# Patient Record
Sex: Female | Born: 1950 | Race: White | Hispanic: No | Marital: Married | Smoking: Never smoker
Health system: Southern US, Community
[De-identification: ages and names within clinical notes are randomized; demographics above are authoritative.]

## PROBLEM LIST (undated history)

## (undated) DIAGNOSIS — G8929 Other chronic pain: Secondary | ICD-10-CM

## (undated) DIAGNOSIS — E785 Hyperlipidemia, unspecified: Secondary | ICD-10-CM

## (undated) DIAGNOSIS — M542 Cervicalgia: Secondary | ICD-10-CM

## (undated) DIAGNOSIS — I10 Essential (primary) hypertension: Secondary | ICD-10-CM

## (undated) DIAGNOSIS — K5790 Diverticulosis of intestine, part unspecified, without perforation or abscess without bleeding: Secondary | ICD-10-CM

## (undated) DIAGNOSIS — N951 Menopausal and female climacteric states: Secondary | ICD-10-CM

## (undated) HISTORY — PX: TUBAL LIGATION: SHX77

## (undated) HISTORY — DX: Menopausal and female climacteric states: N95.1

## (undated) HISTORY — PX: HAND SURGERY: SHX662

## (undated) HISTORY — DX: Hyperlipidemia, unspecified: E78.5

## (undated) HISTORY — DX: Diverticulosis of intestine, part unspecified, without perforation or abscess without bleeding: K57.90

## (undated) HISTORY — DX: Other chronic pain: G89.29

## (undated) HISTORY — DX: Essential (primary) hypertension: I10

## (undated) HISTORY — DX: Cervicalgia: M54.2

---

## 1981-03-30 HISTORY — PX: APPENDECTOMY: SHX54

## 1981-03-30 HISTORY — PX: KNEE ARTHROSCOPY: SUR90

## 1984-03-30 HISTORY — PX: ABDOMINAL HYSTERECTOMY: SHX81

## 1993-03-30 HISTORY — PX: CHOLECYSTECTOMY: SHX55

## 1994-03-30 HISTORY — PX: HEEL SPUR SURGERY: SHX665

## 2009-01-08 ENCOUNTER — Encounter: Admission: RE | Admit: 2009-01-08 | Discharge: 2009-01-08 | Payer: Self-pay | Admitting: Family Medicine

## 2009-02-11 ENCOUNTER — Ambulatory Visit: Payer: Self-pay | Admitting: Family Medicine

## 2009-02-11 DIAGNOSIS — J45991 Cough variant asthma: Secondary | ICD-10-CM | POA: Insufficient documentation

## 2009-02-11 DIAGNOSIS — I10 Essential (primary) hypertension: Secondary | ICD-10-CM | POA: Insufficient documentation

## 2009-02-11 DIAGNOSIS — F324 Major depressive disorder, single episode, in partial remission: Secondary | ICD-10-CM | POA: Insufficient documentation

## 2009-02-18 ENCOUNTER — Telehealth: Payer: Self-pay | Admitting: Family Medicine

## 2009-03-26 ENCOUNTER — Ambulatory Visit: Payer: Self-pay | Admitting: Family Medicine

## 2009-03-27 ENCOUNTER — Telehealth (INDEPENDENT_AMBULATORY_CARE_PROVIDER_SITE_OTHER): Payer: Self-pay | Admitting: *Deleted

## 2009-04-01 ENCOUNTER — Telehealth: Payer: Self-pay | Admitting: Family Medicine

## 2009-04-12 ENCOUNTER — Encounter: Payer: Self-pay | Admitting: Family Medicine

## 2009-04-16 ENCOUNTER — Telehealth: Payer: Self-pay | Admitting: Family Medicine

## 2009-04-16 DIAGNOSIS — R05 Cough: Secondary | ICD-10-CM

## 2009-04-16 DIAGNOSIS — R059 Cough, unspecified: Secondary | ICD-10-CM | POA: Insufficient documentation

## 2009-04-17 ENCOUNTER — Encounter: Admission: RE | Admit: 2009-04-17 | Discharge: 2009-04-17 | Payer: Self-pay | Admitting: Family Medicine

## 2009-04-19 ENCOUNTER — Encounter: Payer: Self-pay | Admitting: Family Medicine

## 2009-04-23 ENCOUNTER — Ambulatory Visit: Payer: Self-pay | Admitting: Family Medicine

## 2009-05-03 ENCOUNTER — Telehealth: Payer: Self-pay | Admitting: Family Medicine

## 2009-05-03 ENCOUNTER — Encounter: Payer: Self-pay | Admitting: Family Medicine

## 2009-05-06 ENCOUNTER — Telehealth: Payer: Self-pay | Admitting: Family Medicine

## 2009-05-27 ENCOUNTER — Telehealth: Payer: Self-pay | Admitting: Family Medicine

## 2009-05-29 ENCOUNTER — Telehealth (INDEPENDENT_AMBULATORY_CARE_PROVIDER_SITE_OTHER): Payer: Self-pay | Admitting: *Deleted

## 2009-05-30 ENCOUNTER — Ambulatory Visit: Payer: Self-pay | Admitting: Critical Care Medicine

## 2009-05-30 ENCOUNTER — Encounter: Payer: Self-pay | Admitting: Family Medicine

## 2009-05-30 DIAGNOSIS — Z9109 Other allergy status, other than to drugs and biological substances: Secondary | ICD-10-CM | POA: Insufficient documentation

## 2009-05-30 DIAGNOSIS — J309 Allergic rhinitis, unspecified: Secondary | ICD-10-CM | POA: Insufficient documentation

## 2009-05-30 DIAGNOSIS — K219 Gastro-esophageal reflux disease without esophagitis: Secondary | ICD-10-CM | POA: Insufficient documentation

## 2009-05-31 ENCOUNTER — Encounter: Payer: Self-pay | Admitting: Critical Care Medicine

## 2009-07-04 ENCOUNTER — Telehealth: Payer: Self-pay | Admitting: Critical Care Medicine

## 2009-07-23 ENCOUNTER — Ambulatory Visit: Payer: Self-pay | Admitting: Family Medicine

## 2009-07-23 DIAGNOSIS — Z780 Asymptomatic menopausal state: Secondary | ICD-10-CM | POA: Insufficient documentation

## 2009-08-29 ENCOUNTER — Telehealth: Payer: Self-pay | Admitting: Family Medicine

## 2009-09-06 ENCOUNTER — Encounter: Payer: Self-pay | Admitting: Family Medicine

## 2009-10-08 ENCOUNTER — Encounter: Admission: RE | Admit: 2009-10-08 | Discharge: 2009-10-08 | Payer: Self-pay | Admitting: Family Medicine

## 2009-10-08 ENCOUNTER — Encounter: Payer: Self-pay | Admitting: Family Medicine

## 2009-10-16 ENCOUNTER — Telehealth: Payer: Self-pay | Admitting: Family Medicine

## 2009-10-16 DIAGNOSIS — R300 Dysuria: Secondary | ICD-10-CM | POA: Insufficient documentation

## 2009-10-17 ENCOUNTER — Encounter: Payer: Self-pay | Admitting: Family Medicine

## 2009-10-18 LAB — CONVERTED CEMR LAB
AST: 41 units/L — ABNORMAL HIGH (ref 0–37)
Albumin: 4.9 g/dL (ref 3.5–5.2)
Alkaline Phosphatase: 97 units/L (ref 39–117)
BUN: 41 mg/dL — ABNORMAL HIGH (ref 6–23)
Bilirubin Urine: NEGATIVE
Creatinine, Ser: 1.74 mg/dL — ABNORMAL HIGH (ref 0.40–1.20)
HDL: 54 mg/dL (ref >39)
Ketones, ur: NEGATIVE mg/dL
LDL Cholesterol: 162 mg/dL — ABNORMAL HIGH (ref 0–99)
Nitrite: NEGATIVE
Potassium: 4.2 meq/L (ref 3.5–5.3)
Specific Gravity, Urine: 1.013 (ref 1.005–1.030)
Urobilinogen, UA: 0.2 (ref 0.0–1.0)
VLDL: 25 mg/dL (ref 0–40)
pH: 5.5 (ref 5.0–8.0)

## 2009-11-01 ENCOUNTER — Telehealth: Payer: Self-pay | Admitting: Family Medicine

## 2009-11-06 ENCOUNTER — Telehealth (INDEPENDENT_AMBULATORY_CARE_PROVIDER_SITE_OTHER): Payer: Self-pay | Admitting: *Deleted

## 2009-11-07 ENCOUNTER — Encounter: Payer: Self-pay | Admitting: Family Medicine

## 2009-11-08 LAB — CONVERTED CEMR LAB
BUN: 24 mg/dL — ABNORMAL HIGH (ref 6–23)
Creatinine, Ser: 1.5 mg/dL — ABNORMAL HIGH (ref 0.40–1.20)

## 2009-11-27 ENCOUNTER — Telehealth: Payer: Self-pay | Admitting: Family Medicine

## 2009-11-29 ENCOUNTER — Ambulatory Visit: Payer: Self-pay | Admitting: Family Medicine

## 2009-11-29 DIAGNOSIS — R748 Abnormal levels of other serum enzymes: Secondary | ICD-10-CM | POA: Insufficient documentation

## 2009-11-29 DIAGNOSIS — E785 Hyperlipidemia, unspecified: Secondary | ICD-10-CM | POA: Insufficient documentation

## 2009-11-30 ENCOUNTER — Encounter: Payer: Self-pay | Admitting: Family Medicine

## 2009-12-03 LAB — CONVERTED CEMR LAB
HCV Ab: NEGATIVE
Hep B Core Total Ab: NEGATIVE

## 2010-01-15 ENCOUNTER — Ambulatory Visit: Payer: Self-pay | Admitting: Family Medicine

## 2010-01-16 ENCOUNTER — Telehealth: Payer: Self-pay | Admitting: Family Medicine

## 2010-01-16 LAB — CONVERTED CEMR LAB: Creatinine, Ser: 1.22 mg/dL — ABNORMAL HIGH (ref 0.40–1.20)

## 2010-02-24 ENCOUNTER — Telehealth: Payer: Self-pay | Admitting: Family Medicine

## 2010-03-11 ENCOUNTER — Ambulatory Visit: Payer: Self-pay | Admitting: Family Medicine

## 2010-04-15 ENCOUNTER — Encounter: Payer: Self-pay | Admitting: Family Medicine

## 2010-04-15 ENCOUNTER — Telehealth (INDEPENDENT_AMBULATORY_CARE_PROVIDER_SITE_OTHER): Payer: Self-pay | Admitting: *Deleted

## 2010-04-29 NOTE — Progress Notes (Signed)
Summary: Cramps  Phone Note Call from Patient   Caller: Patient Summary of Call: Pt states she gets horrible cramps in feet and hands occasionally. Pt wanted to know if she can get anything OTC to help w/ this. Please advise.  Initial call taken by: Payton Spark CMA,  May 06, 2009 3:32 PM  Follow-up for Phone Call        She can take an OTC Magnesium oxide (OTC ) 400 mg at night. Follow-up by: Seymour Bars DO,  May 06, 2009 3:42 PM     Appended Document: Cramps pt aware

## 2010-04-29 NOTE — Progress Notes (Signed)
Summary: Vicoprofen refills  Phone Note Refill Request Message from:  Patient on August 29, 2009 12:04 PM  Refills Requested: Medication #1:  VICOPROFEN 7.5-200 MG TABS Take 1 tab every 8 hours as needed for pain Initial call taken by: Payton Spark CMA,  August 29, 2009 12:04 PM    Prescriptions: VICOPROFEN 7.5-200 MG TABS (HYDROCODONE-IBUPROFEN) Take 1 tab every 8 hours as needed for pain  #90 x 0   Entered and Authorized by:   Seymour Bars DO   Signed by:   Seymour Bars DO on 08/29/2009   Method used:   Print then Give to Patient   RxID:   [id number]   Appended Document: Vicoprofen refills called in

## 2010-04-29 NOTE — Miscellaneous (Signed)
Summary: Spirometry Jan 2011   Pulmonary Function Test Date: 04/23/2009 Height (in.): 64 Gender: Female  Pre-Spirometry FVC    Value: 2.96 L/min   Pred: 3.03 L/min     % Pred: 97 % FEV1    Value: 2.36 L     Pred: 2.45 L     % Pred: 96 % FEV1/FVC  Value: 80 %     Pred: 80 %     % Pred: 100 % FEF 25-75  Value: 2.24 L/min   Pred: 2.63 L/min     % Pred: 85 %  Comments: normal spirometry Clinical Lists Changes  Observations: Added new observation of PFT COMMENTS: normal spirometry (05/31/2009 17:12) Added new observation of FEF % EXPEC: 85 % (05/31/2009 17:12) Added new observation of FEF25-75%PRE: 2.63 L/min (05/31/2009 17:12) Added new observation of FEF 25-75%: 2.24 L/min (05/31/2009 17:12) Added new observation of FEV1/FVC%EXP: 100 % (05/31/2009 17:12) Added new observation of FEV1/FVC PRE: 80 % (05/31/2009 17:12) Added new observation of FEV1/FVC: 80 % (05/31/2009 17:12) Added new observation of FEV1 % EXP: 96 % (05/31/2009 17:12) Added new observation of FEV1 PREDICT: 2.45 L (05/31/2009 17:12) Added new observation of FEV1: 2.36 L (05/31/2009 17:12) Added new observation of FVC % EXPECT: 97 % (05/31/2009 17:12) Added new observation of FVC PREDICT: 3.03 L (05/31/2009 17:12) Added new observation of FVC: 2.96 L (05/31/2009 17:12) Added new observation of PFT HEIGHT: 64  (05/31/2009 17:12) Added new observation of PFT DATE: 04/23/2009  (05/31/2009 17:12)

## 2010-04-29 NOTE — Assessment & Plan Note (Signed)
Summary: CPE   Vital Signs:  Patient profile:   60 year old female Menstrual status:  hysterectomy Height:      64 inches Weight:      184 pounds BMI:     31.70 O2 Sat:      99 % on Room air Pulse rate:   72 / minute BP sitting:   103 / 60  (left arm) Cuff size:   regular  Vitals Entered By: Payton Spark CMA (July 23, 2009 1:40 PM)  O2 Flow:  Room air CC: CPE     Menstrual Status hysterectomy   Primary Care Provider:  Seymour Bars DO  CC:  CPE.  History of Present Illness: 60 yo WF presents for CPE w/o pap smear.  She is G1P1 postmenopausal and had a TAH for endometriosis.  Denies any postmenopausal vaginal bleeding or discharge.  Denies a fam hx of breast or colon cancer or of premature heart dz.  Doing well on current meds, enrolled in a HTN trial.  She is trying to eat better, is walking more.  She is due for fasting labs, colonoscopy, DEXA and mammogram.  She denies CP or DOE.  Not a smoker.    Current Medications (verified): 1)  Vicoprofen 7.5-200 Mg Tabs (Hydrocodone-Ibuprofen) .... Take 1 Tab Every 8 Hours As Needed For Pain 2)  Clonidine Hcl 0.1 Mg Tabs (Clonidine Hcl) .... Take 1 Tab At Bedtime 3)  Chlorthalidone 25 Mg Tabs (Chlorthalidone) .... Once Daily 4)  Chlorpheniramine Maleate 12 Mg Cr-Tabs (Chlorpheniramine Maleate) .... Take One By Mouth Two Times A Day 5)  Diovan 80 Mg Tabs (Valsartan) .... Once Daily 6)  Mag-Ox 400 400 Mg Tabs (Magnesium Oxide) .... At Bedtime 7)  Tessalon Perles 100 Mg  Caps (Benzonatate) .... One To Two By Mouth 3-4 Times Daily Generic Please 8)  Omeprazole 20 Mg  Cpdr (Omeprazole) .... By Mouth Daily. Take One Half Hour Before Eating. 9)  Nasonex 50 Mcg/act  Susp (Mometasone Furoate) .... Two Puffs Each Nostril Daily  Allergies (verified): 1)  ! Pcn 2)  ! * Ivp Dye 3)  ! Codeine 4)  ! Priscoline 5)  ! * Latex  Past History:  Past Medical History: Reviewed history from 02/11/2009 and no changes required. menopausal  syndrome chronic neck pain -- Dr Orvan Falconer pain doctor HTN asthma high chol   last mammo 3-09  Past Surgical History: BTL R knee arthroscopy 1983 TAH w/o BSO 1986 for endometriosis R knee arthroscopy 1986, 1990 L foot heel spur 1996 appy 1983 lap chole 1995  Family History: Reviewed history from 05/30/2009 and no changes required. mother alive, HTN daughter HTN father died at 47, throat cancer brother healthy emphysema-PGF clotting disorders-mother  Social History: Reviewed history from 05/30/2009 and no changes required. 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 Married to [place] (2nd marriage). Has a grown daughter in [place] and a teenage disabled granddaughter with spina bifida. Never smoked.  Rare ETOH. No exercise. Fair diet.  Review of Systems  The patient denies anorexia, fever, weight loss, weight gain, vision loss, decreased hearing, hoarseness, chest pain, syncope, dyspnea on exertion, peripheral edema, prolonged cough, headaches, hemoptysis, abdominal pain, melena, hematochezia, severe indigestion/heartburn, hematuria, incontinence, genital sores, muscle weakness, suspicious skin lesions, transient blindness, difficulty walking, depression, unusual weight change, abnormal bleeding, enlarged lymph nodes, angioedema, breast masses, and testicular masses.    Physical Exam  General:  alert, well-developed, well-nourished, well-hydrated, and overweight-appearing.   Head:  normocephalic and  atraumatic.   Eyes:  pupils equal, pupils round, and pupils reactive to light.   Ears:  no external deformities.   Nose:  no nasal discharge.   Mouth:  good dentition and pharynx pink and moist.   Neck:  no masses.   Breasts:  No mass, nodules, thickening, tenderness, bulging, retraction, inflamation, nipple discharge or skin changes noted.   Lungs:  Normal respiratory effort, chest expands symmetrically. Lungs are clear to auscultation, no crackles  or wheezes. Heart:  Normal rate and regular rhythm. S1 and S2 normal without gallop, murmur, click, rub or other extra sounds. Abdomen:  Bowel sounds positive,abdomen soft and non-tender without masses, organomegaly or hernias noted. Pulses:  2+ radial and pedal pulses Extremities:  no E/C/C Neurologic:  gait normal.   Skin:  color normal.   Cervical Nodes:  No lymphadenopathy noted Psych:  good eye contact, not anxious appearing, and not depressed appearing.     Impression & Recommendations:  Problem # 1:  Preventive Health Care (ICD-V70.0) Keeping healthy checklist for women reviewed. BP at goal on current meds. Update labs 6-1 (waiting for health insurance). Update mammogram, DEXA and colonoscopy. Tdap due next yr. BMI 31 c/w class I obesity.  Work on [employer], regular exercise, wt loss, MVI and calcium/ D.  Return in 6 mos for f/u HTN.  Complete Medication List: 1)  Vicoprofen 7.5-200 Mg Tabs (Hydrocodone-ibuprofen) .... Take 1 tab every 8 hours as needed for pain 2)  Clonidine Hcl 0.1 Mg Tabs (Clonidine hcl) .... Take 1 tab at bedtime 3)  Chlorthalidone 25 Mg Tabs (Chlorthalidone) .... Once daily 4)  Chlorpheniramine Maleate 12 Mg Cr-tabs (Chlorpheniramine maleate) .... Take one by mouth two times a day 5)  Diovan 80 Mg Tabs (Valsartan) .... Once daily 6)  Mag-ox 400 400 Mg Tabs (Magnesium oxide) .... At bedtime 7)  Tessalon Perles 100 Mg Caps (Benzonatate) .... One to two by mouth 3-4 times daily generic please 8)  Omeprazole 20 Mg Cpdr (Omeprazole) .... By mouth daily. take one half hour before eating. 9)  Nasonex 50 Mcg/act Susp (Mometasone furoate) .... Two puffs each nostril daily  Other Orders: Gastroenterology Referral (GI) T-Mammography Bilateral Screening (09811) T-DXA Bone Density/ Appendicular [phone]) T-Dual DXA Bone Density/ Axial (29562) T-Comprehensive Metabolic Panel ([id number]) T-Lipid Profile ([id number]) Prescriptions: CLONIDINE HCL 0.1 MG TABS  (CLONIDINE HCL) Take 1 tab at bedtime  #30 x 5   Entered and Authorized by:   Seymour Bars DO   Signed by:   Seymour Bars DO on 07/23/2009   Method used:   Electronically to        [facility] (retail)       [address]       [place], [place]  [address]       Ph: [phone]       Fax: [phone]   RxID:   [id number]    Tetanus/Td Immunization History:    Tetanus/Td # 1:  Historical (03/30/2000)

## 2010-04-29 NOTE — Progress Notes (Signed)
Summary: Vicoprofen Refill and Advair not helping  Phone Note Refill Request   Refills Requested: Medication #1:  VICOPROFEN 7.5-200 MG TABS Take 1 tab every 8 hours as needed for pain Initial call taken by: Payton Spark CMA,  April 01, 2009 11:29 AM Caller: Patient Summary of Call: Pt states the advair is not helping any longer and would like a different Rx.  Initial call taken by: Payton Spark CMA,  April 01, 2009 11:29 AM  Follow-up for Phone Call        I changed her Advair to Qvar.  Use twice daily and rinse mouth out after each use.  Call if not seeing improvements in 10 days. Follow-up by: Seymour Bars DO,  April 01, 2009 11:45 AM    New/Updated Medications: QVAR 80 MCG/ACT AERS (BECLOMETHASONE DIPROPIONATE) 1 inhalation two times a day Prescriptions: QVAR 80 MCG/ACT AERS (BECLOMETHASONE DIPROPIONATE) 1 inhalation two times a day  #1 x 1   Entered and Authorized by:   Seymour Bars DO   Signed by:   Seymour Bars DO on 04/01/2009   Method used:   Electronically to        [facility] (retail)       [address]       [place], [place]  [address]       Ph: [phone]       Fax: [phone]   RxID:   [id number] VICOPROFEN 7.5-200 MG TABS (HYDROCODONE-IBUPROFEN) Take 1 tab every 8 hours as needed for pain  #90 x 0   Entered and Authorized by:   Seymour Bars DO   Signed by:   Seymour Bars DO on 04/01/2009   Method used:   Printed then faxed to ...       [facility] (retail)       [address]       [place], [place]  [address]       Ph: [phone]       Fax: [phone]   RxID:   [id number]   Appended Document: Vicoprofen Refill and Advair not helping Pt aware

## 2010-04-29 NOTE — Progress Notes (Signed)
Summary: Lab order   

## 2010-04-29 NOTE — Assessment & Plan Note (Signed)
Summary: PFTs   Vital Signs:  Patient profile:   60 year old female Height:      63 inches Weight:      186 pounds BMI:     33.07 O2 Sat:      99 % on Room air Temp:     98.4 degrees F oral Pulse rate:   61 / minute BP sitting:   116 / 66  (left arm) Cuff size:   large  Vitals Entered By: Payton Spark CMA (April 23, 2009 2:33 PM)  O2 Flow:  Room air CC: Spirometry   Primary Care Provider:  Seymour Bars DO  CC:  Spirometry.  History of Present Illness: 60 yo WF presents for PFTs.  Her test results are normal.  She is in a study thru [facility] and she is on an ARB with this study.  She had labs done thru them.  She is doing better on Qvar two times a day.  She has not needed her Albuterol in the past 5 days.  She was taking Bactrim that she had at home and she claims that her cough did improve.  Her CXR was normal though.     Allergies: 1)  ! Pcn 2)  ! * Ivp Dye 3)  ! Codeine 4)  ! Priscoline 5)  ! * Latex  Past History:  Past Medical History: Reviewed history from 02/11/2009 and no changes required. menopausal syndrome chronic neck pain -- Dr Orvan Falconer pain doctor HTN asthma high chol   last mammo 3-09  Social History: Reviewed history from 02/11/2009 and no changes required. 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  No health insurance. Finished HS. Married to [place] (2nd marriage). Has a grown daughter in [place] and a teenage disabled granddaughter with spina bifida. Never smoked.  Rare ETOH. No exercise. Fair diet.  Review of Systems      See HPI  Physical Exam  General:  alert, well-developed, well-nourished, well-hydrated, and overweight-appearing.   Lungs:  Normal respiratory effort, chest expands symmetrically. Lungs are clear to auscultation, no crackles or wheezes.  Heart:  Normal rate and regular rhythm. S1 and S2 normal without gallop, murmur, click, rub or other extra sounds. Skin:  color normal.   Psych:  good eye contact, not anxious appearing, and  not depressed appearing.     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  Her FVC went from 97% predicted to 139% predicted after albuterol.  Her FEV1 went from 96% to 148% and her FEV1/FVC from 80 --> 86.  Symptomatically, the Qvar has helped.  I also think that adding Claritin daily has helped with the underlying cause of her asthma which is allergies.    Continue current meds.  If she flares again, I will get her in with allergist. We reviewed her normal CXR today.  I am not in support of her taking RX abx that she has 'leftover' for her cough. Her updated medication list for this problem includes:    Proair Hfa 108 (90 Base) Mcg/act Aers (Albuterol sulfate) .Marland Kitchen... 2 puffs q 4 hrs prn    Qvar 80 Mcg/act Aers (Beclomethasone dipropionate) .Marland Kitchen... 1 inhalation two times a day  Orders: Spirometry (Pre & Post) (94060) Albuterol Sulfate Sol 1mg  unit dose (U0454)  Complete Medication List: 1)  Vicoprofen 7.5-200 Mg Tabs (Hydrocodone-ibuprofen) .... Take 1 tab every 8 hours as needed for pain 2)  Clonidine Hcl 0.1  Mg Tabs (Clonidine hcl) .... Take 1 tab at bedtime 3)  Hydrochlorothiazide 25 Mg Tabs (Hydrochlorothiazide) .Marland Kitchen.. 1 tab by mouth daily 4)  Proair Hfa 108 (90 Base) Mcg/act Aers (Albuterol sulfate) .... 2 puffs q 4 hrs prn 5)  Claritin 10 Mg Tabs (Loratadine) .Marland Kitchen.. 1 tab by mouth daily 6)  Qvar 80 Mcg/act Aers (Beclomethasone dipropionate) .Marland Kitchen.. 1 inhalation two times a day  Patient Instructions: 1)  Stay on current meds. 2)  PFTs look great. 3)  Return for a complete physical in 3 months.

## 2010-04-29 NOTE — Progress Notes (Signed)
Summary: Pain meds  Phone Note Call from Patient   Caller: Patient Summary of Call: Pt states she brought the written script to our office to destroy. Pt does not want to go back to Dr. Orvan Falconer and thought you were going to take over pain meds. Please advise.  Initial call taken by: Payton Spark CMA,  May 29, 2009 3:45 PM  Follow-up for Phone Call        see previous note in EMR from 05-03-09.  She will need to fill out a narcotic contract if she wants me to fill this RX on a regular basis. She called here on 2-4 stating that Dr Orvan Falconer was writing her scripts.  Please clarify.   Follow-up by: Seymour Bars DO,  May 29, 2009 3:51 PM  Additional Follow-up for Phone Call Additional follow up Details #1::        Pt states that she called on 05-03-09 to inform you that she was no longer seeing Dr. Orvan Falconer and wanted to take over pain meds. Pt then states she brought written Rx from Dr. Orvan Falconer here to let you know that she wouldn't be filling his Rxs anymore.   Pt agrees to do the above Additional Follow-up by: Payton Spark CMA,  May 29, 2009 4:03 PM

## 2010-04-29 NOTE — Medication Information (Signed)
Summary: Vicoprofen Rx from [facility]  Vicoprofen Rx from [facility]   Imported By: Lanelle Bal 05/10/2009 11:16:12  _____________________________________________________________________  External Attachment:    Type:   Image     Comment:   External Document

## 2010-04-29 NOTE — Letter (Signed)
Summary: PHQ-9 Sheet/[facility]  [facility] Questionaire   Imported By: Sherian Rein 01/28/2010 10:30:40  _____________________________________________________________________  External Attachment:    Type:   Image     Comment:   External Document

## 2010-04-29 NOTE — Miscellaneous (Signed)
Summary: normal colonoscopy  Clinical Lists Changes  Observations: Added new observation of COLONRECACT: Repeat colonoscopy in 10 years.   (09/06/2009 13:00) Added new observation of COLONOSCOPY: Location:  [facility].   Results: Normal. Results: Diverticulosis.          (09/06/2009 13:00)      Colonoscopy  Procedure date:  09/06/2009  Findings:      Location:  [facility].   Results: Normal. Results: Diverticulosis.           Comments:      Repeat colonoscopy in 10 years.     Colonoscopy  Procedure date:  09/06/2009  Findings:      Location:  [facility].   Results: Normal. Results: Diverticulosis.           Comments:      Repeat colonoscopy in 10 years.

## 2010-04-29 NOTE — Progress Notes (Signed)
Summary: Missed Appt due to Front office /delayed clinic issues  Phone Note Outgoing Call   Reason for Call: Confirm/change Appt Summary of Call: Call the pt, apologize for the fact noone at front desk helped her,  find out if she will reschedule, find out if she is any better. Give me report  Initial call taken by: Storm Frisk MD,  July 04, 2009 12:02 PM  Follow-up for Phone Call        called, spoke with pt.  Apologized for miscommunication this morning and lack of pt help-pt very plesant and understanding.  Pt states breathing is doing well, states cough has resolved, nasal pasages aren't as dry and have not been bleeding as much.  Pt states she is using nasal spray and nasal washes which are helping.  Denies any complaints/concerns for PW at this time.  Pt declines scheduling f/u at this time and states she will call back to schedule ov.  Will forward to PW.   Follow-up by: Gweneth Dimitri RN,  July 04, 2009 2:19 PM  Additional Follow-up for Phone Call Additional follow up Details #1::        noted and thanks  Additional Follow-up by: Storm Frisk MD,  July 04, 2009 8:40 PM

## 2010-04-29 NOTE — Progress Notes (Signed)
Summary: Vicoprofen refill  Phone Note Refill Request   Refills Requested: Medication #1:  VICOPROFEN 7.5-200 MG TABS Take 1 tab every 8 hours as needed for pain Initial call taken by: Payton Spark CMA,  February 24, 2010 9:22 AM    Prescriptions: VICOPROFEN 7.5-200 MG TABS (HYDROCODONE-IBUPROFEN) Take 1 tab every 8 hours as needed for pain  #90 x 0   Entered and Authorized by:   Seymour Bars DO   Signed by:   Seymour Bars DO on 02/24/2010   Method used:   Printed then faxed to ...       [facility] (retail)       [address]       [place], [place]  [address]       Ph: [phone]       Fax: [phone]   RxID:   [id number]

## 2010-04-29 NOTE — Progress Notes (Signed)
Summary: Chest pain. Advised ED  Phone Note Call from Patient   Caller: Patient Summary of Call: Pt called stating she had sudden onset of severe chest pain radiating to back. BP is 141/90 and pulse is 46. I advised Pt to have husband take her directly to ED. Pt verbalized understanding and agreed to go to ED.  Initial call taken by: Payton Spark CMA,  November 01, 2009 3:01 PM  Follow-up for Phone Call        agree. Follow-up by: Seymour Bars DO,  November 01, 2009 3:16 PM

## 2010-04-29 NOTE — Miscellaneous (Signed)
Summary: Controlled Substance Agreement/[name]  Controlled Substance Agreement/ [facility]   Imported By: Lanelle Bal 06/04/2009 13:16:51  _____________________________________________________________________  External Attachment:    Type:   Image     Comment:   External Document

## 2010-04-29 NOTE — Assessment & Plan Note (Signed)
Summary: Pulmonary Consultation   Copy to:  Seymour Bars Primary Provider/Referring Provider:  Seymour Bars DO  CC:  Pulmonary Consult for cough-states it's a dry cough and getting progressive worse since December.  .  History of Present Illness: Pulmonary Consutlation    This is a 60 year old female who presents with cough.  The patient complains of shortness of breath, chest tightness, wheezing, cough, and exercise induced symptoms, but denies history of diagnosed COPD, chest pain worse with breathing and coughing, mucous production, nocturnal awakening, and congestion.  The cough is described as non-productive, perceived to be from upper airway, does not interrupt sleep, does not occur after eating, worse with exercise, worse with stress/anxiety, and with wheezing noted.  The dyspnea is described as insignificant.  The patient reports a history of asthma.  The patient denies any history of allergic rhinitis, COPD, sleep disordered breathing, obstructive sleep apnea, heart disease, thyroid disease, anemia, collagen vascular disease, osteporosis, kyphoscoliosis, occupational exposure: , and cancer: .  Prior effective therapies include beta agonists.  Ineffective therapies have included oral inhaled steroids.    This pt was dx age 60 asthma.  Pt noting in Jan 2011 more dyspnea and cough despite inhalers.  Cough is dry.  Cough is in upper airway.  Rx with Qvar since 04/28/09.  On proair for years and uses asneeded but now more use lately.  Occ indigestion and heartburn.  Lifelong never smoker.  Has pndrip.  Not hoarse. Pt denies sore throat.     Preventive Screening-Counseling & Management  Alcohol-Tobacco     Smoking Status: never  Current Medications (verified): 1)  Vicoprofen 7.5-200 Mg Tabs (Hydrocodone-Ibuprofen) .... Take 1 Tab Every 8 Hours As Needed For Pain 2)  Clonidine Hcl 0.1 Mg Tabs (Clonidine Hcl) .... Take 1 Tab At Bedtime 3)  Chlorthalidone 25 Mg Tabs (Chlorthalidone) .... Once  Daily 4)  Proair Hfa 108 (90 Base) Mcg/act Aers (Albuterol Sulfate) .... 2 Puffs Q 4 Hrs Prn 5)  Claritin 10 Mg Tabs (Loratadine) .Marland Kitchen.. 1 Tab By Mouth Daily 6)  Qvar 80 Mcg/act Aers (Beclomethasone Dipropionate) .Marland Kitchen.. 1 Inhalation Two Times A Day 7)  Diovan 80 Mg Tabs (Valsartan) .... Once Daily 8)  Mag-Ox 400 400 Mg Tabs (Magnesium Oxide) .... At Bedtime  Allergies (verified): 1)  ! Pcn 2)  ! * Ivp Dye 3)  ! Codeine 4)  ! Priscoline 5)  ! * Latex  Past History:  Past medical, surgical, family and social histories (including risk factors) reviewed, and no changes noted (except as noted below).  Past Medical History: Reviewed history from 02/11/2009 and no changes required. menopausal syndrome chronic neck pain -- Dr Orvan Falconer pain doctor HTN asthma high chol   last mammo 3-09  Past Surgical History: Reviewed history from 02/11/2009 and no changes required. BTL R knee arthroscopy 1983 TAH w/o BSO 1986 for fibroids R knee arthroscopy 1986, 1990 L foot heel spur 1996 appy 1983 lap chole 1995  Past Pulmonary History:  Pulmonary History: Pulmonary Function Test  Date: 04/23/2009 Height (in.): 64 Gender: Female  Pre-Spirometry  FVC     Value: 2.96 L/min   Pred: 3.03 L/min     % Pred: 97 % FEV1     Value: 2.36 L     Pred: 2.45 L     % Pred: 96 % FEV1/FVC   Value: 80 %     Pred: 80 %     % Pred: 100 % FEF 25-75   Value: 2.24 L/min  Pred: 2.63 L/min     % Pred: 85 %  Comments:  normal spirometry  Family History: Reviewed history from 02/11/2009 and no changes required. mother alive, HTN daughter HTN father died at 83, throat cancer brother healthy emphysema-PGF clotting disorders-mother  Social History: Reviewed history from 02/11/2009 and no changes required. 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 Married to [place] (2nd marriage). Has a grown daughter in [place] and a teenage disabled granddaughter with spina bifida. Never  smoked.  Rare ETOH. No exercise. Fair diet.Smoking Status:  never  Review of Systems       The patient complains of non-productive cough, acid heartburn, indigestion, difficulty swallowing, headaches, nasal congestion/difficulty breathing through nose, and anxiety.  The patient denies shortness of breath with activity, shortness of breath at rest, productive cough, coughing up blood, chest pain, irregular heartbeats, loss of appetite, weight change, abdominal pain, sore throat, tooth/dental problems, sneezing, itching, ear ache, depression, hand/feet swelling, joint stiffness or pain, rash, change in color of mucus, and fever.        See HPI for Pulmonary, Cardiac, General, and ENT review of systems.  Vital Signs:  Patient profile:   60 year old female Height:      64 inches Weight:      185 pounds BMI:     31.87 O2 Sat:      97 % on Room air Temp:     98.5 degrees F oral Pulse rate:   90 / minute BP sitting:   134 / 86  (left arm) Cuff size:   regular  Vitals Entered By: Gweneth Dimitri RN (May 30, 2009 8:50 AM)  O2 Flow:  Room air CC: Pulmonary Consult for cough-states it's a dry cough and getting progressive worse since December.   Comments Medications reviewed with patient Daytime contact number verified with patient. Gweneth Dimitri RN  May 30, 2009 8:50 AM    Physical Exam  Additional Exam:  Gen: Pleasant, well-nourished, in no distress,  normal affect ENT: moderate rhinitis,  mouth clear,  oropharynx clear, mild postnasal drip Neck: No JVD, no TMG, no carotid bruits Lungs: No use of accessory muscles, no dullness to percussion, insp/exp wheeze, poor airflow Cardiovascular: RRR, heart sounds normal, no murmur or gallops, no peripheral edema Abdomen: soft and NT, no HSM,  BS normal Musculoskeletal: No deformities, no cyanosis or clubbing Neuro: alert, non focal Skin: Warm, no lesions or rashes    CXR  Procedure date:  04/17/2009  Findings:      IMPRESSION: No  acute findings.    Impression & Recommendations:  Problem # 1:  ATOPIC RHINITIS (ICD-477.9) Assessment Deteriorated Severe atopic rhinitis with postnasal drip syndrome and cyclic cough plan nasal rinse nasonex chlorpheniramine  Her updated medication list for this problem includes:    Chlorpheniramine Maleate 12 Mg Cr-tabs (Chlorpheniramine maleate) .Marland Kitchen... 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 plan reflux diet omeprazole daily   Medications Added to Medication List This Visit: 1)  Chlorthalidone 25 Mg Tabs (Chlorthalidone) .... Once daily 2)  Chlorpheniramine Maleate 12 Mg Cr-tabs (Chlorpheniramine maleate) .... Take one by mouth two  times a day 3)  Diovan 80 Mg Tabs (Valsartan) .... Once daily 4)  Mag-ox 400 400 Mg Tabs (Magnesium oxide) .... At bedtime 5)  Tessalon Perles 100 Mg Caps (Benzonatate) .... One to two by mouth 3-4 times daily generic please 6)  Tussicaps 10-8 Mg Xr12h-cap (Hydrocod polst-chlorphen polst) .... One by mouth two times a day as needed cough 7)  Omeprazole 20 Mg Cpdr (Omeprazole) .... By mouth daily. take one half hour before eating. 8)  Nasonex 50 Mcg/act Susp (Mometasone furoate) .... Two puffs each nostril daily  Complete Medication List: 1)  Vicoprofen 7.5-200 Mg Tabs (Hydrocodone-ibuprofen) .... Take 1 tab every 8 hours as needed for pain 2)  Clonidine Hcl 0.1 Mg Tabs (Clonidine hcl) .... Take 1 tab at bedtime 3)  Chlorthalidone 25 Mg Tabs (Chlorthalidone) .... Once daily 4)  Proair Hfa 108 (90 Base) Mcg/act Aers (Albuterol sulfate) .... 2 puffs q 4 hrs prn 5)  Chlorpheniramine  Maleate 12 Mg Cr-tabs (Chlorpheniramine maleate) .... Take one by mouth two times a day 6)  Diovan 80 Mg Tabs (Valsartan) .... Once daily 7)  Mag-ox 400 400 Mg Tabs (Magnesium oxide) .... At bedtime 8)  Tessalon Perles 100 Mg Caps (Benzonatate) .... One to two by mouth 3-4 times daily generic please 9)  Tussicaps 10-8 Mg Xr12h-cap (Hydrocod polst-chlorphen polst) .... One by mouth two times a day as needed cough 10)  Omeprazole 20 Mg Cpdr (Omeprazole) .... By mouth daily. take one half hour before eating. 11)  Nasonex 50 Mcg/act Susp (Mometasone furoate) .... Two puffs each nostril daily  Patient Instructions: 1)  Use the NeilMed nasal rinse at least daily washing out both nares thoroughly.  Place one packet of Sinus Wash ingredients into the nasal wash bottle then fill to the dotted line with lukewarm tap water.  Lean over the sink and rinse each nostril out thoroughly and avoid letting the rinse go into the throat.   2)  Nasonex two sprays twice a day 3)  Omeprazole one daily 1/2 hour before a meal, then eat 4)  Reflux diet 5)  Stop Qvar 6)  Cyclic cough protocol with tussicaps and tessalon perles 7)  Keep sugar free lozenge in mouth at all times, swallow, do not cough or clear throat,  no peppermint or cough drops 8)  Chlorpheniramine twice a day 9)  Stop clariten 10)  Return one month [place] office Prescriptions: NASONEX 50 MCG/ACT  SUSP (MOMETASONE FUROATE) Two puffs each nostril daily  #1 x 1   Entered and Authorized by:   Storm Frisk MD   Signed by:   Storm Frisk MD on 05/30/2009   Method used:   Electronically to        [facility] (retail)       [address]       [place], [place]  [address]       Ph: [phone]       Fax: [phone]   RxID:   [id number] OMEPRAZOLE 20 MG  CPDR (OMEPRAZOLE) By mouth daily. Take one half hour before eating.  #30 x 1   Entered and Authorized by:   Storm Frisk MD   Signed by:   Storm Frisk MD on 05/30/2009   Method  used:   Electronically to        [facility]* (retail)       [address]       [place], [place]  [address]       Ph:  [phone]       Fax: [phone]   RxID:   [id number] TUSSICAPS 10-8 MG XR12H-CAP (HYDROCOD POLST-CHLORPHEN POLST) one by mouth two times a day as needed cough  #20 x 1   Entered and Authorized by:   Storm Frisk MD   Signed by:   Storm Frisk MD on 05/30/2009   Method used:   Print then Give to Patient   RxID:   [id number] TESSALON PERLES 100 MG  CAPS (BENZONATATE) One to two by mouth 3-4 times daily generic please  #90 x 6   Entered and Authorized by:   Storm Frisk MD   Signed by:   Storm Frisk MD on 05/30/2009   Method used:   Electronically to        [facility] (retail)       [address]       [place], [place]  [address]       Ph: [phone]       Fax: [phone]   RxID:   [id number] CHLORPHENIRAMINE MALEATE 12 MG CR-TABS (CHLORPHENIRAMINE MALEATE) take one by mouth two times a day  #60 x 6   Entered and Authorized by:   Storm Frisk MD   Signed by:   Storm Frisk MD on 05/30/2009   Method used:   Electronically to        [facility] (retail)       [address]       [place], [place]  [address]       Ph: [phone]       Fax: [phone]   RxID:   [id number]    Immunization History:  Influenza Immunization History:    Influenza:  historical (12/28/1984)

## 2010-04-29 NOTE — Assessment & Plan Note (Signed)
Summary: depression   Vital Signs:  Patient profile:   60 year old female Menstrual status:  hysterectomy Height:      64 inches Weight:      173 pounds BMI:     29.80 O2 Sat:      99 % on Room air Pulse rate:   70 / minute BP sitting:   153 / 80  (left arm) Cuff size:   regular  Vitals Entered By: Payton Spark CMA (January 15, 2010 1:34 PM)  O2 Flow:  Room air CC: ? Depression   Primary Care Provider:  Seymour Bars DO  CC:  ? Depression.  History of Present Illness: Tracy Massey presents for problems with depression.  She reports being depressed x 6 months.  She stopped meds about 2 yrs ago.  It is affecting her relationship.  She is not wanting to do anything.   She reports that her mood is expecting her job.  She is tired and wants to be by herself.  She is sleeping more than usual.  She is more anxious and irritable.    Current Medications (verified): 1)  Vicoprofen 7.5-200 Mg Tabs (Hydrocodone-Ibuprofen) .... Take 1 Tab Every 8 Hours As Needed For Pain 2)  Clonidine Hcl 0.1 Mg Tabs (Clonidine Hcl) .... Take 1 Tab At Bedtime 3)  Mag-Ox 400 400 Mg Tabs (Magnesium Oxide) .... At Bedtime 4)  Nasonex 50 Mcg/act  Susp (Mometasone Furoate) .... Two Puffs Each Nostril Daily 5)  Pravastatin Sodium 40 Mg Tabs (Pravastatin Sodium) .Marland Kitchen.. 1 Tab By Mouth Qhs 6)  Carvedilol 12.5 Mg Tabs (Carvedilol) .Marland Kitchen.. 1 Tab By Mouth Bid 7)  Diovan 80 Mg Tabs (Valsartan) .Marland Kitchen.. 1 Tab By Mouth Daily  Allergies (verified): 1)  ! Pcn 2)  ! * Ivp Dye 3)  ! Codeine 4)  ! Priscoline 5)  ! * Latex  Past History:  Past Medical History: Reviewed history from 02/11/2009 and no changes required. menopausal syndrome chronic neck pain -- Dr Orvan Falconer pain doctor HTN asthma high chol   last mammo 3-09  Past Surgical History: Reviewed history from 07/23/2009 and no changes required. BTL R knee arthroscopy 1983 TAH w/o BSO 1986 for endometriosis R knee arthroscopy 1986, 1990 L foot heel spur  1996 appy 1983 lap chole 1995  Social History: Reviewed history from 05/30/2009 and no changes required. 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 Has a grown daughter in [place] and a teenage disabled granddaughter with spina bifida. Never smoked.  Rare ETOH. No exercise. Fair diet.  Review of Systems Psych:  Complains of anxiety, depression, easily angered, easily tearful, irritability, and suicidal thoughts/plans; denies panic attacks; she verbally commits that she will not harm herself.  .  Physical Exam  Psych:  depressed affect and poor eye contact.     Impression & Recommendations:  Problem # 1:  DEPRESSION (ICD-311) PHQ 9 score of 19 = severe depression.  Verbally committs that she will not harm herslef.  Will consider counseling.  STart Fluoxetine.  Did well on this in the past, start w/ 10 mg/ day x 1 wk then go up to 20 mg.  Call if any prbolems including worsening depression or suicidal thoughts.  RTC in 6 wks for f/u. Her updated medication list for this problem includes:    Fluoxetine Hcl 20 Mg Tabs (Fluoxetine hcl) .Marland Kitchen... 1 tab by mouth qam  Problem # 2:  ESSENTIAL HYPERTENSION, BENIGN (ICD-401.1) In  a research study for HTN. Her updated medication list for this problem includes:    Clonidine Hcl 0.1 Mg Tabs (Clonidine hcl) .Marland Kitchen... Take 1 tab at bedtime    Carvedilol 12.5 Mg Tabs (Carvedilol) .Marland Kitchen... 1 tab by mouth bid    Diovan 80 Mg Tabs (Valsartan) .Marland Kitchen... 1 tab by mouth daily  Orders: T-BUN ([address]-60454) T-Creatinine Blood (09811-91478)  BP today: 153/80 Prior BP: 153/77 (11/29/2009)  Labs Reviewed: K+: 4.2 (10/17/2009) Creat: : 1.50 (11/07/2009)   Chol: 241 (10/17/2009)   HDL: 54 (10/17/2009)   LDL: 162 (10/17/2009)   TG: 126 (10/17/2009)  Complete Medication List: 1)  Vicoprofen 7.5-200 Mg Tabs (Hydrocodone-ibuprofen) .... Take 1 tab every 8 hours as needed for pain 2)  Clonidine Hcl 0.1 Mg Tabs  (Clonidine hcl) .... Take 1 tab at bedtime 3)  Mag-ox 400 400 Mg Tabs (Magnesium oxide) .... At bedtime 4)  Nasonex 50 Mcg/act Susp (Mometasone furoate) .... Two puffs each nostril daily 5)  Pravastatin Sodium 40 Mg Tabs (Pravastatin sodium) .Marland Kitchen.. 1 tab by mouth qhs 6)  Carvedilol 12.5 Mg Tabs (Carvedilol) .Marland Kitchen.. 1 tab by mouth bid 7)  Diovan 80 Mg Tabs (Valsartan) .Marland Kitchen.. 1 tab by mouth daily 8)  Fluoxetine Hcl 20 Mg Tabs (Fluoxetine hcl) .Marland Kitchen.. 1 tab by mouth qam  Patient Instructions: 1)  Start Fluoxetine once daily for mood. 2)  CUT IN HALF FOR THE FIRST WK THEN GO UP TO A FULL TAB. 3)  Call me if any problems. 4)  Return for follow up in 6 wks.   Prescriptions: FLUOXETINE HCL 20 MG TABS (FLUOXETINE HCL) 1 tab by mouth qAM  #30 x 3   Entered and Authorized by:   Seymour Bars DO   Signed by:   Seymour Bars DO on 01/15/2010   Method used:   Electronically to        [facility] (retail)       [address]       [place], [place]  [address]       Ph: [phone]       Fax: [phone]   RxID:   [id number]    Orders Added: 1)  T-BUN [[id number]] 2)  T-Creatinine Blood [[id number]] 3)  Est. Patient Level III [18841]

## 2010-04-29 NOTE — Progress Notes (Signed)
Summary: Tracy Massey  Phone Note Call from Patient Call back at [facility] Phone [phone]   Caller: Patient Summary of Call: PT CALLED WANTED DR Sarie Stall TO KNOW SEEING DR CAMPBELL PAIN MGMT, DR Saachi Zale IS TAKING OVER MY RX FOR VICOPROFEN, WANTED DR Giavanni Zeitlin KNOW DR CAMPBELL  WROTE A RX FOR ME FOR VICOPROFEN FOR #90 WITH 2 REFILL. I HAVE RX AND WILL BRING IN ON MONDAY, DID NOT WANT  DR Yoshiharu Brassell TO THINK I WAS DOUBLE DIPPING Initial call taken by: Kandice Hams,  May 03, 2009 3:47 PM  Follow-up for Phone Call        ok. thanks. Follow-up by: Seymour Bars DO,  May 03, 2009 8:18 PM

## 2010-04-29 NOTE — Progress Notes (Signed)
Summary: Fluoxetine  Phone Note Call from Patient Call back at cell# [phone]   Caller: Patient Call For: Tracy Bars DO Summary of Call: Pt started on Fluoxetine 20mg  yesterday and instructed to take 1/2 tab for 1 week then increase to whole tab. Pt states it is in a capsule and can n ot split it so do you want her to take one whole tab daily or what Initial call taken by: Kathlene November LPN,  January 16, 2010 11:14 AM  Follow-up for Phone Call        hmm... I wrote her for the tabs specifically, so I think the pharmacy is at fault.  She should be fine taking the full capusle daily to start. Follow-up by: Tracy Bars DO,  January 17, 2010 11:40 AM     Appended Document: Fluoxetine 01/17/2010 @ 2:28pm- Pt notified of MD instructions. KJ LPN

## 2010-04-29 NOTE — Assessment & Plan Note (Signed)
Summary: f/u BP/ chol   Vital Signs:  Patient profile:   60 year old female Menstrual status:  hysterectomy Height:      64 inches Weight:      177 pounds BMI:     30.49 O2 Sat:      99 % on Room air Pulse rate:   57 / minute BP sitting:   153 / 77  (left arm) Cuff size:   regular  Vitals Entered By: Payton Spark CMA (November 29, 2009 1:33 PM)  O2 Flow:  Room air CC: F/U HTN   Primary Care Provider:  Seymour Bars DO  CC:  F/U HTN.  History of Present Illness: 60 yo WF presents for f/u HTN with chronic stage II CKD, improved some off chlorthalidone and Diovan but now BP is high.  Denies CP, edema, palpitations or SOB.  Lost 7 lbs by imrpvoing lifestyle factors.  Due to recheck liver enzymes.  has not had RUQ u/s done yet.  REcenlty start Pravastatin for high cholesterol.    Current Medications (verified): 1)  Vicoprofen 7.5-200 Mg Tabs (Hydrocodone-Ibuprofen) .... Take 1 Tab Every 8 Hours As Needed For Pain 2)  Clonidine Hcl 0.1 Mg Tabs (Clonidine Hcl) .... Take 1 Tab At Bedtime 3)  Chlorpheniramine Maleate 12 Mg Cr-Tabs (Chlorpheniramine Maleate) .... Take One By Mouth Two Times A Day 4)  Mag-Ox 400 400 Mg Tabs (Magnesium Oxide) .... At Bedtime 5)  Tessalon Perles 100 Mg  Caps (Benzonatate) .... One To Two By Mouth 3-4 Times Daily Generic Please 6)  Nasonex 50 Mcg/act  Susp (Mometasone Furoate) .... Two Puffs Each Nostril Daily 7)  Pravastatin Sodium 40 Mg Tabs (Pravastatin Sodium) .Marland Kitchen.. 1 Tab By Mouth Qhs 8)  Carvedilol 12.5 Mg Tabs (Carvedilol) .Marland Kitchen.. 1 Tab By Mouth Bid  Allergies (verified): 1)  ! Pcn 2)  ! * Ivp Dye 3)  ! Codeine 4)  ! Priscoline 5)  ! * Latex  Past History:  Past Medical History: Reviewed history from 02/11/2009 and no changes required. menopausal syndrome chronic neck pain -- Dr Orvan Falconer pain doctor HTN asthma high chol   last mammo 3-09  Past Surgical History: Reviewed history from 07/23/2009 and no changes required. BTL R knee  arthroscopy 1983 TAH w/o BSO 1986 for endometriosis R knee arthroscopy 1986, 1990 L foot heel spur 1996 appy 1983 lap chole 1995  Social History: Reviewed history from 05/30/2009 and no changes required. 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 Married to [place] (2nd marriage). Has a grown daughter in [place] and a teenage disabled granddaughter with spina bifida. Never smoked.  Rare ETOH. No exercise. Fair diet.  Review of Systems      See HPI  Physical Exam  General:  alert, well-developed, well-nourished, well-hydrated, and overweight-appearing.   Head:  normocephalic and atraumatic.   Mouth:  pharynx pink and moist.   Neck:  no masses.   Lungs:  Normal respiratory effort, chest expands symmetrically. Lungs are clear to auscultation, no crackles or wheezes. Heart:  Normal rate and regular rhythm. S1 and S2 normal without gallop, murmur, click, rub or other extra sounds. Skin:  color normal.     Impression & Recommendations:  Problem # 1:  ESSENTIAL HYPERTENSION, BENIGN (ICD-401.1) Still high, off Diovan and Chlorthalidone due to elevated BUN/ CR which did improve some (CR 1.5) on repeat.   Will restart only Diovan. Has labs done thru research study next month.  Keep an eye on renal  function with restart Diovan. Her updated medication list for this problem includes:    Clonidine Hcl 0.1 Mg Tabs (Clonidine hcl) .Marland Kitchen... Take 1 tab at bedtime    Carvedilol 12.5 Mg Tabs (Carvedilol) .Marland Kitchen... 1 tab by mouth bid    Diovan 80 Mg Tabs (Valsartan) .Marland Kitchen... 1 tab by mouth daily  BP today: 153/77 Prior BP: 103/60 (07/23/2009)  Labs Reviewed: K+: 4.2 (10/17/2009) Creat: : 1.50 (11/07/2009)   Chol: 241 (10/17/2009)   HDL: 54 (10/17/2009)   LDL: 162 (10/17/2009)   TG: 126 (10/17/2009)  Problem # 2:  HYPERLIPIDEMIA (ICD-272.4)  On Pravastatin.  Rechecking LFTs today. Her updated medication list for this problem includes:    Pravastatin Sodium 40 Mg Tabs  (Pravastatin sodium) .Marland Kitchen... 1 tab by mouth qhs  Labs Reviewed: SGOT: 41 (10/17/2009)   SGPT: 54 (10/17/2009)   HDL:54 (10/17/2009)  LDL:162 (10/17/2009)  Chol:241 (10/17/2009)  Trig:126 (10/17/2009)  Problem # 3:  OTHER NONSPECIFIC ABNORMAL SERUM ENZYME LEVELS (ICD-790.5) Repeat labs with Hep B and C testing today. If still high, get RUQ u/s -- suspect fatty liver dz. Orders: T-AST/SGOT ([id number]) T-ALT/SGPT (09811-91478) T-Hepatitis C Antibody ([id number]) T-Hepatitis B Core Antibody ([id number])  Complete Medication List: 1)  Vicoprofen 7.5-200 Mg Tabs (Hydrocodone-ibuprofen) .... Take 1 tab every 8 hours as needed for pain 2)  Clonidine Hcl 0.1 Mg Tabs (Clonidine hcl) .... Take 1 tab at bedtime 3)  Chlorpheniramine Maleate 12 Mg Cr-tabs (Chlorpheniramine maleate) .... Take one by mouth two times a day 4)  Mag-ox 400 400 Mg Tabs (Magnesium oxide) .... At bedtime 5)  Nasonex 50 Mcg/act Susp (Mometasone furoate) .... Two puffs each nostril daily 6)  Pravastatin Sodium 40 Mg Tabs (Pravastatin sodium) .Marland Kitchen.. 1 tab by mouth qhs 7)  Carvedilol 12.5 Mg Tabs (Carvedilol) .Marland Kitchen.. 1 tab by mouth bid 8)  Diovan 80 Mg Tabs (Valsartan) .Marland Kitchen.. 1 tab by mouth daily  Patient Instructions: 1)  Labs today. 2)  Will call you w/ results Tuesday. 3)  If LFTs still high, will scheule RUQ ultrasound to look at liver. 4)  Will restart Diovan 80 mg/ day for high BP. 5)  Repeat kidney function in 4-6 wks.  Fax copy from research study if drawn there. 6)  Keep up the good work with healthy diet, exercise, wt loss.

## 2010-04-29 NOTE — Progress Notes (Signed)
Summary: Clinical trial- BP clarification  Phone Note From Other Clinic   Caller: Dr. Sanjuana Mae Summary of Call: Pt is participating in a  clinical trial for HTN and one of the doctors from the trial would like to touch base w/ you and get clarification. Please return her call at [phone] or page her at [phone]. Initial call taken by: Payton Spark CMA,  November 27, 2009 12:02 PM  Follow-up for Phone Call        I tried to call back but was put on hold for a long period. Follow-up by: Seymour Bars DO,  November 27, 2009 12:42 PM

## 2010-04-29 NOTE — Progress Notes (Signed)
Summary: Cough  Phone Note Call from Patient   Caller: Patient Summary of Call: Pt Tracy Massey stating her cough is no better. Pt states inhalers are not working as well as they were. Pt would like to know if she needs to be seen or if you can call in cheap Rx for her. Please advise.  Initial call taken by: Payton Spark CMA,  May 27, 2009 11:41 AM  Follow-up for Phone Call        she needs to be seen by pulmonology since clinically she is failing to improve. Follow-up by: Seymour Bars DO,  May 27, 2009 12:02 PM     Appended Document: Cough Pt aware

## 2010-04-29 NOTE — Progress Notes (Signed)
Summary: Not any better  Phone Note Call from Patient   Caller: Patient Summary of Call: Pt LMOM stating she thinks she does not have asthma. She thinks she has bronchitis or pneumonia. pt states inhalers are making her worse. Please advise.  Initial call taken by: Payton Spark CMA,  April 16, 2009 12:34 PM  Follow-up for Phone Call        will get a CXR to confirm today. Follow-up by: Seymour Bars DO,  April 16, 2009 12:40 PM  New Problems: COUGH (ICD-786.2)   New Problems: COUGH (ICD-786.2)  Appended Document: Not any better Pt aware

## 2010-04-29 NOTE — Letter (Signed)
Summary: Letter Regarding Clinical Trial/SPRINT  Letter Regarding Clinical Trial/SPRINT   Imported By: Lanelle Bal 05/06/2009 11:19:33  _____________________________________________________________________  External Attachment:    Type:   Image     Comment:   External Document

## 2010-04-29 NOTE — Progress Notes (Signed)
Summary: ? UTI  Phone Note Call from Patient   Caller: Patient Summary of Call: Pt states she thinks she may have a UTI and is going to complete her lab work tomorrow morning. Pt would like to know if you will add a UA to lab orders. Initial call taken by: Payton Spark CMA,  October 16, 2009 3:55 PM  New Problems: DYSURIA (ICD-788.1)   New Problems: DYSURIA (ICD-788.1)  Appended Document: ? UTI order faxed

## 2010-05-01 NOTE — Letter (Signed)
Summary: Depression Questionnaire  Depression Questionnaire   Imported By: Lanelle Bal 03/21/2010 08:53:55  _____________________________________________________________________  External Attachment:    Type:   Image     Comment:   External Document

## 2010-05-01 NOTE — Progress Notes (Signed)
Summary: Added lisinopril  Phone Note Call from Patient   Caller: Patient Summary of Call: Pt went to BP study today and BP was still elevated. Doctor at study added lisinopril 20mg  1 by mouth once daily to current meds. Doctor from study will be faxing all notes today.  Initial call taken by: Payton Spark CMA,  April 15, 2010 1:53 PM    New/Updated Medications: LISINOPRIL 20 MG TABS (LISINOPRIL) Take 1 tab by mouth once daily

## 2010-05-01 NOTE — Assessment & Plan Note (Signed)
Summary: f/u mood   Vital Signs:  Patient profile:   60 year old female Menstrual status:  hysterectomy Height:      64 inches Weight:      171 pounds BMI:     29.46 O2 Sat:      97 % on Room air Pulse rate:   48 / minute BP sitting:   133 / 66  (left arm) Cuff size:   large  Vitals Entered By: Payton Spark CMA (March 11, 2010 10:53 AM)  O2 Flow:  Room air CC: F/U mood. Doing well but still c/o sleep issues   Primary Care Provider:  Seymour Bars DO  CC:  F/U mood. Doing well but still c/o sleep issues.  History of Present Illness: 60 yo WF presents for f/u depression.  Doing well on Fluoxetine, started 6 wks ago and is doing great.  She has noticed that she feels better about herself and likes to be around people. more.  Not arguing with husband as much.  She is having problems falling asleep and staying asleep most nights.  She used to take [facility] which helps.  She works 3rd shift at work.    She has always had sleep issues.     Allergies: 1)  ! Pcn 2)  ! * Ivp Dye 3)  ! Codeine 4)  ! Priscoline 5)  ! * Latex  Past History:  Past Medical History: Reviewed history from 02/11/2009 and no changes required. menopausal syndrome chronic neck pain -- Dr Orvan Falconer pain doctor HTN asthma high chol   last mammo 3-09  Past Surgical History: Reviewed history from 07/23/2009 and no changes required. BTL R knee arthroscopy 1983 TAH w/o BSO 1986 for endometriosis R knee arthroscopy 1986, 1990 L foot heel spur 1996 appy 1983 lap chole 1995  Social History: Reviewed history from 05/30/2009 and no changes required. 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 Married to [place] (2nd marriage). Has a grown daughter in [place] and a teenage disabled granddaughter with spina bifida. Never smoked.  Rare ETOH. No exercise. Fair diet.  Review of Systems      See HPI  Physical Exam  General:  alert, well-developed, well-nourished, and  well-hydrated.   Head:  normocephalic and atraumatic.   Psych:  good eye contact, not anxious appearing, and not depressed appearing.     Impression & Recommendations:  Problem # 1:  DEPRESSION (ICD-311) Assessment Improved PHQ 9 score : 4.  Much improved and pt is happy w/ her response.  Will continue her Fluoxetine at current dose, RFs done and f/u in 4 mos.  Call if any problems.   add Zolpidem at night as needed sleep, use caution and allow 8 hr before operating machinery after taking. Her updated medication list for this problem includes:    Fluoxetine Hcl 20 Mg Tabs (Fluoxetine hcl) .Marland Kitchen... 1 tab by mouth qam  Complete Medication List: 1)  Vicoprofen 7.5-200 Mg Tabs (Hydrocodone-ibuprofen) .... Take 1 tab every 8 hours as needed for pain 2)  Clonidine Hcl 0.1 Mg Tabs (Clonidine hcl) .... Take 1 tab at bedtime 3)  Mag-ox 400 400 Mg Tabs (Magnesium oxide) .... At bedtime 4)  Nasonex 50 Mcg/act Susp (Mometasone furoate) .... Two puffs each nostril daily 5)  Pravastatin Sodium 40 Mg Tabs (Pravastatin sodium) .Marland Kitchen.. 1 tab by mouth qhs 6)  Carvedilol 12.5 Mg Tabs (Carvedilol) .Marland Kitchen.. 1 tab by mouth bid 7)  Diovan 80 Mg Tabs (Valsartan) .Marland Kitchen.. 1 tab  by mouth daily 8)  Fluoxetine Hcl 20 Mg Tabs (Fluoxetine hcl) .Marland Kitchen.. 1 tab by mouth qam 9)  Zolpidem Tartrate 10 Mg Tabs (Zolpidem tartrate) .Marland Kitchen.. 1 tab by mouth at bedtime as needed sleep  Patient Instructions: 1)  Stay on current dose of Fluoxetine. 2)  Add Zolpidem at bedtime as needed for sleep.  Allow 8 hrs to sleep after taking. 3)  Call if any problems. 4)  Return for f/u in 3-4 mos. Prescriptions: ZOLPIDEM TARTRATE 10 MG TABS (ZOLPIDEM TARTRATE) 1 tab by mouth at bedtime as needed sleep  #30 x 1   Entered and Authorized by:   Seymour Bars DO   Signed by:   Seymour Bars DO on 03/11/2010   Method used:   Printed then faxed to ...       [facility] (retail)       [address]       [place], [place]  [address]       Ph: [phone]       Fax:  [phone]   RxID:   [id number] FLUOXETINE HCL 20 MG TABS (FLUOXETINE HCL) 1 tab by mouth qAM  #30 x 3   Entered and Authorized by:   Seymour Bars DO   Signed by:   Seymour Bars DO on 03/11/2010   Method used:   Electronically to        [facility] (retail)       [address]       [place], [place]  [address]       Ph: [phone]       Fax: [phone]   RxID:   [id number]    Orders Added: 1)  Est. Patient Level III [[address]32[address]]

## 2010-05-21 NOTE — Letter (Signed)
Summary: Health Screening/Sprint  Health Screening/Sprint   Imported By: Lanelle Bal 05/13/2010 12:24:34  _____________________________________________________________________  External Attachment:    Type:   Image     Comment:   External Document

## 2010-06-30 ENCOUNTER — Other Ambulatory Visit: Payer: Self-pay | Admitting: Family Medicine

## 2010-06-30 DIAGNOSIS — G8929 Other chronic pain: Secondary | ICD-10-CM

## 2010-06-30 NOTE — Telephone Encounter (Signed)
Rx faxed

## 2010-07-09 ENCOUNTER — Other Ambulatory Visit: Payer: Self-pay | Admitting: Family Medicine

## 2010-07-29 ENCOUNTER — Telehealth: Payer: Self-pay | Admitting: *Deleted

## 2010-07-29 DIAGNOSIS — G8929 Other chronic pain: Secondary | ICD-10-CM

## 2010-07-29 MED ORDER — HYDROCODONE-IBUPROFEN 7.5-200 MG PO TABS: 1.0000 | ORAL_TABLET | Freq: Three times a day (TID) | ORAL | Status: DC | PRN

## 2010-07-29 NOTE — Telephone Encounter (Signed)
RX printed 

## 2010-07-29 NOTE — Telephone Encounter (Signed)
Pt calls requesting refill on vicoprofen.  She states that the pharmacy has requested refill.  Last filled on 06/30/10.  Given #90.  Is this OK to refill.  Please advise.

## 2010-08-11 ENCOUNTER — Ambulatory Visit (INDEPENDENT_AMBULATORY_CARE_PROVIDER_SITE_OTHER): Payer: BC Managed Care – PPO | Admitting: Family Medicine

## 2010-08-11 ENCOUNTER — Encounter: Payer: Self-pay | Admitting: Family Medicine

## 2010-08-11 VITALS — BP 156/85 | HR 53 | Temp 98.5°F | Ht 63.0 in | Wt 175.0 lb

## 2010-08-11 DIAGNOSIS — L0291 Cutaneous abscess, unspecified: Secondary | ICD-10-CM

## 2010-08-11 MED ORDER — SULFAMETHOXAZOLE-TRIMETHOPRIM 800-160 MG PO TABS: 1.0000 | ORAL_TABLET | Freq: Two times a day (BID) | ORAL | Status: AC

## 2010-08-11 NOTE — Patient Instructions (Signed)
Clean belly button with antibacterial soap and water (dial) morning and night. Keep covered with polysporin ointment and a gauze dressing.  Stat Septra DS tonight.  Take with breakfast and dinner for 7 days. Will call you with culture result this wk.  Call if temp > 101, increase redness, or increased pain.  If not completely resolved in 7 days, return for f/u.

## 2010-08-11 NOTE — Progress Notes (Signed)
  Subjective:    Patient ID: Tracy Massey, female    DOB: 03-03-1951, 60 y.o.   MRN: [id number]  HPI 60 yo WF presents for irritation around her belly button that began a wk ago after wearing pants with a snap.  No hx of contact allergy to nickel.  She has tenderness, redness and drained pus and blood yesterday and today.  Used polysporin and alcohol and peroxide yesterday and today.  She had subjetive fevers last wk.  No chills. Denies GI upset or other symptoms.  She has been exposed to MRSA.  Denies pruritis.  No recent abdominal surgeries.  BP 156/85  Pulse 53  Temp(Src) 98.5 F (36.9 C) (Oral)  Ht 5\' 3"  (1.6 m)  Wt 175 lb (79.379 kg)  BMI 31.00 kg/m2  SpO2 96%     Review of Systems  Constitutional: Positive for fever. Negative for chills and fatigue.  Gastrointestinal: Positive for nausea. Negative for abdominal distention.  Hematological: Negative for adenopathy.  Psychiatric/Behavioral: Negative for decreased concentration. The patient is not nervous/anxious.        Objective:   Physical Exam  Constitutional: She appears well-developed and well-nourished.  Cardiovascular: Normal rate, regular rhythm and normal heart sounds.   Pulmonary/Chest: Effort normal and breath sounds normal.  Abdominal: Soft. There is tenderness (tender at umbilicus ). There is no guarding.  Lymphadenopathy:    She has no cervical adenopathy.  Skin: Skin is warm and dry.       Mild erythema on posterior edge of umbilicus with slight bloody discharge  Psychiatric: She has a normal mood and affect.          Assessment & Plan:  Infection inside umbilicus, likely initiated from break in skin with irritation from snap on pants last wk.  Visits the hospital daily for work and has been exposed to MRSA so will cover with Septra DS bid.  Clean 2 x a day and keep covered.  Call if any fever, spreading redness or increased pain.  Wound cx obtained today.

## 2010-08-12 ENCOUNTER — Telehealth: Payer: Self-pay | Admitting: Family Medicine

## 2010-08-12 ENCOUNTER — Other Ambulatory Visit: Payer: Self-pay | Admitting: Family Medicine

## 2010-08-12 NOTE — Telephone Encounter (Signed)
Pt aware of the above  

## 2010-08-12 NOTE — Telephone Encounter (Signed)
Pls let pt know that no organisms were present on her belly button culture.  Continue antibiotics and let me know if you have any problems.

## 2010-08-14 LAB — WOUND CULTURE: Gram Stain: NONE SEEN

## 2010-08-27 ENCOUNTER — Other Ambulatory Visit: Payer: Self-pay | Admitting: Family Medicine

## 2010-08-28 ENCOUNTER — Telehealth: Payer: Self-pay | Admitting: Family Medicine

## 2010-08-28 ENCOUNTER — Other Ambulatory Visit: Payer: Self-pay | Admitting: Family Medicine

## 2010-08-28 DIAGNOSIS — G8929 Other chronic pain: Secondary | ICD-10-CM

## 2010-08-28 DIAGNOSIS — R52 Pain, unspecified: Secondary | ICD-10-CM

## 2010-08-28 MED ORDER — HYDROCODONE-IBUPROFEN 7.5-200 MG PO TABS: 1.0000 | ORAL_TABLET | Freq: Three times a day (TID) | ORAL | Status: DC | PRN

## 2010-08-28 NOTE — Telephone Encounter (Signed)
Yes ok to send today.

## 2010-08-28 NOTE — Telephone Encounter (Addendum)
Pt called and said her pharm had faxed for a request of her vicoprofen 7.5/200mg  and we have not responded.  Past last refill was 07-29-10.  Due as of tomorrow.  Can we send today?? Plan:  Routed to Dr. Linford Arnold since Dr. Cathey Endow out of the office. Jarvis Newcomer, LPN [facility]   [id number]  Vicoprofen 7.5/200mg  #90/0 refills sent to [facility]. Pt informed. Jarvis Newcomer, LPN [facility]

## 2010-08-29 NOTE — Telephone Encounter (Signed)
Closed encounter. Letitia Sabala, LPN /Triage  

## 2010-09-10 ENCOUNTER — Encounter: Payer: Self-pay | Admitting: Family Medicine

## 2010-09-11 ENCOUNTER — Ambulatory Visit: Payer: BC Managed Care – PPO | Admitting: Family Medicine

## 2010-09-11 ENCOUNTER — Encounter: Payer: Self-pay | Admitting: Family Medicine

## 2010-09-11 DIAGNOSIS — I10 Essential (primary) hypertension: Secondary | ICD-10-CM

## 2010-09-11 NOTE — Assessment & Plan Note (Signed)
BP looks great today.  We reviewed her list since changes have been made by the research study she is in.  She recnelty had labs done there and will try to get a copy.  Stay OFF chlorthalidone.  Stay well hydrated given sporadic lows.

## 2010-09-11 NOTE — Progress Notes (Signed)
  Subjective:    Patient ID: Tracy Massey, female    DOB: Aug 15, 1950, 60 y.o.   MRN: [id number]  HPI  60 yo WF presents for f/u HTN.  She is doing well on her meds but is still going to this BP study in [place].  She has done well w/ trade from Diovan to Lisinopril.  She has been monitoring her home BPs and only had 2 lows but otherwise looks well.  The study added chlorthalidone but she stopped it after 3 days due to kidney effects in the past.  She had labs done her last visit there.    BP 118/68  Pulse 48  Ht 5\' 3"  (1.6 m)  Wt 180 lb (81.647 kg)  BMI 31.89 kg/m2  SpO2 98%   Review of Systems  Constitutional: Negative for fatigue.  Eyes: Negative for visual disturbance.  Respiratory: Negative for shortness of breath.   Cardiovascular: Negative for chest pain, palpitations and leg swelling.       Objective:   Physical Exam  Constitutional: She appears well-developed and well-nourished. No distress.       obese  Eyes: Pupils are equal, round, and reactive to light.  Cardiovascular: Normal rate, regular rhythm and normal heart sounds.   No murmur heard. Pulmonary/Chest: Effort normal and breath sounds normal. No respiratory distress.  Musculoskeletal: She exhibits no edema.  Lymphadenopathy:    She has no cervical adenopathy.  Skin: Skin is warm and dry.          Assessment & Plan:

## 2010-09-11 NOTE — Patient Instructions (Signed)
BP looks perfect.  Return for f/u in 4 mos.

## 2010-09-12 ENCOUNTER — Other Ambulatory Visit: Payer: Self-pay | Admitting: Family Medicine

## 2010-09-15 ENCOUNTER — Other Ambulatory Visit: Payer: Self-pay | Admitting: *Deleted

## 2010-09-15 MED ORDER — FLUOXETINE HCL 20 MG PO CAPS: 20.0000 mg | ORAL_CAPSULE | Freq: Every day | ORAL | Status: DC

## 2010-10-09 ENCOUNTER — Other Ambulatory Visit: Payer: Self-pay | Admitting: *Deleted

## 2010-10-09 MED ORDER — PRAVASTATIN SODIUM 40 MG PO TABS: 40.0000 mg | ORAL_TABLET | Freq: Every day | ORAL | Status: DC

## 2010-10-20 ENCOUNTER — Other Ambulatory Visit: Payer: Self-pay | Admitting: Family Medicine

## 2010-10-20 DIAGNOSIS — Z1231 Encounter for screening mammogram for malignant neoplasm of breast: Secondary | ICD-10-CM

## 2010-10-23 ENCOUNTER — Encounter: Payer: Self-pay | Admitting: Family Medicine

## 2010-10-23 ENCOUNTER — Ambulatory Visit (INDEPENDENT_AMBULATORY_CARE_PROVIDER_SITE_OTHER): Payer: BC Managed Care – PPO | Admitting: Family Medicine

## 2010-10-23 VITALS — BP 148/72 | HR 59 | Temp 99.0°F | Ht 63.0 in | Wt 177.0 lb

## 2010-10-23 DIAGNOSIS — L0291 Cutaneous abscess, unspecified: Secondary | ICD-10-CM

## 2010-10-23 DIAGNOSIS — R220 Localized swelling, mass and lump, head: Secondary | ICD-10-CM

## 2010-10-23 DIAGNOSIS — K130 Diseases of lips: Secondary | ICD-10-CM

## 2010-10-23 MED ORDER — METHYLPREDNISOLONE ACETATE 80 MG/ML IJ SUSP
80.0000 mg | Freq: Once | INTRAMUSCULAR | Status: AC
  Administered 2010-10-23: 60 mg via INTRAMUSCULAR

## 2010-10-23 MED ORDER — CEPHALEXIN 500 MG PO CAPS: 500.0000 mg | ORAL_CAPSULE | Freq: Three times a day (TID) | ORAL | Status: DC

## 2010-10-23 NOTE — Patient Instructions (Signed)
Steroid injection given today for lip swelling/ neomycin reaction.  Start on Keflex 3 x a day for infection for 7 days.  Clean with antibacterial soap and water 2-3 x a day.   Alternate heat/ cold.  Call if not improved by Monday.

## 2010-10-23 NOTE — Progress Notes (Signed)
  Subjective:    Patient ID: Tracy Massey, female    DOB: 1950-12-20, 60 y.o.   MRN: [id number]  HPI  60 yo WF presents for a swollen painful bump on the L side of her upper lip.  She used neosporin Monday night and on Tuesday morning, her lip was swollen and with tingling.  No bleeding or drainage.  Her Temp has gone up to 100.1.  Denies any other symptoms.  BP 148/72  Pulse 59  Temp(Src) 99 F (37.2 C) (Oral)  Ht 5\' 3"  (1.6 m)  Wt 177 lb (80.287 kg)  BMI 31.35 kg/m2  SpO2 97%    Review of Systems  Constitutional: Positive for fever.  HENT: Negative for sore throat, mouth sores and trouble swallowing.   Gastrointestinal: Negative for nausea.  Neurological: Negative for dizziness.       Objective:   Physical Exam  Constitutional: She appears well-developed and well-nourished.  Neck: Neck supple.  Cardiovascular: Normal rate, regular rhythm and normal heart sounds.   Pulmonary/Chest: Effort normal and breath sounds normal.  Lymphadenopathy:    She has no cervical adenopathy.  Skin: Skin is warm and dry.       Erythematous lesion that is hard with L upper lip soft tissue edema, tender, not draining.  Lesion cleaned with alcohol swab and using a TB syringe, opened for cutlure. No pus came out, only sm amt of blood.          Assessment & Plan:

## 2010-10-24 ENCOUNTER — Telehealth: Payer: Self-pay | Admitting: Family Medicine

## 2010-10-24 NOTE — Telephone Encounter (Signed)
Pt aware.

## 2010-10-24 NOTE — Telephone Encounter (Signed)
Pls let pt know that her wound culture is negative.  Pls find out if her lip swelling is improving.

## 2010-10-25 DIAGNOSIS — R220 Localized swelling, mass and lump, head: Secondary | ICD-10-CM | POA: Insufficient documentation

## 2010-10-25 NOTE — Assessment & Plan Note (Addendum)
Localized L sided upper lip swelling right after application of neosporin with initial pustular lesion.  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  Will treat with DepoMedrol injection today.  Stop Neomycin.  Use ice packs and f/u wound culture.  Call if not improving in the next 72 hrs or if any new symptoms develop. Cover for infection given fever with 7 days of Keflex.

## 2010-10-26 ENCOUNTER — Telehealth: Payer: Self-pay | Admitting: Family Medicine

## 2010-10-26 LAB — WOUND CULTURE

## 2010-10-26 NOTE — Telephone Encounter (Signed)
Pls let pt know that her wound cutlure did finally grow out MRSA.  It is RESISTANT to Keflex, so if her redness and swelling has not resolved, I will change her antibiotics on MON.

## 2010-10-27 ENCOUNTER — Other Ambulatory Visit: Payer: Self-pay | Admitting: *Deleted

## 2010-10-27 MED ORDER — SULFAMETHOXAZOLE-TRIMETHOPRIM 800-160 MG PO TABS: 1.0000 | ORAL_TABLET | Freq: Two times a day (BID) | ORAL | Status: AC

## 2010-10-27 NOTE — Telephone Encounter (Signed)
Stop keflex.  Will change to bactrim DS which her MRSA is sensitive to.  This should work.  If not seeing reduction in redness or continues to run a fever, needs OV within the next 2-3 days.

## 2010-10-27 NOTE — Telephone Encounter (Signed)
Pt aware and wound is not getting better. Please send ABX to pharm.

## 2010-10-27 NOTE — Telephone Encounter (Signed)
Pt aware.

## 2010-10-28 ENCOUNTER — Ambulatory Visit
Admission: RE | Admit: 2010-10-28 | Discharge: 2010-10-28 | Disposition: A | Discharge status: Home / Self Care | Payer: BC Managed Care – PPO | Source: Ambulatory Visit | Attending: Family Medicine | Admitting: Family Medicine

## 2010-10-28 DIAGNOSIS — Z1231 Encounter for screening mammogram for malignant neoplasm of breast: Secondary | ICD-10-CM

## 2010-10-30 ENCOUNTER — Telehealth: Payer: Self-pay | Admitting: Family Medicine

## 2010-10-30 ENCOUNTER — Other Ambulatory Visit: Payer: Self-pay | Admitting: Family Medicine

## 2010-10-30 DIAGNOSIS — G8929 Other chronic pain: Secondary | ICD-10-CM

## 2010-10-30 DIAGNOSIS — R52 Pain, unspecified: Secondary | ICD-10-CM

## 2010-10-30 MED ORDER — HYDROCODONE-IBUPROFEN 7.5-200 MG PO TABS: 1.0000 | ORAL_TABLET | Freq: Three times a day (TID) | ORAL | Status: DC | PRN

## 2010-10-30 NOTE — Telephone Encounter (Signed)
Pt called in for refill of her vicoprofen medication.  Rx printed off for Dr. Linford Arnold to sign since Dr. Cathey Endow out of the office. Will fax to pt pharm once Rx signed. Jarvis Newcomer, LPN [facility]

## 2010-10-30 NOTE — Telephone Encounter (Signed)
Closed

## 2010-11-03 ENCOUNTER — Other Ambulatory Visit: Payer: Self-pay | Admitting: *Deleted

## 2010-11-27 ENCOUNTER — Encounter: Payer: Self-pay | Admitting: Family Medicine

## 2010-12-02 ENCOUNTER — Encounter: Payer: Self-pay | Admitting: Family Medicine

## 2010-12-02 ENCOUNTER — Ambulatory Visit (INDEPENDENT_AMBULATORY_CARE_PROVIDER_SITE_OTHER): Payer: BC Managed Care – PPO | Admitting: Family Medicine

## 2010-12-02 DIAGNOSIS — F17200 Nicotine dependence, unspecified, uncomplicated: Secondary | ICD-10-CM

## 2010-12-02 DIAGNOSIS — F32A Depression, unspecified: Secondary | ICD-10-CM

## 2010-12-02 DIAGNOSIS — R0989 Other specified symptoms and signs involving the circulatory and respiratory systems: Secondary | ICD-10-CM | POA: Insufficient documentation

## 2010-12-02 DIAGNOSIS — I10 Essential (primary) hypertension: Secondary | ICD-10-CM

## 2010-12-02 DIAGNOSIS — F329 Major depressive disorder, single episode, unspecified: Secondary | ICD-10-CM

## 2010-12-02 MED ORDER — METOPROLOL SUCCINATE ER 50 MG PO TB24: 50.0000 mg | ORAL_TABLET | Freq: Every day | ORAL | Status: DC

## 2010-12-02 NOTE — Progress Notes (Deleted)
  Subjective:    Patient ID: Tracy Massey, female    DOB: [birth year], 60 y.o.   MRN: [id number]  Cough This is a chronic problem. The problem has been unchanged. The cough is non-productive. Associated symptoms include shortness of breath. Associated symptoms comments: May need to increase O2. Risk factors for lung disease include smoking/tobacco exposure. The treatment provided moderate relief. Her past medical history is significant for COPD.      Review of Systems  Constitutional: Negative.   Respiratory: Positive for cough and shortness of breath.        Objective:   Physical Exam  Constitutional: She is oriented to person, place, and time. She appears well-developed and well-nourished.  HENT:  Head: Normocephalic.  Neck: Normal range of motion.  Cardiovascular: Normal rate and regular rhythm.   Musculoskeletal: Normal range of motion.  Neurological: She is oriented to person, place, and time.  Skin: Skin is warm.          Assessment & Plan:  Will plan for health maintenance when she returns in October. Spent extencive time discussing smoking cessation

## 2010-12-03 ENCOUNTER — Other Ambulatory Visit: Payer: Self-pay | Admitting: Family Medicine

## 2010-12-04 ENCOUNTER — Other Ambulatory Visit: Payer: Self-pay | Admitting: Family Medicine

## 2010-12-04 ENCOUNTER — Telehealth: Payer: Self-pay | Admitting: Family Medicine

## 2010-12-04 DIAGNOSIS — R52 Pain, unspecified: Secondary | ICD-10-CM

## 2010-12-04 DIAGNOSIS — G8929 Other chronic pain: Secondary | ICD-10-CM

## 2010-12-04 MED ORDER — ZOLPIDEM TARTRATE 10 MG PO TABS: 10.0000 mg | ORAL_TABLET | Freq: Every evening | ORAL | Status: DC | PRN

## 2010-12-04 MED ORDER — HYDROCODONE-IBUPROFEN 7.5-200 MG PO TABS: 1.0000 | ORAL_TABLET | Freq: Three times a day (TID) | ORAL | Status: DC | PRN

## 2010-12-04 NOTE — Telephone Encounter (Signed)
Pt called and said she had seen Dr. Thurmond Butts earlier in the week and was given one new script but he was suppose to have sent refills on her vicoprofen and zolpidem medications, but the pharm doesn't have. Plan:  Reviewed the chart and indeed no refills of these requested meds were sent.  Printed both meds and will have Dr. Linford Arnold sign off, and then will fax to the pharm. Jarvis Newcomer, LPN [facility]

## 2010-12-08 NOTE — Telephone Encounter (Signed)
Closed

## 2010-12-09 ENCOUNTER — Encounter: Payer: Self-pay | Admitting: Family Medicine

## 2010-12-09 NOTE — Progress Notes (Deleted)
  Subjective:    Patient ID: Tracy Massey, female    DOB: Jun 20, 1950, 60 y.o.   MRN: [id number]  HPI    Review of Systems     Objective:   Physical Exam        Assessment & Plan:   Subjective:     Tracy Massey is a 60 y.o. female who presents for follow up of depression. Current symptoms include depressed mood. Symptoms have been stable since that time. Patient denies hopelessness, suicidal attempt and suicidal thoughts with specific plan. Previous treatment includes: medication. She complains of the following side effects from the treatment: none.    Review of Systems     Objective:    BP 129/74  Pulse 50  Wt 179 lb (81.194 kg)  SpO2 96%  General:  no distress  Affect & Behavior:  good grooming, normal perception, normal reasoning and normal speech pattern and content       Assessment:    Depression, stable      Plan:     Continue current care

## 2010-12-23 NOTE — Progress Notes (Signed)
  Subjective:    Patient ID: Tracy Massey, female    DOB: 03-21-1951, 60 y.o.   MRN: [id number]  HPI Patient is here for BP review. Her BP has fluctuated greatly. Her circadian rhythm has been changed since she always goes to bed at about 6:00 PM and rises at 1:00to 2:00 even when she is off. We talked about the schedule of when she takes her medication and if that could afect her BP>   Review of Systems  Constitutional:       Increase sweating  Very sleepy attimes and hearing muffeled  Neurological: Positive for dizziness and weakness.       Objective:   Physical Exam  Constitutional: She appears well-developed and well-nourished.  HENT:  Head: Normocephalic.  Cardiovascular: Normal rate, regular rhythm, normal heart sounds and intact distal pulses.   Pulmonary/Chest: Breath sounds normal.  Musculoskeletal: Normal range of motion.  Skin: Skin is warm and dry.          Assessment & Plan:  Hypertension minor changes to when she takes her medication. Also may consider switching to a true time release preparation like toprol instead of coreg. Return in 6 weeks-8 weeks Patient declines flu vaccine Stop smoking.

## 2011-01-02 ENCOUNTER — Other Ambulatory Visit: Payer: Self-pay | Admitting: Family Medicine

## 2011-01-05 ENCOUNTER — Other Ambulatory Visit: Payer: Self-pay | Admitting: *Deleted

## 2011-01-05 MED ORDER — HYDROCODONE-IBUPROFEN 7.5-200 MG PO TABS: 1.0000 | ORAL_TABLET | Freq: Three times a day (TID) | ORAL | Status: DC | PRN

## 2011-01-05 MED ORDER — CLONIDINE HCL 0.1 MG PO TABS: 0.1000 mg | ORAL_TABLET | Freq: Every day | ORAL | Status: DC

## 2011-01-05 MED ORDER — PRAVASTATIN SODIUM 40 MG PO TABS: 40.0000 mg | ORAL_TABLET | Freq: Every day | ORAL | Status: DC

## 2011-01-05 MED ORDER — FLUOXETINE HCL 20 MG PO CAPS: 20.0000 mg | ORAL_CAPSULE | Freq: Every day | ORAL | Status: DC

## 2011-01-13 ENCOUNTER — Ambulatory Visit (INDEPENDENT_AMBULATORY_CARE_PROVIDER_SITE_OTHER): Payer: BC Managed Care – PPO | Admitting: Family Medicine

## 2011-01-13 ENCOUNTER — Ambulatory Visit: Payer: BC Managed Care – PPO | Admitting: Family Medicine

## 2011-01-13 ENCOUNTER — Encounter: Payer: Self-pay | Admitting: Family Medicine

## 2011-01-13 VITALS — BP 193/81 | HR 47 | Ht 63.0 in | Wt 181.0 lb

## 2011-01-13 DIAGNOSIS — R0989 Other specified symptoms and signs involving the circulatory and respiratory systems: Secondary | ICD-10-CM

## 2011-01-13 DIAGNOSIS — F329 Major depressive disorder, single episode, unspecified: Secondary | ICD-10-CM

## 2011-01-13 DIAGNOSIS — I10 Essential (primary) hypertension: Secondary | ICD-10-CM

## 2011-01-13 DIAGNOSIS — F32A Depression, unspecified: Secondary | ICD-10-CM

## 2011-01-13 MED ORDER — FLUOXETINE HCL 40 MG PO CAPS: 40.0000 mg | ORAL_CAPSULE | Freq: Every day | ORAL | Status: DC

## 2011-01-13 NOTE — Patient Instructions (Signed)
Please increase prozac from 1 a day to 2 a day until new scrip of 40 mg are filled and then 1 a day. Will continue w toprol XL Follow up for hypertension and fatigue in 4 months unless changes occur Next month return for trigger point injections

## 2011-01-13 NOTE — Progress Notes (Signed)
  Subjective:    Patient ID: Tracy Massey, female    DOB: [birth year], 60 y.o.   MRN: [id number]  HPI  Patient reports that her labile blood pressure has improved we stopped her Coreg twice a day and switched to Toprol XL She she did have a chart of her blood pressure should bring it today her blood pressure is elevated todaybut some days her blood pressures been down as low as 96/60  She states that she's had some chronic neck problems and end asked that if we did  trigger point injections. Review of Systems  Constitutional: Negative.   Musculoskeletal: Positive for myalgias and back pain.       Objective:   Physical Exam  Today her blood pressure is elevated blood pressure is 193/81 height 5 foot 3 weight 181 pounds.  Well-nourished well-developed white female no acute distress lungs were clear to to A&P cardiovascular S1-S2      Assessment & Plan:  #1 patient still refuses flu shot #2 hypertension has been better controlled still slow labille #3 menopausal symptoms were going to switch her Catapres 0.1 mg at night to the daytime to help control her hot flashes to return in 3-4 months for hypertension  #4 trigger points will need to inject the trigger point on her neck. Since the recipe that was most sessile was lidocaine Marcaine and Toradol 1 mL of free injected into the carpal trigger points and we'll try that to see if it helps. She states that the injection seemed to work for at least 6 months and gave her the most relief. Outpatient Encounter Prescriptions as of 01/13/2011  Medication Sig Dispense Refill  . cloNIDine (CATAPRES) 0.1 MG tablet Take 1 tablet (0.1 mg total) by mouth at bedtime.  30 tablet  3  . FLUoxetine (PROZAC) 40 MG capsule Take 1 capsule (40 mg total) by mouth daily.  30 capsule  6  . hydrALAZINE (APRESOLINE) 50 MG tablet Take 50 mg by mouth at bedtime.        Marland Kitchen HYDROcodone-ibuprofen (VICOPROFEN) 7.5-200 MG per tablet Take 1 tablet by mouth every 8  (eight) hours as needed for pain.  90 tablet  0  . lisinopril (PRINIVIL,ZESTRIL) 40 MG tablet Take 40 mg by mouth daily.        . magnesium oxide (MAG-OX) 400 MG tablet Take 400 mg by mouth at bedtime.        . metoprolol (TOPROL XL) 50 MG 24 hr tablet Take 1 tablet (50 mg total) by mouth daily. If BP becomes too low may take 1/2 tablet  30 tablet  6  . mometasone (NASONEX) 50 MCG/ACT nasal spray Place 2 sprays into the nose daily.        . pravastatin (PRAVACHOL) 40 MG tablet Take 1 tablet (40 mg total) by mouth daily.  30 tablet  2  . zolpidem (AMBIEN) 10 MG tablet Take 1 tablet (10 mg total) by mouth at bedtime as needed.  30 tablet  0  . DISCONTD: FLUoxetine (PROZAC) 20 MG capsule Take 1 capsule (20 mg total) by mouth daily.  30 capsule  3  . DISCONTD: FLUoxetine (PROZAC) 20 MG capsule Take 40 mg by mouth daily.

## 2011-02-09 ENCOUNTER — Other Ambulatory Visit: Payer: Self-pay | Admitting: Family Medicine

## 2011-02-17 ENCOUNTER — Ambulatory Visit: Payer: BC Managed Care – PPO | Admitting: Family Medicine

## 2011-02-24 ENCOUNTER — Encounter: Payer: Self-pay | Admitting: Family Medicine

## 2011-02-24 ENCOUNTER — Ambulatory Visit (INDEPENDENT_AMBULATORY_CARE_PROVIDER_SITE_OTHER): Payer: BC Managed Care – PPO | Admitting: Family Medicine

## 2011-02-24 VITALS — BP 160/100 | HR 46 | Ht 62.0 in | Wt 178.0 lb

## 2011-02-24 DIAGNOSIS — T148XXA Other injury of unspecified body region, initial encounter: Secondary | ICD-10-CM | POA: Insufficient documentation

## 2011-02-24 DIAGNOSIS — T1490XA Injury, unspecified, initial encounter: Secondary | ICD-10-CM

## 2011-02-24 DIAGNOSIS — M62838 Other muscle spasm: Secondary | ICD-10-CM

## 2011-02-24 DIAGNOSIS — M542 Cervicalgia: Secondary | ICD-10-CM

## 2011-02-24 DIAGNOSIS — Z23 Encounter for immunization: Secondary | ICD-10-CM

## 2011-02-24 DIAGNOSIS — G8929 Other chronic pain: Secondary | ICD-10-CM | POA: Insufficient documentation

## 2011-02-24 DIAGNOSIS — M7918 Myalgia, other site: Secondary | ICD-10-CM | POA: Insufficient documentation

## 2011-02-24 NOTE — Patient Instructions (Signed)
Myalgia, Adult Myalgia is the medical term for muscle pain. It is a symptom of many things. Nearly everyone at some time in their life has this. The most common cause for muscle pain is overuse or straining and more so when you are not in shape. Injuries and muscle bruises cause myalgias. Muscle pain without a history of injury can also be caused by a virus. It frequently comes along with the flu. Myalgia not caused by muscle strain can be present in a large number of infectious diseases. Some autoimmune diseases like lupus and fibromyalgia can cause muscle pain. Myalgia may be mild, or severe. SYMPTOMS  The symptoms of myalgia are simply muscle pain. Most of the time this is short lived and the pain goes away without treatment. DIAGNOSIS  Myalgia is diagnosed by your caregiver by taking your history. This means you tell him when the problems began, what they are, and what has been happening. If this has not been a long term problem, your caregiver may want to watch for a while to see what will happen. If it has been long term, they may want to do additional testing. TREATMENT  The treatment depends on what the underlying cause of the muscle pain is. Often anti-inflammatory medications will help. HOME CARE INSTRUCTIONS  If the pain in your muscles came from overuse, slow down your activities until the problems go away.   Myalgia from overuse of a muscle can be treated with alternating hot and cold packs on the muscle affected or with cold for the first couple days. If either heat or cold seems to make things worse, stop their use.   Apply ice to the sore area for 15 to 20 minutes, 3 to 4 times per day, while awake for the first 2 days of muscle soreness, or as directed. Put the ice in a plastic bag and place a towel between the bag of ice and your skin.   Only take over-the-counter or prescription medicines for pain, discomfort, or fever as directed by your caregiver.   Regular gentle exercise may  help if you are not active.   Stretching before strenuous exercise can help lower the risk of myalgia. It is normal when beginning an exercise regimen to feel some muscle pain after exercising. Muscles that have not been used frequently will be sore at first. If the pain is extreme, this may mean injury to a muscle.  SEEK MEDICAL CARE IF:  You have an increase in muscle pain that is not relieved with medication.   You begin to run a temperature.   You develop nausea and vomiting.   You develop a stiff and painful neck.   You develop a rash.   You develop muscle pain after a tick bite.   You have continued muscle pain while working out even after you are in good condition.  SEEK IMMEDIATE MEDICAL CARE IF: Any of your problems are getting worse and medications are not helping. MAKE SURE YOU:   Understand these instructions.   Will watch your condition.   Will get help right away if you are not doing well or get worse.  Document Released: 02/05/2006 Document Revised: 11/26/2010 Document Reviewed: 04/27/2006 [employer] Patient Information 2012 [employer]. 

## 2011-02-24 NOTE — Assessment & Plan Note (Signed)
Trigger Point Injection   Pre-operative diagnosis: myofascial pain  Post-operative diagnosis: myofascial pain  After risks and benefits were explained including bleeding, infection, worsening of the pain, damage to the area being injected, weakness, allergic reaction to medications, vascular injection, and nerve damage, signed consent was obtained.  All questions were answered.    The area of the trigger point was identified and the skin prepped three times with betadine and the betadine  allowed to dry.  Next, a 25 gauge 1 inch needle was placed in the area of the trigger point.  Once reproduction of the pain was elicited and negative aspiration confirmed, the trigger point was injected and the needle removed.    The patient did tolerate the procedure well and there were not complications.    Medication used: 9 mg depomedrol;1 ml of toradol 30mg  1 mL 1%lidocaine Trigger points injected: 8    Trigger point(s) location(s):  bilateral shoulder and base of neck  Patiet tolerated the injections and given a tentitive appoint ment  For repeat injection in 4 months.

## 2011-02-24 NOTE — Progress Notes (Signed)
  Subjective:    Patient ID: Tracy Massey, female    DOB: [birth year], 60 y.o.   MRN: [id number]  Hypertension    Patient's here for trigger point injections She also needs flu vaccination  Review of Systems Essentially unremarkable  BP 160/100  Pulse 46  Ht 5\' 2"  (1.575 m)  Wt 178 lb (80.74 kg)  BMI 32.56 kg/m2  SpO2 96%   Objective:   Physical Exam  Number of trigger points were found to the neck and shoulder #8 please see assessment plan for the procedure      Assessment & Plan:  #1 flu shot needed and will be given. are #2 patient patient has a return appointment to reevaluate her blood pressure we concerned because this was elevated today #3 trigger point injection

## 2011-03-10 ENCOUNTER — Other Ambulatory Visit: Payer: Self-pay | Admitting: Family Medicine

## 2011-04-08 ENCOUNTER — Other Ambulatory Visit: Payer: Self-pay | Admitting: *Deleted

## 2011-04-08 MED ORDER — HYDROCODONE-IBUPROFEN 7.5-200 MG PO TABS: 1.0000 | ORAL_TABLET | Freq: Three times a day (TID) | ORAL | Status: DC | PRN

## 2011-04-08 MED ORDER — PRAVASTATIN SODIUM 40 MG PO TABS: 40.0000 mg | ORAL_TABLET | Freq: Every day | ORAL | Status: DC

## 2011-04-23 ENCOUNTER — Ambulatory Visit: Payer: BC Managed Care – PPO | Admitting: Family Medicine

## 2011-04-28 ENCOUNTER — Encounter: Payer: Self-pay | Admitting: Family Medicine

## 2011-04-28 ENCOUNTER — Ambulatory Visit (INDEPENDENT_AMBULATORY_CARE_PROVIDER_SITE_OTHER): Payer: BC Managed Care – PPO | Admitting: Family Medicine

## 2011-04-28 VITALS — BP 118/59 | HR 53 | Ht 63.0 in | Wt 178.0 lb

## 2011-04-28 DIAGNOSIS — F329 Major depressive disorder, single episode, unspecified: Secondary | ICD-10-CM

## 2011-04-28 DIAGNOSIS — Z2839 Other underimmunization status: Secondary | ICD-10-CM

## 2011-04-28 DIAGNOSIS — Z283 Underimmunization status: Secondary | ICD-10-CM

## 2011-04-28 DIAGNOSIS — I10 Essential (primary) hypertension: Secondary | ICD-10-CM

## 2011-04-28 DIAGNOSIS — F3289 Other specified depressive episodes: Secondary | ICD-10-CM

## 2011-04-28 DIAGNOSIS — F32A Depression, unspecified: Secondary | ICD-10-CM

## 2011-04-28 MED ORDER — PNEUMOCOCCAL VAC POLYVALENT 25 MCG/0.5ML IJ INJ: 0.5000 mL | INJECTION | INTRAMUSCULAR | Status: AC

## 2011-04-28 MED ORDER — TETANUS-DIPHTH-ACELL PERTUSSIS 5-2.5-18.5 LF-MCG/0.5 IM SUSP: 0.5000 mL | Freq: Once | INTRAMUSCULAR | Status: DC

## 2011-04-28 MED ORDER — BUPROPION HCL ER (SR) 150 MG PO TB12: 150.0000 mg | ORAL_TABLET | Freq: Two times a day (BID) | ORAL | Status: DC

## 2011-04-28 MED ORDER — ESTROGENS CONJUGATED 0.3 MG PO TABS: 0.3000 mg | ORAL_TABLET | Freq: Every day | ORAL | Status: DC

## 2011-04-28 NOTE — Progress Notes (Signed)
  Subjective:    Patient ID: Tracy Massey, female    DOB: [birth year], 61 y.o.   MRN: [id number]  Hypertension This is a chronic problem. The current episode started more than 1 year ago. The problem is unchanged. The problem is controlled. There are no associated agents to hypertension. Risk factors for coronary artery disease include sedentary lifestyle, obesity and dyslipidemia. Past treatments include beta blockers. The current treatment provides significant improvement. There are no compliance problems.    Patient has had some hot flashes's. She reports a total hysterectomy with ovarian persevation.  Depression patient reports failling of her prozac. It seems about 6 weeks ago she went back into her depression.  GAD was 7 PHQ-9 was 0 for little pleasure, 2,6,&9  Immunization up date   Review of Systems  Psychiatric/Behavioral: Positive for dysphoric mood.      BP 118/59  Pulse 53  Ht 5\' 3"  (1.6 m)  Wt 178 lb (80.74 kg)  BMI 31.53 kg/m2  SpO2 96% Objective:   Physical Exam  Constitutional: She is oriented to person, place, and time. She appears well-developed and well-nourished.  HENT:  Head: Normocephalic.  Neck: Normal range of motion. Neck supple.  Cardiovascular: Normal rate, regular rhythm and normal heart sounds.   Pulmonary/Chest: Effort normal and breath sounds normal.  Neurological: She is alert and oriented to person, place, and time.  Skin: Skin is warm and dry.  Psychiatric: Her mood appears not anxious. Her affect is not angry and not inappropriate. She exhibits a depressed mood.          Assessment & Plan:   Depression since the SSRI did work initially add Wellbutrin and continue with the Prozac Menopasal symptoms not clear if this is contributing to her depression but will add low dose Pemarin.3mg  po a day have her return in [place] for PE Immunization update Dtap and Pneumonia vaccine will be given

## 2011-04-28 NOTE — Patient Instructions (Signed)

## 2011-05-18 ENCOUNTER — Ambulatory Visit (INDEPENDENT_AMBULATORY_CARE_PROVIDER_SITE_OTHER): Payer: BC Managed Care – PPO | Admitting: Physician Assistant

## 2011-05-18 ENCOUNTER — Encounter: Payer: Self-pay | Admitting: Physician Assistant

## 2011-05-18 VITALS — BP 94/62 | HR 59 | Temp 98.3°F | Ht 63.0 in | Wt 170.0 lb

## 2011-05-18 DIAGNOSIS — K5732 Diverticulitis of large intestine without perforation or abscess without bleeding: Secondary | ICD-10-CM

## 2011-05-18 DIAGNOSIS — K5792 Diverticulitis of intestine, part unspecified, without perforation or abscess without bleeding: Secondary | ICD-10-CM

## 2011-05-18 MED ORDER — CIPROFLOXACIN HCL 500 MG PO TABS: 500.0000 mg | ORAL_TABLET | Freq: Two times a day (BID) | ORAL | Status: AC

## 2011-05-18 MED ORDER — METRONIDAZOLE 500 MG PO TABS: 500.0000 mg | ORAL_TABLET | Freq: Three times a day (TID) | ORAL | Status: AC

## 2011-05-18 NOTE — Patient Instructions (Addendum)
Start Clear liquid diet for 2-3 days. Can move to [employer] as tolerated. Start Cipro and Metronidazole for 10 days. Follow up if pain increases or not improving.   Diverticulitis A diverticulum is a small pouch or sac on the colon. Diverticulosis is the presence of these diverticula on the colon. Diverticulitis is the irritation (inflammation) or infection of diverticula. CAUSES  The colon and its diverticula contain bacteria. If food particles block the tiny opening to a diverticulum, the bacteria inside can grow and cause an increase in pressure. This leads to infection and inflammation and is called diverticulitis. SYMPTOMS   Abdominal pain and tenderness. Usually, the pain is located on the left side of your abdomen. However, it could be located elsewhere.   Fever.   Bloating.   Feeling sick to your stomach (nausea).   Throwing up (vomiting).   Abnormal stools.  DIAGNOSIS  Your caregiver will take a history and perform a physical exam. Since many things can cause abdominal pain, other tests may be necessary. Tests may include:  Blood tests.   Urine tests.   X-ray of the abdomen.   CT scan of the abdomen.  Sometimes, surgery is needed to determine if diverticulitis or other conditions are causing your symptoms. TREATMENT  Most of the time, you can be treated without surgery. Treatment includes:  Resting the bowels by only having liquids for a few days. As you improve, you will need to eat a low-fiber diet.   Intravenous (IV) fluids if you are losing body fluids (dehydrated).   Antibiotic medicines that treat infections may be given.   Pain and nausea medicine, if needed.   Surgery if the inflamed diverticulum has burst.  HOME CARE INSTRUCTIONS   Try a clear liquid diet (broth, tea, or water for as long as directed by your caregiver). You may then gradually begin a low-fiber diet as tolerated. A low-fiber diet is a diet with less than 10 grams of fiber. Choose the foods  below to reduce fiber in the diet:   White breads, cereals, rice, and pasta.   Cooked fruits and vegetables or soft fresh fruits and vegetables without the skin.   Ground or well-cooked tender beef, ham, veal, lamb, pork, or poultry.   Eggs and seafood.   After your diverticulitis symptoms have improved, your caregiver may put you on a high-fiber diet. A high-fiber diet includes 14 grams of fiber for every 1000 calories consumed. For a standard 2000 calorie diet, you would need 28 grams of fiber. Follow these diet guidelines to help you increase the fiber in your diet. It is important to slowly increase the amount fiber in your diet to avoid gas, constipation, and bloating.   Choose whole-grain breads, cereals, pasta, and brown rice.   Choose fresh fruits and vegetables with the skin on. Do not overcook vegetables because the more vegetables are cooked, the more fiber is lost.   Choose more nuts, seeds, legumes, dried peas, beans, and lentils.   Look for food products that have greater than 3 grams of fiber per serving on the Nutrition Facts label.   Take all medicine as directed by your caregiver.   If your caregiver has given you a follow-up appointment, it is very important that you go. Not going could result in lasting (chronic) or permanent injury, pain, and disability. If there is any problem keeping the appointment, call to reschedule.  SEEK MEDICAL CARE IF:   Your pain does not improve.   You have  a hard time advancing your diet beyond clear liquids.   Your bowel movements do not return to normal.  SEEK IMMEDIATE MEDICAL CARE IF:   Your pain becomes worse.   You have an oral temperature above 102 F (38.9 C), not controlled by medicine.   You have repeated vomiting.   You have bloody or black, tarry stools.   Symptoms that brought you to your caregiver become worse or are not getting better.  MAKE SURE YOU:   Understand these instructions.   Will watch your  condition.   Will get help right away if you are not doing well or get worse.  Document Released: 12/24/2004 Document Revised: 11/26/2010 Document Reviewed: 04/21/2010 [facility] Patient Information 2012 [place], [place].

## 2011-05-18 NOTE — Progress Notes (Signed)
  Subjective:    Patient ID: Tracy Massey, female    DOB: 09-12-1950, 61 y.o.   MRN: [id number]  HPI Patient presents to the clinic with left lower abdominal pain. Patient has a history of Diverticulosis with some exacerbations of diverticulitis. She usually has a few exacerbations a year. Her trigger is usually lettuce but she denies recent lettuce intake. Friday she felt her first pains and cramps. Patient had Diarrhea all day with some blood seen when she wiped. Diarrhea has gotten much better. The pain and discomfort has been so bad that she begins to sweat. She has felt hot but has not taken her temperature. She has had chills but no nausea or vomiting. She has not been seen by GI since 2011. She continues to have pain today for 3 days.    Review of Systems     Objective:   Physical Exam  Constitutional: She is oriented to person, place, and time. She appears well-developed and well-nourished.  HENT:  Head: Normocephalic and atraumatic.  Cardiovascular: Normal rate, regular rhythm and normal heart sounds.   Pulmonary/Chest: Effort normal and breath sounds normal. She has no wheezes.  Abdominal: Bowel sounds are normal. She exhibits distension. She exhibits no mass. There is tenderness. There is guarding. There is no rebound.       Tenderness to palpation over the left lower quadrant.  Neurological: She is alert and oriented to person, place, and time.  Skin: Skin is warm and dry.  Psychiatric: She has a normal mood and affect. Her behavior is normal.          Assessment & Plan:  Diverticulitis- Cipro and Metronidazole were given for 10 days. Told to start a clear liquid only diet for 2-3 days. Gave H.O. On diverticulitis. Can begin BRAT diet as tolerated after the 3 days of clear liquids. Call office if not improving of getting worse.

## 2011-05-19 ENCOUNTER — Ambulatory Visit: Payer: BC Managed Care – PPO | Admitting: Family Medicine

## 2011-05-21 ENCOUNTER — Other Ambulatory Visit: Payer: Self-pay | Admitting: *Deleted

## 2011-05-21 MED ORDER — HYDROCODONE-IBUPROFEN 7.5-200 MG PO TABS: 1.0000 | ORAL_TABLET | Freq: Three times a day (TID) | ORAL | Status: DC | PRN

## 2011-05-26 ENCOUNTER — Encounter: Payer: Self-pay | Admitting: Family Medicine

## 2011-05-26 ENCOUNTER — Ambulatory Visit (INDEPENDENT_AMBULATORY_CARE_PROVIDER_SITE_OTHER): Payer: BC Managed Care – PPO | Admitting: Family Medicine

## 2011-05-26 VITALS — BP 137/72 | HR 55 | Ht 62.0 in | Wt 172.0 lb

## 2011-05-26 DIAGNOSIS — Z0000 Encounter for general adult medical examination without abnormal findings: Secondary | ICD-10-CM

## 2011-05-26 DIAGNOSIS — E785 Hyperlipidemia, unspecified: Secondary | ICD-10-CM

## 2011-05-26 DIAGNOSIS — I10 Essential (primary) hypertension: Secondary | ICD-10-CM

## 2011-05-26 DIAGNOSIS — K5792 Diverticulitis of intestine, part unspecified, without perforation or abscess without bleeding: Secondary | ICD-10-CM

## 2011-05-26 DIAGNOSIS — Z2839 Other underimmunization status: Secondary | ICD-10-CM

## 2011-05-26 DIAGNOSIS — E8881 Metabolic syndrome: Secondary | ICD-10-CM

## 2011-05-26 LAB — POCT URINALYSIS DIPSTICK
Protein, UA: NEGATIVE
Spec Grav, UA: >=1.03
Urobilinogen, UA: 0.2
pH, UA: 5.5

## 2011-05-26 NOTE — Patient Instructions (Signed)
Herpes Zoster Virus Vaccine What is this medicine? HERPES ZOSTER VIRUS VACCINE (HUR peez ZOS ter vahy ruhs vak SEEN) is a vaccine. It is used to prevent shingles in adults 63 years old and over. This vaccine is not used to treat shingles or nerve pain from shingles. This medicine may be used for other purposes; ask your health care provider or pharmacist if you have questions. What should I tell my health care provider before I take this medicine? They need to know if you have any of these conditions: -cancer like leukemia or lymphoma -immune system problems or therapy -infection with fever -tuberculosis -an unusual or allergic reaction to vaccines, neomycin, gelatin, other medicines, foods, dyes, or preservatives -pregnant or trying to get pregnant -breast-feeding How should I use this medicine? This vaccine is for injection under the skin. It is given by a health care professional. Talk to your pediatrician regarding the use of this medicine in children. This medicine is not approved for use in children. Overdosage: If you think you have taken too much of this medicine contact a poison control center or emergency room at once. NOTE: This medicine is only for you. Do not share this medicine with others. What if I miss a dose? This does not apply. What may interact with this medicine? Do not take this medicine with any of the following medications: -adalimumab -anakinra -etanercept -infliximab -medicines to treat cancer -medicines that suppress your immune system This medicine may also interact with the following medications: -immunoglobulins -steroid medicines like prednisone or cortisone This list may not describe all possible interactions. Give your health care provider a list of all the medicines, herbs, non-prescription drugs, or dietary supplements you use. Also tell them if you smoke, drink alcohol, or use illegal drugs. Some items may interact with your medicine. What should I  watch for while using this medicine? Visit your doctor for regular check ups. This vaccine, like all vaccines, may not fully protect everyone. After receiving this vaccine it may be possible to pass chickenpox infection to others. Avoid people with immune system problems, pregnant women who have not had chickenpox, and newborns of women who have not had chickenpox. Talk to your doctor for more information. What side effects may I notice from receiving this medicine? Side effects that you should report to your doctor or health care professional as soon as possible: -allergic reactions like skin rash, itching or hives, swelling of the face, lips, or tongue -breathing problems -feeling faint or lightheaded, falls -fever, flu-like symptoms -pain, tingling, numbness in the hands or feet -swelling of the ankles, feet, hands -unusually weak or tired Side effects that usually do not require medical attention (report to your doctor or health care professional if they continue or are bothersome): -aches or pains -chickenpox-like rash -diarrhea -headache -loss of appetite -nausea, vomiting -redness, pain, swelling at site where injected -runny nose This list may not describe all possible side effects. Call your doctor for medical advice about side effects. You may report side effects to FDA at 1-800-FDA-1088. Where should I keep my medicine? This drug is given in a hospital or clinic and will not be stored at home. NOTE: This sheet is a summary. It may not cover all possible information. If you have questions about this medicine, talk to your doctor, pharmacist, or health care provider.  2012, [employer]. (09/02/2009 5:43:50 PM) 

## 2011-05-26 NOTE — Progress Notes (Signed)
Subjective:    Patient ID: Tracy Massey, female    DOB: 21-Jan-1951, 61 y.o.   MRN: [id number]  HPI  Patient's here for physical examination. She has been in a hypertensive study but recently was placed on medication but she was unable to tolerate.   she recently was seen here for diverticulitis and placed on metronidazole and Cipro. She reports marked improvement and doing much better.  Review of Systems  Constitutional: Negative for activity change.  Gastrointestinal: Negative for abdominal pain, constipation, blood in stool, abdominal distention, anal bleeding and rectal pain.  Genitourinary: Positive for menstrual problem.       Still having some hot flashes. The Premarin was too expensive. She is going to look at some natural alternatives at this time. Do to husband's previous surgery she is currently not sexually active.  All other systems reviewed and are negative.    Allergies  Allergen Reactions  . Codeine   . Latex   . Penicillins    History   Social History  . Marital Status: Married    Spouse Name: N/A    Number of Children: N/A  . Years of Education: N/A   Occupational History  . Not on file.   Social History Main Topics  . Smoking status: Never Smoker   . Smokeless tobacco: Never Used  . Alcohol Use: Yes     rare  . Drug Use: No  . Sexually Active: No     driver for [employer], finished HS, married, chhild with spina bifada, no exercise, fair diet.   Other Topics Concern  . Not on file   Social History Narrative  . No narrative on file   Family History  Problem Relation Age of Onset  . Hypertension Mother   . Clotting disorder Mother   . Cancer Father 73    throat/died  . Hypertension Daughter   . Emphysema Paternal Grandfather   . Hypertension Brother    Past Medical History  Diagnosis Date  . Asthma   . Hyperlipidemia   . Hypertension   . Menopausal syndrome   . Neck pain, chronic   . Diverticulosis    Past Surgical  History  Procedure Date  . Tubal ligation     bilateral  . Knee arthroscopy 1983    RT knee and LT knee 1986, 1990  . Abdominal hysterectomy 1986    TAH w/o BSO/endometriosis  . Heel spur surgery 1996    LT foot  . Appendectomy 1983  . Cholecystectomy 1995    lap  . Hand surgery        BP 137/72  Pulse 55  Ht 5\' 2"  (1.575 m)  Wt 172 lb (78.019 kg)  BMI 31.46 kg/m2  SpO2 100% Objective:   Physical Exam  Vitals reviewed. Constitutional: She is oriented to person, place, and time. She appears well-developed and well-nourished.  HENT:  Head: Normocephalic.  Right Ear: External ear normal.  Left Ear: External ear normal.  Nose: Nose normal.  Mouth/Throat: Oropharynx is clear and moist.  Eyes: Pupils are equal, round, and reactive to light.  Fundoscopic exam:      The right eye shows no arteriolar narrowing and no AV nicking.       The left eye shows no arteriolar narrowing and no AV nicking.  Neck: Normal range of motion. Neck supple. No tracheal deviation present. No thyromegaly present.  Cardiovascular: Normal rate, regular rhythm, S2 normal, normal heart sounds, intact distal pulses and normal pulses.  Exam reveals no distant heart sounds.   No murmur heard. Pulses:      Femoral pulses are 2+ on the right side, and 2+ on the left side.      Dorsalis pedis pulses are 2+ on the right side, and 2+ on the left side.       Posterior tibial pulses are 2+ on the right side, and 2+ on the left side.  Pulmonary/Chest: Effort normal and breath sounds normal. No respiratory distress.  Abdominal: Soft. Bowel sounds are normal. She exhibits no distension. There is no tenderness. There is no rebound.  Genitourinary: Vagina normal.  Musculoskeletal: Normal range of motion. She exhibits no edema and no tenderness.       Healing bruise noted over the right thigh.  Neurological: She is alert and oriented to person, place, and time. She has normal reflexes. She displays normal reflexes.  No cranial nerve deficit. She exhibits normal muscle tone.  Skin: Skin is warm and dry. She is not diaphoretic.  Psychiatric: She has a normal mood and affect.   Due to patient having a rectal exam last week and Hemoccult was negative repeat was not done.       Results for orders placed in visit on 05/26/11  POCT URINALYSIS DIPSTICK      Component Value Range   Color, UA yellow     Clarity, UA clear     Glucose, UA neg     Bilirubin, UA neg     Ketones, UA neg     Spec Grav, UA >=1.030     Blood, UA mod     pH, UA 5.5     Protein, UA neg     Urobilinogen, UA 0.2     Nitrite, UA neg     Leukocytes, UA small (1+)                                                                        Assessment & Plan:  #1 patient still needs a varicella vaccination and will try to give the next visit  #2 menopausal symptoms. If supplement or other alternative treatment doesn't help may have placed on estrogen supplement  #3 health maintenance will schedule her in May for screening mammogram and bone densitometry  #4 hematuria she's had blood and urine before and the exercises also no further treatment will be started at this time  #5 diverticulitis resolved at this time  #6 hypertension continue with hypertensive study  #7 hyperlipidemia lipid and A1c will be obtained since patient may also be or have metabolic syndrome. #8 concern over possible obesity. Patient lost 5 pounds in the last few years since been much more active and careful with the diet. No further suggestions from me on this issue.  Return in 4-6 months for followup. She is appointment in March for trigger point injection.

## 2011-06-01 LAB — COMPLETE METABOLIC PANEL WITH GFR
ALT: 24 U/L (ref 0–35)
AST: 29 U/L (ref 0–37)
Albumin: 4.1 g/dL (ref 3.5–5.2)
Calcium: 9.6 mg/dL (ref 8.4–10.5)
Chloride: 105 mEq/L (ref 96–112)
Potassium: 4.5 mEq/L (ref 3.5–5.3)
Sodium: 143 mEq/L (ref 135–145)

## 2011-06-01 LAB — LIPID PANEL
Cholesterol: 171 mg/dL (ref 0–200)
LDL Cholesterol: 99 mg/dL (ref 0–99)
VLDL: 28 mg/dL (ref 0–40)

## 2011-06-02 LAB — CBC WITH DIFFERENTIAL/PLATELET
Basophils Absolute: 0.1 10*3/uL (ref 0.0–0.1)
Eosinophils Absolute: 0.3 10*3/uL (ref 0.0–0.7)
Eosinophils Relative: 3 % (ref 0–5)
HCT: 40.4 % (ref 36.0–46.0)
Lymphs Abs: 2.6 10*3/uL (ref 0.7–4.0)
MCH: 28.6 pg (ref 26.0–34.0)
MCV: 96.2 fL (ref 78.0–100.0)
Monocytes Absolute: 0.6 10*3/uL (ref 0.1–1.0)
Platelets: 273 10*3/uL (ref 150–400)
RDW: 14.6 % (ref 11.5–15.5)

## 2011-06-05 ENCOUNTER — Telehealth: Payer: Self-pay | Admitting: *Deleted

## 2011-06-05 NOTE — Telephone Encounter (Signed)
Pt is calling about her lab results. Please advise

## 2011-06-05 NOTE — Telephone Encounter (Signed)
Please give my apologize because she should have gotten a letter from me and something must have happened. Please offer her a copy of her labs and let her know that her cholesterol values were good. Her A1C was at the higher ends of normal but still normal so I am not worried about diabetes. Her creatinine was shows her kidney function was slightly elevated at 1.58 but this is still not nearly as high as it has been before. This is something that we will need to keep a non-and one reason why is important to keep her blood pressure under good control. He

## 2011-06-05 NOTE — Telephone Encounter (Signed)
Pt informed and labs mailed

## 2011-06-24 ENCOUNTER — Other Ambulatory Visit: Payer: Self-pay | Admitting: *Deleted

## 2011-06-24 MED ORDER — HYDROCODONE-IBUPROFEN 7.5-200 MG PO TABS: 1.0000 | ORAL_TABLET | Freq: Three times a day (TID) | ORAL | Status: DC | PRN

## 2011-06-25 ENCOUNTER — Ambulatory Visit: Payer: BC Managed Care – PPO | Admitting: Family Medicine

## 2011-06-30 ENCOUNTER — Ambulatory Visit (INDEPENDENT_AMBULATORY_CARE_PROVIDER_SITE_OTHER): Payer: BC Managed Care – PPO | Admitting: Family Medicine

## 2011-06-30 ENCOUNTER — Encounter: Payer: Self-pay | Admitting: Family Medicine

## 2011-06-30 VITALS — BP 156/82 | HR 58 | Ht 62.0 in

## 2011-06-30 DIAGNOSIS — M62838 Other muscle spasm: Secondary | ICD-10-CM

## 2011-06-30 DIAGNOSIS — T148XXA Other injury of unspecified body region, initial encounter: Secondary | ICD-10-CM

## 2011-06-30 DIAGNOSIS — R0989 Other specified symptoms and signs involving the circulatory and respiratory systems: Secondary | ICD-10-CM

## 2011-06-30 DIAGNOSIS — M549 Dorsalgia, unspecified: Secondary | ICD-10-CM

## 2011-06-30 DIAGNOSIS — IMO0001 Reserved for inherently not codable concepts without codable children: Secondary | ICD-10-CM

## 2011-06-30 DIAGNOSIS — M542 Cervicalgia: Secondary | ICD-10-CM

## 2011-06-30 DIAGNOSIS — M62830 Muscle spasm of back: Secondary | ICD-10-CM

## 2011-06-30 MED ORDER — HYDROCODONE-IBUPROFEN 7.5-200 MG PO TABS: 1.0000 | ORAL_TABLET | Freq: Three times a day (TID) | ORAL | Status: DC | PRN

## 2011-06-30 NOTE — Patient Instructions (Signed)
Trigger Point Injection Trigger points are areas where you have muscle pain. A trigger point injection is a shot given in the trigger point to relieve that pain. A trigger point might feel like a knot in your muscle. It hurts to press on a trigger point. Sometimes the pain spreads out (radiates) to other parts of the body. For example, pressing on a trigger point in your shoulder might cause pain in your arm or neck. You might have one trigger point. Or, you might have more than one. People often have trigger points in their upper back and lower back. They also occur often in the neck and shoulders. Pain from a trigger point lasts for a long time. It can make it hard to keep moving. You might not be able to do the exercise or physical therapy that could help you deal with the pain. A trigger point injection may help. It does not work for everyone. But, it may relieve your pain for a few days or a few months. A trigger point injection does not cure long-lasting (chronic) pain. LET YOUR CAREGIVER KNOW ABOUT:  Any allergies (especially to latex, lidocaine, or steroids).   Blood-thinning medicines that you take. These drugs can lead to bleeding or bruising after an injection. They include:   Aspirin.   Ibuprofen.   Clopidogrel.   Warfarin.   Other medicines you take. This includes all vitamins, herbs, eyedrops, over-the-counter medicines, and creams.   Use of steroids.   Recent infections.   Past problems with numbing medicines.   Bleeding problems.   Surgeries you have had.   Other health problems.  RISKS AND COMPLICATIONS A trigger point injection is a safe treatment. However, problems may develop, such as:  Minor side effects usually go away in 1 to 2 days. These may include:   Soreness.   Bruising.   Stiffness.   More serious problems are rare. But, they may include:   Bleeding under the skin (hematoma).   Skin infection.   Breaking off of the needle under your skin.     Lung puncture.   The trigger point injection may not work for you.  BEFORE THE PROCEDURE You may need to stop taking any medicine that thins your blood. This is to prevent bleeding and bruising. Usually these medicines are stopped several days before the injection. No other preparation is needed. PROCEDURE  A trigger point injection can be given in your caregiver's office or in a clinic. Each injection takes 2 minutes or less.  Your caregiver will feel for trigger points. The caregiver may use a marker to circle the area for the injection.   The skin over the trigger point will be washed with a germ-killing (antiseptic) solution.   The caregiver pinches the spot for the injection.   Then, a very thin needle is used for the shot. You may feel pain or a twitching feeling when the needle enters the trigger point.   A numbing solution may be injected into the trigger point. Sometimes a drug to keep down swelling, redness, and warmth (inflammation) is also injected.   Your caregiver moves the needle around the trigger zone until the tightness and twitching goes away.   After the injection, your caregiver may put gentle pressure over the injection site.   Then it is covered with a bandage.  AFTER THE PROCEDURE  You can go right home after the injection.   The bandage can be taken off after a few hours.     You may feel sore and stiff for 1 to 2 days.   Go back to your regular activities slowly. Your caregiver may ask you to stretch your muscles. Do not do anything that takes extra energy for a few days.   Follow your caregiver's instructions to manage and treat other pain.  Document Released: 03/05/2011 Document Reviewed: 03/04/2011 [employer] Patient Information 2012 [employer]. 

## 2011-06-30 NOTE — Assessment & Plan Note (Signed)
Trigger Point Injection   Pre-operative diagnosis: myofascial pain  Post-operative diagnosis: myofascial pain  After risks and benefits were explained including bleeding, infection, worsening of symptoms..  All questions were answered.  Patient reported a new trigger point over her right iliac sacral crest that she's had treated before but I had not injected before.  The area of the trigger point was identified and the skin prepped three times with Betadine and  allowed to dry. There were 5 triggerpoints file along the left neck and shoulder and 4 more over the right neck and shoulder. Including the one over the right iliac sacral crest a total of 10. Next, a 22 gauge 1 inch needle was placed in the area of the trigger point.  Once reproduction of the pain was elicited and negative aspiration confirmed, the trigger point was injected and the needle removed.    The patient did tolerate the procedure well and there were not complications.    Medication used: 1 ml of Depo-Medrol 80 mg/mL , 1 mL of Toradol 30 mg/mL and ; 20ML's of 1% Xylocaine with epinephrine was drawn up in roughly 15 ML's were injected  out of the22 ML'S.  Trigger points injected: 8    Trigger point(s) location(s):  bilateral as mentioned above 5 upper neck and shoulder, 4 over right neck and shoulder in 1 over right iliac sacral crest with marked improvement.
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
## 2011-07-03 ENCOUNTER — Other Ambulatory Visit: Payer: Self-pay | Admitting: Family Medicine

## 2011-07-27 ENCOUNTER — Other Ambulatory Visit: Payer: Self-pay | Admitting: Family Medicine

## 2011-07-29 ENCOUNTER — Other Ambulatory Visit: Payer: Self-pay | Admitting: *Deleted

## 2011-07-29 DIAGNOSIS — F329 Major depressive disorder, single episode, unspecified: Secondary | ICD-10-CM

## 2011-07-29 DIAGNOSIS — F32A Depression, unspecified: Secondary | ICD-10-CM

## 2011-07-29 MED ORDER — BUPROPION HCL ER (SR) 150 MG PO TB12: 150.0000 mg | ORAL_TABLET | Freq: Two times a day (BID) | ORAL | Status: DC

## 2011-07-29 MED ORDER — PRAVASTATIN SODIUM 40 MG PO TABS: 40.0000 mg | ORAL_TABLET | Freq: Every day | ORAL | Status: DC

## 2011-07-29 MED ORDER — HYDROCODONE-IBUPROFEN 7.5-200 MG PO TABS: 1.0000 | ORAL_TABLET | Freq: Three times a day (TID) | ORAL | Status: DC | PRN

## 2011-08-28 ENCOUNTER — Other Ambulatory Visit: Payer: Self-pay | Admitting: Family Medicine

## 2011-09-02 ENCOUNTER — Other Ambulatory Visit: Payer: Self-pay | Admitting: Family Medicine

## 2011-10-06 ENCOUNTER — Encounter: Payer: Self-pay | Admitting: Family Medicine

## 2011-10-06 ENCOUNTER — Ambulatory Visit (INDEPENDENT_AMBULATORY_CARE_PROVIDER_SITE_OTHER): Payer: BC Managed Care – PPO | Admitting: Family Medicine

## 2011-10-06 VITALS — BP 163/76 | HR 55 | Ht 62.0 in | Wt 176.0 lb

## 2011-10-06 DIAGNOSIS — Z2839 Other underimmunization status: Secondary | ICD-10-CM

## 2011-10-06 DIAGNOSIS — R7309 Other abnormal glucose: Secondary | ICD-10-CM

## 2011-10-06 DIAGNOSIS — R7989 Other specified abnormal findings of blood chemistry: Secondary | ICD-10-CM

## 2011-10-06 DIAGNOSIS — M549 Dorsalgia, unspecified: Secondary | ICD-10-CM

## 2011-10-06 DIAGNOSIS — Z23 Encounter for immunization: Secondary | ICD-10-CM

## 2011-10-06 DIAGNOSIS — IMO0001 Reserved for inherently not codable concepts without codable children: Secondary | ICD-10-CM

## 2011-10-06 DIAGNOSIS — Z283 Underimmunization status: Secondary | ICD-10-CM

## 2011-10-06 DIAGNOSIS — E785 Hyperlipidemia, unspecified: Secondary | ICD-10-CM

## 2011-10-06 LAB — POCT GLYCOSYLATED HEMOGLOBIN (HGB A1C): Hemoglobin A1C: 5.6

## 2011-10-06 MED ORDER — ORPHENADRINE CITRATE ER 100 MG PO TB12: 100.0000 mg | ORAL_TABLET | Freq: Two times a day (BID) | ORAL | Status: AC

## 2011-10-06 MED ORDER — PREDNISONE (PAK) 10 MG PO TABS: 10.0000 mg | ORAL_TABLET | Freq: Every day | ORAL | Status: AC

## 2011-10-06 MED ORDER — HYDROCODONE-IBUPROFEN 7.5-200 MG PO TABS: 1.0000 | ORAL_TABLET | Freq: Three times a day (TID) | ORAL | Status: DC | PRN

## 2011-10-06 MED ORDER — PRAVASTATIN SODIUM 40 MG PO TABS: 40.0000 mg | ORAL_TABLET | Freq: Every day | ORAL | Status: DC
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
## 2011-10-06 NOTE — Patient Instructions (Signed)
Back Pain, Adult Low back pain is very common. About 1 in 5 people have back pain.The cause of low back pain is rarely dangerous. The pain often gets better over time.About half of people with a sudden onset of back pain feel better in just 2 weeks. About 8 in 10 people feel better by 6 weeks.  CAUSES Some common causes of back pain include:  Strain of the muscles or ligaments supporting the spine.   Wear and tear (degeneration) of the spinal discs.   Arthritis.   Direct injury to the back.  DIAGNOSIS Most of the time, the direct cause of low back pain is not known.However, back pain can be treated effectively even when the exact cause of the pain is unknown.Answering your caregiver's questions about your overall health and symptoms is one of the most accurate ways to make sure the cause of your pain is not dangerous. If your caregiver needs more information, he or she may order lab work or imaging tests (X-rays or MRIs).However, even if imaging tests show changes in your back, this usually does not require surgery. HOME CARE INSTRUCTIONS For many people, back pain returns.Since low back pain is rarely dangerous, it is often a condition that people can learn to manageon their own.   Remain active. It is stressful on the back to sit or stand in one place. Do not sit, drive, or stand in one place for more than 30 minutes at a time. Take short walks on level surfaces as soon as pain allows.Try to increase the length of time you walk each day.   Do not stay in bed.Resting more than 1 or 2 days can delay your recovery.   Do not avoid exercise or work.Your body is made to move.It is not dangerous to be active, even though your back may hurt.Your back will likely heal faster if you return to being active before your pain is gone.   Pay attention to your body when you bend and lift. Many people have less discomfortwhen lifting if they bend their knees, keep the load close to their  bodies,and avoid twisting. Often, the most comfortable positions are those that put less stress on your recovering back.   Find a comfortable position to sleep. Use a firm mattress and lie on your side with your knees slightly bent. If you lie on your back, put a pillow under your knees.   Only take over-the-counter or prescription medicines as directed by your caregiver. Over-the-counter medicines to reduce pain and inflammation are often the most helpful.Your caregiver may prescribe muscle relaxant drugs.These medicines help dull your pain so you can more quickly return to your normal activities and healthy exercise.   Put ice on the injured area.   Put ice in a plastic bag.   Place a towel between your skin and the bag.   Leave the ice on for 15 to 20 minutes, 3 to 4 times a day for the first 2 to 3 days. After that, ice and heat may be alternated to reduce pain and spasms.   Ask your caregiver about trying back exercises and gentle massage. This may be of some benefit.   Avoid feeling anxious or stressed.Stress increases muscle tension and can worsen back pain.It is important to recognize when you are anxious or stressed and learn ways to manage it.Exercise is a great option.  SEEK MEDICAL CARE IF:  You have pain that is not relieved with rest or medicine.   You have   pain that does not improve in 1 week.   You have new symptoms.   You are generally not feeling well.  SEEK IMMEDIATE MEDICAL CARE IF:   You have pain that radiates from your back into your legs.   You develop new bowel or bladder control problems.   You have unusual weakness or numbness in your arms or legs.   You develop nausea or vomiting.   You develop abdominal pain.   You feel faint.  Document Released: 03/16/2005 Document Revised: 03/05/2011 Document Reviewed: 08/04/2010 [facility] Patient Information 2012 [place], [place].Varicella Virus Vaccine Live injection What is this medicine? VARICELLA VIRUS  VACCINE (var uh SEL uh VAHY ruhs vak SEEN) is used to prevent infections of chickpox. This medicine may be used for other purposes; ask your health care provider or pharmacist if you have questions. What should I tell my health care provider before I take this medicine? They need to know if you have any of the following conditions: -blood disorders or disease -cancer like leukemia or lymphoma -immune system problems or therapy -infection with fever -recent immune globulin therapy -tuberculosis -an unusual or allergic reaction to vaccines, neomycin, gelatin, other medicines, foods, dyes, or preservatives -pregnant or trying to get pregnant -breast-feeding How should I use this medicine? This vaccine is for injection under the skin. It is given by a health care professional. A copy of Vaccine Information Statements will be given before each vaccination. Read this sheet carefully each time. The sheet may change frequently. Talk to your pediatrician regarding the use of this medicine in children. While this drug may be prescribed for children as young as 87 months of age for selected conditions, precautions do apply. Overdosage: If you think you have taken too much of this medicine contact a poison control center or emergency room at once. NOTE: This medicine is only for you. Do not share this medicine with others. What if I miss a dose? Keep appointments for follow-up (booster) doses as directed. It is important not to miss your dose. Call your doctor or health care professional if you are unable to keep an appointment. What may interact with this medicine? Do not take this medicine with any of the following medications: -adalimumab -anakinra -etanercept -infliximab -medicines that suppress your immune system This medicine may also interact with the following medications: -aspirin and aspirin-like medicines -blood transfusions -immunoglobulins -medicines to treat cancer -steroid  medicines like prednisone or cortisone This list may not describe all possible interactions. Give your health care provider a list of all the medicines, herbs, non-prescription drugs, or dietary supplements you use. Also tell them if you smoke, drink alcohol, or use illegal drugs. Some items may interact with your medicine. What should I watch for while using this medicine? Visit your doctor for regular check ups. This vaccine, like all vaccines, may not fully protect everyone. After receiving this vaccine it may be possible to pass chickenpox infection to others. For up to 6 weeks, avoid people with immune system problems, pregnant women who have not had chickenpox, and newborns of women who have not had chickenpox. Talk to your doctor for more information. Do not become pregnant for 3 months after taking this vaccine. Women should inform their doctor if they wish to become pregnant or think they might be pregnant. There is a potential for serious side effects to an unborn child. Talk to your health care professional or pharmacist for more information. What side effects may I notice from receiving this medicine? Side  effects that you should report to your doctor or health care professional as soon as possible: -allergic reactions like skin rash, itching or hives, swelling of the face, lips, or tongue -breathing problems -extreme changes in behavior -feeling faint or lightheaded, falls -fever over 102 degrees F -pain, tingling, numbness in the hands or feet -redness, blistering, peeling or loosening of the skin, including inside the mouth -seizures -unusually weak or tired Side effects that usually do not require medical attention (report to your doctor or health care professional if they continue or are bothersome): -aches or pains -chickenpox-like rash -diarrhea -low-grade fever under 102 degrees F -loss of appetite -nausea, vomiting -redness, pain, swelling at site where  injected -sleepy -trouble sleeping This list may not describe all possible side effects. Call your doctor for medical advice about side effects. You may report side effects to FDA at 1-800-FDA-1088. Where should I keep my medicine? This drug is given in a hospital or clinic and will not be stored at home. NOTE: This sheet is a summary. It may not cover all possible information. If you have questions about this medicine, talk to your doctor, pharmacist, or health care provider.  2012, [employer]. (12/12/2007 5:19:05 PM)

## 2011-10-24 ENCOUNTER — Other Ambulatory Visit: Payer: Self-pay | Admitting: Physician Assistant

## 2011-10-24 ENCOUNTER — Other Ambulatory Visit: Payer: Self-pay | Admitting: Family Medicine

## 2011-10-28 ENCOUNTER — Other Ambulatory Visit: Payer: Self-pay | Admitting: Physician Assistant

## 2011-11-05 ENCOUNTER — Ambulatory Visit: Payer: BC Managed Care – PPO | Admitting: Family Medicine

## 2011-11-10 ENCOUNTER — Ambulatory Visit: Payer: BC Managed Care – PPO | Admitting: Family Medicine

## 2011-11-12 ENCOUNTER — Ambulatory Visit (INDEPENDENT_AMBULATORY_CARE_PROVIDER_SITE_OTHER): Payer: BC Managed Care – PPO | Admitting: Family Medicine

## 2011-11-12 ENCOUNTER — Encounter: Payer: Self-pay | Admitting: Family Medicine

## 2011-11-12 VITALS — BP 131/82 | HR 70 | Ht 62.0 in | Wt 181.0 lb

## 2011-11-12 DIAGNOSIS — L988 Other specified disorders of the skin and subcutaneous tissue: Secondary | ICD-10-CM

## 2011-11-12 DIAGNOSIS — M542 Cervicalgia: Secondary | ICD-10-CM

## 2011-11-12 DIAGNOSIS — G43909 Migraine, unspecified, not intractable, without status migrainosus: Secondary | ICD-10-CM

## 2011-11-12 DIAGNOSIS — I10 Essential (primary) hypertension: Secondary | ICD-10-CM

## 2011-11-12 DIAGNOSIS — L259 Unspecified contact dermatitis, unspecified cause: Secondary | ICD-10-CM

## 2011-11-12 DIAGNOSIS — L959 Vasculitis limited to the skin, unspecified: Secondary | ICD-10-CM

## 2011-11-12 MED ORDER — ZOLPIDEM TARTRATE 10 MG PO TABS: 10.0000 mg | ORAL_TABLET | Freq: Every evening | ORAL | Status: DC | PRN

## 2011-11-12 NOTE — Patient Instructions (Signed)
Contact Dermatitis  Contact dermatitis is a rash that happens when something touches the skin. You touched something that irritates your skin, or you have allergies to something you touched.  HOME CARE    Avoid the thing that caused your rash.   Keep your rash away from hot water, soap, sunlight, chemicals, and other things that might bother it.   Do not scratch your rash.   You can take cool baths to help stop itching.   Only take medicine as told by your doctor.   Keep all doctor visits as told.  GET HELP RIGHT AWAY IF:    Your rash is not better after 3 days.   Your rash gets worse.   Your rash is puffy (swollen), tender, red, sore, or warm.   You have problems with your medicine.  MAKE SURE YOU:    Understand these instructions.   Will watch your condition.   Will get help right away if you are not doing well or get worse.  Document Released: 01/11/2009 Document Revised: 03/05/2011 Document Reviewed: 08/19/2010  [employer] Patient Information 2012 [employer].

## 2011-11-13 NOTE — Progress Notes (Signed)
Subjective:    Patient ID: Tracy Massey, female    DOB: 27-Dec-1950, 61 y.o.   MRN: [id number]  HPI  #1 triggerpoints. The patient's here to have multiple trigger points injected. #2 followup of hypertension. She's had some fluctuations with a blood pressure this occurred while she was having recurrent/persistent migraines as more muscle spasm and irritation occurred around her neck. She also reports that the study group at [facility] has stopped the hydralazine 50 mg twice a day and is placed her on Norvasc 5 mg 2 tablets daily. Currently she still taking Toprol-XL 50 and 25 and Zestril 40 mg a day.  #3 rash. She has developed a rash on her lower extremities as well as swelling. She states that at her job she has to wear booties and that the option is now a [employer] product that they're not sure if it has latex or not and latex free booties that do not stay on her shoes because of her small feet.   Review of Systems  HENT: Positive for neck pain and neck stiffness.   Musculoskeletal: Positive for joint swelling.  Skin: Positive for rash.  Neurological: Positive for headaches.  All other systems reviewed and are negative.      BP 131/82  Pulse 70  Ht 5\' 2"  (1.575 m)  Wt 181 lb (82.101 kg)  BMI 33.11 kg/m2  SpO2 95% Objective:   Physical Exam  Constitutional: She is oriented to person, place, and time. She appears well-developed and well-nourished.  HENT:  Head: Normocephalic.  Neck: Normal range of motion. Neck supple.  Musculoskeletal: Normal range of motion. She exhibits edema and tenderness.       Tenderness along multiple triggerpoints on both sides of the neck approximately 15-20  Neurological: She is alert and oriented to person, place, and time.  Skin: Rash noted.       Rash as a vasculitis-type rash consistent with allergic reaction.      Assessment & Plan:  # rash/vasculitis/allergic reaction. Offered place on prednisone. She declines due to side effects and  wants to continue using Benadryl. She states that the Claritin and Allegra or not effective but the Benadryl did help. Stressed the need to avoid contact with the skin and latex /[employer] booties . The rash appears to be just above the top of her socks so recommended knee-high socks or stockings and if the rash does not go away to notify me to be put on prednisone.   #2 hypertension appears to be under relatively good control except when she was suffering from her migraines. Her blood pressure  according to her log ranged from 107/73 pulse of 59-55 on 8/10 to 163/103 pulse 60 and 156/101 on 8/13 and 8/14 while she was having trouble with her migraines. Pulses at that time ranged between 53 and 69.     #3 trigger Point Injection   Pre-operative diagnosis: myofascial pain  Post-operative diagnosis: myofascial pain  After risks and benefits were explained including bleeding, infection, worsening of the pain, damage to the area being injected, weakness, allergic reaction to medications, vascular injection, and nerve damage, signed consent was obtained.  All questions were answered.   three times with alcohol  The area of the trigger point was identified and the skin prepped two times with Betadine and allowed to dry.  Next, a 25 gauge 1 inch needle was placed in the area of the trigger point.  Once reproduction of the pain was elicited and negative aspiration confirmed, the trigger  point was injected and the needle removed.    The patient did tolerate the procedure well and there were not any complications.    Medication used: 80 mg of Decadron, 60 mg of Toradol; 20 mL of 1% Mepivicaine mixed together. 50 Trigger points injected:  15-20  Trigger point(s) location(s):  On both sides of the neck and shoulder.

## 2011-11-13 NOTE — Assessment & Plan Note (Signed)
Trigger Point Injection   Pre-operative diagnosis: myofascial pain  Post-operative diagnosis: myofascial pain  After risks and benefits were explained including bleeding, infection, worsening of the pain, damage to the area being injected, weakness, allergic reaction to medications, vascular injection, and nerve damage, signed consent was obtained.  All questions were answered.   three times with alcohol  The area of the trigger point was identified and the skin prepped two times with Betadine and allowed to dry.  Next, a 25 gauge 1 inch needle was placed in the area of the trigger point.  Once reproduction of the pain was elicited and negative aspiration confirmed, the trigger point was injected and the needle removed.    The patient did tolerate the procedure well and there were not any complications.    Medication used: 80 mg of Decadron, 60 mg of Toradol; 20 mL of 1% Mepivicaine mixed together. 50 Trigger points injected:  15-20  Trigger point(s) location(s):  On both sides of the neck and shoulder.

## 2011-11-17 ENCOUNTER — Ambulatory Visit (INDEPENDENT_AMBULATORY_CARE_PROVIDER_SITE_OTHER): Payer: BC Managed Care – PPO

## 2011-11-17 DIAGNOSIS — Z0000 Encounter for general adult medical examination without abnormal findings: Secondary | ICD-10-CM

## 2011-11-17 DIAGNOSIS — Z1231 Encounter for screening mammogram for malignant neoplasm of breast: Secondary | ICD-10-CM

## 2011-11-19 ENCOUNTER — Other Ambulatory Visit: Payer: Self-pay | Admitting: *Deleted

## 2011-11-19 DIAGNOSIS — F32A Depression, unspecified: Secondary | ICD-10-CM

## 2011-11-19 DIAGNOSIS — F329 Major depressive disorder, single episode, unspecified: Secondary | ICD-10-CM

## 2011-11-19 MED ORDER — BUPROPION HCL ER (SR) 150 MG PO TB12: 150.0000 mg | ORAL_TABLET | Freq: Two times a day (BID) | ORAL | Status: DC

## 2011-11-20 ENCOUNTER — Telehealth: Payer: Self-pay | Admitting: *Deleted

## 2011-11-20 DIAGNOSIS — R059 Cough, unspecified: Secondary | ICD-10-CM

## 2011-11-20 DIAGNOSIS — R05 Cough: Secondary | ICD-10-CM

## 2011-11-20 DIAGNOSIS — J309 Allergic rhinitis, unspecified: Secondary | ICD-10-CM

## 2011-11-20 NOTE — Telephone Encounter (Signed)
Pt of Dr. Warden Fillers. She called asking if Dr. Thurmond Butts would fill an rx for her for tessie caps. States she has the allergic rhinitis and that she has tessalon pearls but wants him to fill the other. She apparently got the rx for a specialist previously. Please advise.

## 2011-11-20 NOTE — Telephone Encounter (Signed)
I have no clue what Tracy Massey are? I have tried to look up but don't see anything. I will route to Dr. Thurmond Butts and she if he know what she is talking about.

## 2011-11-24 ENCOUNTER — Other Ambulatory Visit: Payer: Self-pay | Admitting: *Deleted

## 2011-11-24 DIAGNOSIS — F329 Major depressive disorder, single episode, unspecified: Secondary | ICD-10-CM

## 2011-11-24 DIAGNOSIS — F32A Depression, unspecified: Secondary | ICD-10-CM

## 2011-11-24 MED ORDER — BUPROPION HCL ER (SR) 150 MG PO TB12: 150.0000 mg | ORAL_TABLET | Freq: Two times a day (BID) | ORAL | Status: DC

## 2011-11-24 NOTE — Telephone Encounter (Signed)
I am afraid I will need some clarification about this medication as well. She is going to have to spell it out for [place] but I'm glad to let her have it if I knew what it was.

## 2011-11-25 MED ORDER — HYDROCOD POLST-CPM POLST ER 10-8 MG PO CP12: 1.0000 | ORAL_CAPSULE | Freq: Two times a day (BID) | ORAL | Status: DC | PRN

## 2011-11-25 NOTE — Telephone Encounter (Signed)
Per EMR Dr. Delford Field Rxd Tussicaps 1 cap po bid prn cough/allergic rhinitis. I entered and filled exactly what was documented in EMR. #20 w/ 1 refill was sent to pharm for Pt.

## 2011-11-26 NOTE — Telephone Encounter (Signed)
Great thank you for the interpretation.

## 2011-12-17 ENCOUNTER — Encounter: Payer: Self-pay | Admitting: Family Medicine

## 2011-12-17 ENCOUNTER — Ambulatory Visit (INDEPENDENT_AMBULATORY_CARE_PROVIDER_SITE_OTHER): Payer: BC Managed Care – PPO | Admitting: Family Medicine

## 2011-12-17 VITALS — BP 125/67 | HR 64 | Ht 62.0 in | Wt 182.0 lb

## 2011-12-17 DIAGNOSIS — R609 Edema, unspecified: Secondary | ICD-10-CM

## 2011-12-17 DIAGNOSIS — Z23 Encounter for immunization: Secondary | ICD-10-CM

## 2011-12-17 DIAGNOSIS — R21 Rash and other nonspecific skin eruption: Secondary | ICD-10-CM

## 2011-12-17 MED ORDER — PREDNISONE (PAK) 10 MG PO TABS: 10.0000 mg | ORAL_TABLET | Freq: Every day | ORAL | Status: DC

## 2011-12-17 MED ORDER — TORSEMIDE 20 MG PO TABS: 20.0000 mg | ORAL_TABLET | Freq: Every day | ORAL | Status: DC

## 2011-12-17 MED ORDER — POTASSIUM CHLORIDE ER 10 MEQ PO TBCR: 20.0000 meq | EXTENDED_RELEASE_TABLET | Freq: Every day | ORAL | Status: DC

## 2011-12-17 MED ORDER — SODIUM CHLORIDE 0.9 % IV SOLN: 125.0000 mg | Freq: Once | INTRAVENOUS | Status: DC

## 2011-12-17 MED ORDER — METHYLPREDNISOLONE SODIUM SUCC 125 MG IJ SOLR
125.0000 mg | Freq: Once | INTRAMUSCULAR | Status: AC
  Administered 2011-12-17: 125 mg via INTRAMUSCULAR

## 2011-12-17 NOTE — Addendum Note (Signed)
Addended by: Ellsworth Lennox on: 12/17/2011 12:57 PM   Modules accepted: Orders

## 2011-12-17 NOTE — Progress Notes (Signed)
  Subjective:    Patient ID: Tracy Massey, female    DOB: 06-19-1950, 61 y.o.   MRN: [id number]  HPI Patient is here because of a rash on both legs. We initially thought that the rash was being caused by the shoe coverings that she's had to wear but despite her making changes wearing latex free shoe coverings the rash has continued. In fact the rash is gotten worse and is accompanied with swelling of both legs and tenderness as well. This rash thousand and present for about 6 weeks..   Review of Systems  Constitutional: Positive for activity change.  Musculoskeletal: Positive for myalgias.       Edema in both legs.  Skin: Positive for rash.      BP 125/67  Pulse 64  Ht 5\' 2"  (1.575 m)  Wt 182 lb (82.555 kg)  BMI 33.29 kg/m2  SpO2 96% Objective:   Physical Exam  Constitutional: She appears well-developed and well-nourished.  HENT:  Head: Normocephalic.  Cardiovascular: Normal rate, regular rhythm and normal heart sounds.   Musculoskeletal: She exhibits edema.       1+ to 2+ edema in both legs. The rash is more intense and redder on the right leg than on the left  Skin: Rash noted. There is erythema.  Psychiatric: She has a normal mood and affect. Her behavior is normal.      Assessment & Plan:  #1 allergic reaction. The swelling and the rash are secondary to some type of contact dermatitis. One challenge is to try to ascertain what the offending agent is. Since we may never figure that out we will go ahead and treat empirically. We will place her on Demadex 20 mg one tablet a day with 20 mEq of KCl for the next 1-2 weeks until the swelling is gone, Solu-Medrol 125 mg IM and then a 12 day taper of prednisone starting at 60 mg tomorrow. I recommend she notify the [facility] hypertension study group this would be placed on Demadex for the swelling. Her blood pressure today is excellent. If she is not better in 2 weeks to please notify me and we'll probably get a dermatology consult at  that time.   #2 immunization update will minister flu shot today.    Marland Kitchen

## 2011-12-17 NOTE — Patient Instructions (Signed)
Rash A rash is a change in the color or texture of your skin. There are many different types of rashes. You may have other problems that accompany your rash. CAUSES   Infections.   Allergic reactions. This can include allergies to pets or foods.   Certain medicines.   Exposure to certain chemicals, soaps, or cosmetics.   Heat.   Exposure to poisonous plants.   Tumors, both cancerous and noncancerous.  SYMPTOMS   Redness.   Scaly skin.   Itchy skin.   Dry or cracked skin.   Bumps.   Blisters.   Pain.  DIAGNOSIS  Your caregiver may do a physical exam to determine what type of rash you have. A skin sample (biopsy) may be taken and examined under a microscope. TREATMENT  Treatment depends on the type of rash you have. Your caregiver may prescribe certain medicines. For serious conditions, you may need to see a skin doctor (dermatologist). HOME CARE INSTRUCTIONS   Avoid the substance that caused your rash.   Do not scratch your rash. This can cause infection.   You may take cool baths to help stop itching.   Only take over-the-counter or prescription medicines as directed by your caregiver.   Keep all follow-up appointments as directed by your caregiver.  SEEK IMMEDIATE MEDICAL CARE IF:  You have increasing pain, swelling, or redness.   You have a fever.   You have new or severe symptoms.   You have body aches, diarrhea, or vomiting.   Your rash is not better after 3 days.  MAKE SURE YOU:  Understand these instructions.   Will watch your condition.   Will get help right away if you are not doing well or get worse.  Document Released: 03/06/2002 Document Revised: 03/05/2011 Document Reviewed: 12/29/2010 Exit[employer] Patient Information 2012 [employer]. 

## 2012-02-02 ENCOUNTER — Other Ambulatory Visit: Payer: Self-pay | Admitting: Family Medicine

## 2012-02-03 NOTE — Telephone Encounter (Signed)
Tracy Massey can you look and see if ok to fill

## 2012-02-09 ENCOUNTER — Ambulatory Visit (INDEPENDENT_AMBULATORY_CARE_PROVIDER_SITE_OTHER): Payer: BC Managed Care – PPO | Admitting: Family Medicine

## 2012-02-09 ENCOUNTER — Encounter: Payer: Self-pay | Admitting: Family Medicine

## 2012-02-09 VITALS — BP 93/55 | HR 54 | Ht 62.0 in | Wt 173.0 lb

## 2012-02-09 DIAGNOSIS — M791 Myalgia, unspecified site: Secondary | ICD-10-CM

## 2012-02-09 DIAGNOSIS — I10 Essential (primary) hypertension: Secondary | ICD-10-CM

## 2012-02-09 DIAGNOSIS — IMO0001 Reserved for inherently not codable concepts without codable children: Secondary | ICD-10-CM

## 2012-02-09 DIAGNOSIS — R609 Edema, unspecified: Secondary | ICD-10-CM

## 2012-02-09 MED ORDER — HYDROCODONE-IBUPROFEN 7.5-200 MG PO TABS: 1.0000 | ORAL_TABLET | Freq: Four times a day (QID) | ORAL | Status: DC | PRN

## 2012-02-09 NOTE — Progress Notes (Signed)
  Subjective:    Patient ID: Tracy Massey, female    DOB: 01/24/1951, 61 y.o.   MRN: [id number]  HPI #1 hypertension. She reports that the study at [facility] they've made some significant changes with her blood pressure medicine and seemed to have had improvement with her blood pressure dramatically.  #2 she reports rash on legs have resolved after the use of prednisone. She still having some swelling and fluid retention in her legs. 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 Since she does not do a lot of walking there is no return help from her lower leg muscles. Explained to her since she is older she may not tolerate that like she used to.  #3 increased back pain neck pain. She reports that they're Vicon Forte 3 times a day due to active life and prolonged use is not controlling her pain like it did before. At one time she was taking Norco 10 four times a day and she wondered if she should go back to that medication.    Review of Systems  Cardiovascular: Positive for leg swelling.  Musculoskeletal: Positive for myalgias, back pain and joint swelling.      BP 93/55  Pulse 54  Ht 5\' 2"  (1.575 m)  Wt 173 lb (78.472 kg)  BMI 31.64 kg/m2 Objective:   Physical Exam  Constitutional: She is oriented to person, place, and time. She appears well-developed.  HENT:  Head: Normocephalic.  Cardiovascular: Normal rate and regular rhythm.   Murmur heard. Pulmonary/Chest: Effort normal and breath sounds normal.  Musculoskeletal: She exhibits edema.  Neurological: She is alert and oriented to person, place, and time.  Skin: Skin is warm and dry.  Psychiatric: She has a normal mood and affect.      Assessment & Plan:  #1 hypertension. With simplification of patient's blood pressure medication we've had the best readings. Maintain current therapy. #2 edema. We'll place on Demadex which she has. Instead of using it daily I will recommend that she  use it twice a week and see if this keeps the fluid off of her legs. Hopefully this low dose will not cause any hypotension. #3 for increasing neck/back pain will increase Vicon Forte to one tablet 4 times a day as needed.  Return in 4 months for visit. Return in one month for trigger point injections. Patient also notified of providers departure from this practice.

## 2012-02-09 NOTE — Patient Instructions (Signed)
Arthralgia Your caregiver has diagnosed you as suffering from an arthralgia. Arthralgia means there is pain in a joint. This can come from many reasons including:  Bruising the joint which causes soreness (inflammation) in the joint.  Wear and tear on the joints which occur as we grow older (osteoarthritis).  Overusing the joint.  Various forms of arthritis.  Infections of the joint. Regardless of the cause of pain in your joint, most of these different pains respond to anti-inflammatory drugs and rest. The exception to this is when a joint is infected, and these cases are treated with antibiotics, if it is a bacterial infection. HOME CARE INSTRUCTIONS   Rest the injured area for as long as directed by your caregiver. Then slowly start using the joint as directed by your caregiver and as the pain allows. Crutches as directed may be useful if the ankles, knees or hips are involved. If the knee was splinted or casted, continue use and care as directed. If an stretchy or elastic wrapping bandage has been applied today, it should be removed and re-applied every 3 to 4 hours. It should not be applied tightly, but firmly enough to keep swelling down. Watch toes and feet for swelling, bluish discoloration, coldness, numbness or excessive pain. If any of these problems (symptoms) occur, remove the ace bandage and re-apply more loosely. If these symptoms persist, contact your caregiver or return to this location.  For the first 24 hours, keep the injured extremity elevated on pillows while lying down.  Apply ice for 15 to 20 minutes to the sore joint every couple hours while awake for the first half day. Then 3 to 4 times per day for the first 48 hours. Put the ice in a plastic bag and place a towel between the bag of ice and your skin.  Wear any splinting, casting, elastic bandage applications, or slings as instructed.  Only take over-the-counter or prescription medicines for pain, discomfort, or  fever as directed by your caregiver. Do not use aspirin immediately after the injury unless instructed by your physician. Aspirin can cause increased bleeding and bruising of the tissues.  If you were given crutches, continue to use them as instructed and do not resume weight bearing on the sore joint until instructed. Persistent pain and inability to use the sore joint as directed for more than 2 to 3 days are warning signs indicating that you should see a caregiver for a follow-up visit as soon as possible. Initially, a hairline fracture (break in bone) may not be evident on X-rays. Persistent pain and swelling indicate that further evaluation, non-weight bearing or use of the joint (use of crutches or slings as instructed), or further X-rays are indicated. X-rays may sometimes not show a small fracture until a week or 10 days later. Make a follow-up appointment with your own caregiver or one to whom we have referred you. A radiologist (specialist in reading X-rays) may read your X-rays. Make sure you know how you are to obtain your X-ray results. Do not assume everything is normal if you do not hear from us. SEEK MEDICAL CARE IF: Bruising, swelling, or pain increases. SEEK IMMEDIATE MEDICAL CARE IF:   Your fingers or toes are numb or blue.  The pain is not responding to medications and continues to stay the same or get worse.  The pain in your joint becomes severe.  You develop a fever over 102 F (38.9 C).  It becomes impossible to move or use the joint.   MAKE SURE YOU:   Understand these instructions.  Will watch your condition.  Will get help right away if you are not doing well or get worse. Document Released: 03/16/2005 Document Revised: 06/08/2011 Document Reviewed: 11/02/2007 Exit[employer] Patient Information 2013 [employer].  

## 2012-02-12 ENCOUNTER — Telehealth: Payer: Self-pay | Admitting: *Deleted

## 2012-02-12 NOTE — Telephone Encounter (Signed)
Med list updated

## 2012-02-27 ENCOUNTER — Other Ambulatory Visit: Payer: Self-pay | Admitting: Family Medicine

## 2012-03-15 ENCOUNTER — Ambulatory Visit (INDEPENDENT_AMBULATORY_CARE_PROVIDER_SITE_OTHER): Payer: BC Managed Care – PPO | Admitting: Family Medicine

## 2012-03-15 ENCOUNTER — Encounter: Payer: Self-pay | Admitting: Family Medicine

## 2012-03-15 VITALS — BP 139/76 | HR 55 | Temp 97.9°F | Resp 16 | Wt 172.0 lb

## 2012-03-15 DIAGNOSIS — M542 Cervicalgia: Secondary | ICD-10-CM

## 2012-03-15 MED ORDER — METHYLPREDNISOLONE ACETATE 80 MG/ML IJ SUSP: 80.0000 mg | Freq: Once | INTRAMUSCULAR | Status: DC

## 2012-03-15 MED ORDER — KETOROLAC TROMETHAMINE 60 MG/2ML IM SOLN: 60.0000 mg | Freq: Once | INTRAMUSCULAR | Status: DC
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
## 2012-03-15 NOTE — Patient Instructions (Signed)
Trigger Point Injection Trigger points are areas where you have muscle pain. A trigger point injection is a shot given in the trigger point to relieve that pain. A trigger point might feel like a knot in your muscle. It hurts to press on a trigger point. Sometimes the pain spreads out (radiates) to other parts of the body. For example, pressing on a trigger point in your shoulder might cause pain in your arm or neck. You might have one trigger point. Or, you might have more than one. People often have trigger points in their upper back and lower back. They also occur often in the neck and shoulders. Pain from a trigger point lasts for a long time. It can make it hard to keep moving. You might not be able to do the exercise or physical therapy that could help you deal with the pain. A trigger point injection may help. It does not work for everyone. But, it may relieve your pain for a few days or a few months. A trigger point injection does not cure long-lasting (chronic) pain. LET YOUR CAREGIVER KNOW ABOUT:  Any allergies (especially to latex, lidocaine, or steroids).  Blood-thinning medicines that you take. These drugs can lead to bleeding or bruising after an injection. They include:  Aspirin.  Ibuprofen.  Clopidogrel.  Warfarin.  Other medicines you take. This includes all vitamins, herbs, eyedrops, over-the-counter medicines, and creams.  Use of steroids.  Recent infections.  Past problems with numbing medicines.  Bleeding problems.  Surgeries you have had.  Other health problems. RISKS AND COMPLICATIONS A trigger point injection is a safe treatment. However, problems may develop, such as:  Minor side effects usually go away in 1 to 2 days. These may include:  Soreness.  Bruising.  Stiffness.  More serious problems are rare. But, they may include:  Bleeding under the skin (hematoma).  Skin infection.  Breaking off of the needle under your skin.  Lung  puncture.  The trigger point injection may not work for you. BEFORE THE PROCEDURE You may need to stop taking any medicine that thins your blood. This is to prevent bleeding and bruising. Usually these medicines are stopped several days before the injection. No other preparation is needed. PROCEDURE  A trigger point injection can be given in your caregiver's office or in a clinic. Each injection takes 2 minutes or less.  Your caregiver will feel for trigger points. The caregiver may use a marker to circle the area for the injection.  The skin over the trigger point will be washed with a germ-killing (antiseptic) solution.  The caregiver pinches the spot for the injection.  Then, a very thin needle is used for the shot. You may feel pain or a twitching feeling when the needle enters the trigger point.  A numbing solution may be injected into the trigger point. Sometimes a drug to keep down swelling, redness, and warmth (inflammation) is also injected.  Your caregiver moves the needle around the trigger zone until the tightness and twitching goes away.  After the injection, your caregiver may put gentle pressure over the injection site.  Then it is covered with a bandage. AFTER THE PROCEDURE  You can go right home after the injection.  The bandage can be taken off after a few hours.  You may feel sore and stiff for 1 to 2 days.  Go back to your regular activities slowly. Your caregiver may ask you to stretch your muscles. Do not do anything that takes   extra energy for a few days.  Follow your caregiver's instructions to manage and treat other pain. Document Released: 03/05/2011 Document Revised: 06/08/2011 Document Reviewed: 03/05/2011 [facility] Patient Information 2013 [place], [place].

## 2012-03-29 ENCOUNTER — Other Ambulatory Visit: Payer: Self-pay | Admitting: Family Medicine

## 2012-03-29 NOTE — Telephone Encounter (Signed)
Needs f/u before future refills  

## 2012-04-29 ENCOUNTER — Other Ambulatory Visit: Payer: Self-pay | Admitting: Family Medicine

## 2012-05-02 ENCOUNTER — Encounter: Payer: Self-pay | Admitting: Sports Medicine

## 2012-05-02 ENCOUNTER — Ambulatory Visit (INDEPENDENT_AMBULATORY_CARE_PROVIDER_SITE_OTHER): Payer: BC Managed Care – PPO | Admitting: Sports Medicine

## 2012-05-02 VITALS — BP 131/87 | HR 55 | Wt 171.0 lb

## 2012-05-02 DIAGNOSIS — G8929 Other chronic pain: Secondary | ICD-10-CM

## 2012-05-02 DIAGNOSIS — M542 Cervicalgia: Secondary | ICD-10-CM
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
## 2012-05-02 NOTE — Progress Notes (Signed)
Subjective:    CC: Followup  HPI: Tracy Massey is a very pleasant 62 year old female with chronic neck pain who comes to discuss a couple of issues.  Depression: Very well controlled in the past with Prozac, needing refill, this was done, and this is stable.  Chronic neck pain: Localized in 2 areas in the mid right and left cervical spine. Worse with neck extension, and present for a decade. She has been to an orthopedist in the past, has had MRIs, formal physical therapy which she does have records for, and epidural injections none of which helped. She's never had the facet joints evaluated. Pain is localized, radiates into the upper shoulders but not down into the arms, his moderate to severe. She used to see a pain management doctor, but this was too expensive and now has been seeing her previous primary care physician for chronic narcotics.  Past medical history, Surgical history, Family history not pertinant except as noted below, Social history, Allergies, and medications have been entered into the medical record, reviewed, and no changes needed.   Review of Systems: No fevers, chills, night sweats, weight loss, chest pain, or shortness of breath.   Objective:    General: Well Developed, well nourished, and in no acute distress.  Neuro: Alert and oriented x3, extra-ocular muscles intact, sensation grossly intact.  HEENT: Normocephalic, atraumatic, pupils equal round reactive to light, neck supple, no masses, no lymphadenopathy, thyroid nonpalpable.  Skin: Warm and dry, no rashes. Cardiac: Regular rate and rhythm, no murmurs rubs or gallops.  Respiratory: Clear to auscultation bilaterally. Not using accessory muscles, speaking in full sentences. Neck: Inspection unremarkable. No palpable stepoffs. Negative Spurling's maneuver. Full neck range of motion Grip strength and sensation normal in bilateral hands Strength good C4 to T1 distribution No sensory change to C4 to T1 Negative Hoffman  sign bilaterally Reflexes normal There are a few discrete areas of tenderness to palpation along the paracervical muscles.  Impression and Recommendations:

## 2012-05-10 ENCOUNTER — Telehealth: Payer: Self-pay | Admitting: *Deleted

## 2012-05-10 NOTE — Telephone Encounter (Signed)
PA obtained for MRI cervical spine w/o contrast. Auth # is [id number]. Good thru 06-09-12. Fleet Contras informed downstairs at [employer].

## 2012-05-24 ENCOUNTER — Ambulatory Visit (INDEPENDENT_AMBULATORY_CARE_PROVIDER_SITE_OTHER): Payer: BC Managed Care – PPO

## 2012-05-24 DIAGNOSIS — M542 Cervicalgia: Secondary | ICD-10-CM

## 2012-05-24 DIAGNOSIS — G8929 Other chronic pain: Secondary | ICD-10-CM

## 2012-06-01 ENCOUNTER — Ambulatory Visit (INDEPENDENT_AMBULATORY_CARE_PROVIDER_SITE_OTHER): Payer: BC Managed Care – PPO | Admitting: Sports Medicine

## 2012-06-01 DIAGNOSIS — M542 Cervicalgia: Secondary | ICD-10-CM

## 2012-06-01 DIAGNOSIS — G8929 Other chronic pain: Secondary | ICD-10-CM

## 2012-06-01 DIAGNOSIS — IMO0001 Reserved for inherently not codable concepts without codable children: Secondary | ICD-10-CM

## 2012-06-01 NOTE — Progress Notes (Signed)
  Subjective:    CC: Followup neck pain.  HPI: Tracy Massey comes back to see me, to recap she's had neck pain for years has had physical therapy, oral steroids, muscle relaxers, nerve blockers, as well as epidural injections. None of seemed to help.  Her pain has been predominantly in the lower cervical spine bilaterally and sharp and worse with extension with radiation to the upper shoulders but not down into the arms or hands. She recently had an MRI and is here for followup.  Past medical history, Surgical history, Family history not pertinant except as noted below, Social history, Allergies, and medications have been entered into the medical record, reviewed, and no changes needed.   Review of Systems: No headache, visual changes, nausea, vomiting, diarrhea, constipation, dizziness, abdominal pain, skin rash, fevers, chills, night sweats, weight loss, swollen lymph nodes, body aches, joint swelling, muscle aches, chest pain, shortness of breath, mood changes, visual or auditory hallucinations.   Objective:   General: Well Developed, well nourished, and in no acute distress.  Neuro/Psych: Alert and oriented x3, extra-ocular muscles intact, able to move all 4 extremities, sensation grossly intact. Skin: Warm and dry, no rashes noted.  Respiratory: Not using accessory muscles, speaking in full sentences, trachea midline.  Cardiovascular: Pulses palpable, no extremity edema. Abdomen: Does not appear distended. Neck: Inspection unremarkable. No palpable stepoffs. There are several discrete areas of tenderness to palpation. Negative Spurling's maneuver. Full neck range of motion, pain with extension. Grip strength and sensation normal in bilateral hands Strength good C4 to T1 distribution No sensory change to C4 to T1 Negative Hoffman sign bilaterally Reflexes normal  Procedure:  Injection of 4 trigger points along the paracervical musculature as well as trapezius. Consent obtained and  verified. Time-out conducted. Noted no overlying erythema, induration, or other signs of local infection. Skin prepped in a sterile fashion. Topical analgesic spray: Ethyl chloride. Completed without difficulty. Meds: 0.25 cc Kenalog 40, 1 cc lidocaine injected in a fanlike pattern into each painful trigger points x4. Pain immediately improved suggesting accurate placement of the medication. Advised to call if fevers/chills, erythema, induration, drainage, or persistent bleeding.  I reviewed the MRI personally, there are multilevel degenerative disc changes with moderate spinal stenosis without cervical myelopathy. She also has C7-T1 bilateral facet spondylosis, this was discussed with and confirmed with the reading radiologist. Impression and Recommendations:   This case required medical decision making of moderate complexity.

## 2012-06-01 NOTE — Assessment & Plan Note (Signed)
MRI was reviewed, there are multilevel mid cervical disc protrusions with moderate spinal stenosis. There is also bilateral C7-T1 facet spondylosis. This was discussed with the reading radiologist, and he agrees with C7-T1 facet spondylosis being the worst level.  Symptoms are lower cervical, worse with extension suggestive of facet pain. She does have several painful trigger points today, so I will inject these, and I will see her back in 4 weeks, if not sufficiently better certainly we can pursue a bilateral C7-T1 facet injection.

## 2012-07-14 ENCOUNTER — Ambulatory Visit: Payer: BC Managed Care – PPO | Admitting: Sports Medicine

## 2012-11-15 ENCOUNTER — Ambulatory Visit (INDEPENDENT_AMBULATORY_CARE_PROVIDER_SITE_OTHER): Payer: Self-pay

## 2012-11-15 ENCOUNTER — Other Ambulatory Visit: Payer: Self-pay | Admitting: Adult Health

## 2012-11-15 DIAGNOSIS — R52 Pain, unspecified: Secondary | ICD-10-CM

## 2012-11-15 DIAGNOSIS — M898X9 Other specified disorders of bone, unspecified site: Secondary | ICD-10-CM

## 2014-04-19 DIAGNOSIS — R3129 Other microscopic hematuria: Secondary | ICD-10-CM | POA: Insufficient documentation

## 2014-08-08 ENCOUNTER — Emergency Department: Payer: Worker's Compensation

## 2014-08-08 ENCOUNTER — Other Ambulatory Visit: Payer: Worker's Compensation

## 2014-08-08 ENCOUNTER — Emergency Department
Admission: EM | Admit: 2014-08-08 | Discharge: 2014-08-08 | Disposition: A | Discharge status: Home / Self Care | Payer: Worker's Compensation | Attending: Emergency Medicine | Admitting: Emergency Medicine

## 2014-08-08 ENCOUNTER — Emergency Department (HOSPITAL_COMMUNITY): Payer: Self-pay

## 2014-08-08 DIAGNOSIS — Y92410 Unspecified street and highway as the place of occurrence of the external cause: Secondary | ICD-10-CM | POA: Insufficient documentation

## 2014-08-08 DIAGNOSIS — S29012A Strain of muscle and tendon of back wall of thorax, initial encounter: Secondary | ICD-10-CM | POA: Insufficient documentation

## 2014-08-08 DIAGNOSIS — S29019A Strain of muscle and tendon of unspecified wall of thorax, initial encounter: Secondary | ICD-10-CM

## 2014-08-08 DIAGNOSIS — S161XXA Strain of muscle, fascia and tendon at neck level, initial encounter: Secondary | ICD-10-CM | POA: Diagnosis not present

## 2014-08-08 DIAGNOSIS — Z79899 Other long term (current) drug therapy: Secondary | ICD-10-CM | POA: Insufficient documentation

## 2014-08-08 DIAGNOSIS — Y9389 Activity, other specified: Secondary | ICD-10-CM | POA: Diagnosis not present

## 2014-08-08 DIAGNOSIS — Z880 Allergy status to penicillin: Secondary | ICD-10-CM | POA: Diagnosis not present

## 2014-08-08 DIAGNOSIS — I10 Essential (primary) hypertension: Secondary | ICD-10-CM | POA: Insufficient documentation

## 2014-08-08 DIAGNOSIS — R0789 Other chest pain: Secondary | ICD-10-CM

## 2014-08-08 DIAGNOSIS — Y998 Other external cause status: Secondary | ICD-10-CM | POA: Insufficient documentation

## 2014-08-08 DIAGNOSIS — S2232XA Fracture of one rib, left side, initial encounter for closed fracture: Secondary | ICD-10-CM

## 2014-08-08 DIAGNOSIS — S299XXA Unspecified injury of thorax, initial encounter: Secondary | ICD-10-CM | POA: Diagnosis present

## 2014-08-08 DIAGNOSIS — Z91040 Latex allergy status: Secondary | ICD-10-CM | POA: Insufficient documentation

## 2014-08-08 MED ORDER — OXYCODONE-ACETAMINOPHEN 5-325 MG PO TABS
ORAL_TABLET | ORAL | Status: AC
  Administered 2014-08-08: 1 via ORAL
  Filled 2014-08-08: qty 1

## 2014-08-08 MED ORDER — OXYCODONE-ACETAMINOPHEN 5-325 MG PO TABS
1.0000 | ORAL_TABLET | Freq: Once | ORAL | Status: AC
  Administered 2014-08-08: 1 via ORAL

## 2014-08-08 MED ORDER — IBUPROFEN 600 MG PO TABS
ORAL_TABLET | ORAL | Status: AC
  Administered 2014-08-08: 600 mg via ORAL
  Filled 2014-08-08: qty 1

## 2014-08-08 MED ORDER — IBUPROFEN 800 MG PO TABS: 800.0000 mg | ORAL_TABLET | Freq: Three times a day (TID) | ORAL | Status: DC | PRN

## 2014-08-08 MED ORDER — OXYCODONE-ACETAMINOPHEN 5-325 MG PO TABS: 1.0000 | ORAL_TABLET | ORAL | Status: DC | PRN

## 2014-08-08 MED ORDER — IBUPROFEN 600 MG PO TABS
600.0000 mg | ORAL_TABLET | Freq: Once | ORAL | Status: AC
  Administered 2014-08-08: 600 mg via ORAL
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
## 2014-08-08 NOTE — ED Notes (Signed)
Urine drug screen is complete and taken to lab.
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
## 2014-08-08 NOTE — ED Notes (Signed)
Patient transported to X-ray 

## 2014-08-08 NOTE — ED Notes (Signed)
Pt has left side pain and left shoudler pain.   Pt also has a headache.  Alert.  Speech clear.  Family and supervisor with pt.

## 2014-08-08 NOTE — ED Notes (Signed)
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
## 2014-08-09 ENCOUNTER — Other Ambulatory Visit: Payer: Self-pay | Admitting: Emergency Medicine

## 2014-08-09 ENCOUNTER — Ambulatory Visit: Admission: RE | Admit: 2014-08-09 | Payer: Worker's Compensation | Source: Ambulatory Visit

## 2014-08-09 ENCOUNTER — Ambulatory Visit
Admission: RE | Admit: 2014-08-09 | Discharge: 2014-08-09 | Disposition: A | Discharge status: Home / Self Care | Payer: Worker's Compensation | Source: Ambulatory Visit | Attending: Emergency Medicine | Admitting: Emergency Medicine

## 2014-08-09 DIAGNOSIS — M25512 Pain in left shoulder: Secondary | ICD-10-CM

## 2014-11-19 ENCOUNTER — Ambulatory Visit (INDEPENDENT_AMBULATORY_CARE_PROVIDER_SITE_OTHER): Payer: Worker's Compensation | Admitting: Sports Medicine

## 2014-11-19 ENCOUNTER — Encounter: Payer: Self-pay | Admitting: Sports Medicine

## 2014-11-19 VITALS — BP 140/68 | HR 86 | Ht 63.0 in

## 2014-11-19 DIAGNOSIS — IMO0001 Reserved for inherently not codable concepts without codable children: Secondary | ICD-10-CM

## 2014-11-19 DIAGNOSIS — M791 Myalgia: Secondary | ICD-10-CM | POA: Diagnosis not present

## 2014-11-19 DIAGNOSIS — G8929 Other chronic pain: Secondary | ICD-10-CM

## 2014-11-19 DIAGNOSIS — M542 Cervicalgia: Secondary | ICD-10-CM

## 2014-11-19 DIAGNOSIS — M609 Myositis, unspecified: Secondary | ICD-10-CM

## 2014-11-19 NOTE — Assessment & Plan Note (Signed)
2 year response to previous set of myofascial trigger point injections. 6 trigger point injections today, 2 into each trapezius and 1 into the bilateral paralumbar trigger points. She does have C7-T1 facet arthritis on MRI, this can be a future interventional target should pain persist.

## 2014-11-19 NOTE — Progress Notes (Signed)

## 2015-02-04 ENCOUNTER — Ambulatory Visit (INDEPENDENT_AMBULATORY_CARE_PROVIDER_SITE_OTHER): Payer: Medicare Other | Admitting: Sports Medicine

## 2015-02-04 ENCOUNTER — Encounter: Payer: Self-pay | Admitting: Sports Medicine

## 2015-02-04 ENCOUNTER — Ambulatory Visit: Payer: Worker's Compensation | Admitting: Sports Medicine

## 2015-02-04 VITALS — BP 158/62 | HR 63 | Temp 98.0°F | Resp 16 | Wt 185.0 lb

## 2015-02-04 DIAGNOSIS — M542 Cervicalgia: Secondary | ICD-10-CM | POA: Diagnosis not present

## 2015-02-04 DIAGNOSIS — G8929 Other chronic pain: Secondary | ICD-10-CM | POA: Diagnosis not present

## 2015-02-04 NOTE — Progress Notes (Signed)
  Subjective:    CC: Fibromyalgia flare  HPI: This is a pleasant 64 year old female, months ago we did sixth point injections, she has a fantastic response for a period of time and then has a recurrence, she is having a recurrence with pain in the trapezius, and bilateral paralumbar region. She desires repeat her point injections today.  Past medical history, Surgical history, Family history not pertinant except as noted below, Social history, Allergies, and medications have been entered into the medical record, reviewed, and no changes needed.   Review of Systems: No fevers, chills, night sweats, weight loss, chest pain, or shortness of breath.   Objective:    General: Well Developed, well nourished, and in no acute distress.  Neuro: Alert and oriented x3, extra-ocular muscles intact, sensation grossly intact.  HEENT: Normocephalic, atraumatic, pupils equal round reactive to light, neck supple, no masses, no lymphadenopathy, thyroid nonpalpable.  Skin: Warm and dry, no rashes. Cardiac: Regular rate and rhythm, no murmurs rubs or gallops, no lower extremity edema.  Respiratory: Clear to auscultation bilaterally. Not using accessory muscles, speaking in full sentences. Neck: Negative spurling's Full neck range of motion Grip strength and sensation normal in bilateral hands Strength good C4 to T1 distribution No sensory change to C4 to T1 Reflexes normal tender to palpation along the trapezius and bilateral paralumbar trigger points  Procedure:  Injection of 6 trigger points spread out between bilateral trapezius and paralumbar Consent obtained and verified. Time-out conducted. Noted no overlying erythema, induration, or other signs of local infection. Skin prepped in a sterile fashion. Topical analgesic spray: Ethyl chloride. Completed without difficulty. Meds: Using a total of 3 mL lidocaine, 3 mL Marcaine, 2 mL kenalog 40 I spread out the medication between the trapezius and  paralumbar regions injecting 6 total trigger points. Pain immediately improved suggesting accurate placement of the medication. Advised to call if fevers/chills, erythema, induration, drainage, or persistent bleeding.  Impression and Recommendations:

## 2015-02-04 NOTE — Assessment & Plan Note (Signed)
3 months since her last set of trigger point injections, we did 6 injections today, 2 in each trapezius and one in each paralumbar muscle. Return as needed.

## 2015-08-05 ENCOUNTER — Encounter: Payer: Self-pay | Admitting: Sports Medicine

## 2015-08-05 ENCOUNTER — Ambulatory Visit (INDEPENDENT_AMBULATORY_CARE_PROVIDER_SITE_OTHER): Payer: Medicare FFS | Admitting: Sports Medicine

## 2015-08-05 VITALS — BP 122/70 | HR 71 | Resp 16 | Wt 186.4 lb

## 2015-08-05 DIAGNOSIS — M542 Cervicalgia: Secondary | ICD-10-CM | POA: Diagnosis not present

## 2015-08-05 DIAGNOSIS — G8929 Other chronic pain: Secondary | ICD-10-CM

## 2015-08-05 NOTE — Assessment & Plan Note (Signed)
Excellent response to her last set of trigger point injections approximately 6 months ago. Today we performed 6 injections, 3 into each trapezius. Return to see me as needed.

## 2015-08-05 NOTE — Addendum Note (Signed)
Addended by: Baird KayUGLAS, Azaylia Fong M on: 08/05/2015 10:05 AM   Modules accepted: Orders, Medications

## 2015-08-05 NOTE — Progress Notes (Signed)
  Subjective:    CC: trapezial spasm  HPI: This is a pleasant 65 year old female, she responded extremely well to trigger point injections approximately 6 months ago, she is having recurrence of pain in her bilateral trapezius and desires repeat trigger point injections, pain is moderate, persistent without radiation past the upper shoulder.  Past medical history, Surgical history, Family history not pertinant except as noted below, Social history, Allergies, and medications have been entered into the medical record, reviewed, and no changes needed.   Review of Systems: No fevers, chills, night sweats, weight loss, chest pain, or shortness of breath.   Objective:    General: Well Developed, well nourished, and in no acute distress.  Neuro: Alert and oriented x3, extra-ocular muscles intact, sensation grossly intact.  HEENT: Normocephalic, atraumatic, pupils equal round reactive to light, neck supple, no masses, no lymphadenopathy, thyroid nonpalpable.  Skin: Warm and dry, no rashes. Cardiac: Regular rate and rhythm, no murmurs rubs or gallops, no lower extremity edema.  Respiratory: Clear to auscultation bilaterally. Not using accessory muscles, speaking in full sentences.  Procedure:  Injection of#6 trigger points on trapezius Consent obtained and verified. Time-out conducted. Noted no overlying erythema, induration, or other signs of local infection. Skin prepped in a sterile fashion. Topical analgesic spray: Ethyl chloride. Completed without difficulty. Meds:a total of 2 mL Kenalog 40, 4 mL lidocaine spread out between the 6 trigger points Pain immediately improved suggesting accurate placement of the medication. Advised to call if fevers/chills, erythema, induration, drainage, or persistent bleeding.  Impression and Recommendations:

## 2015-08-19 DIAGNOSIS — M5030 Other cervical disc degeneration, unspecified cervical region: Secondary | ICD-10-CM | POA: Insufficient documentation

## 2015-12-16 DIAGNOSIS — N1832 Chronic kidney disease, stage 3b: Secondary | ICD-10-CM | POA: Insufficient documentation

## 2015-12-16 DIAGNOSIS — N183 Chronic kidney disease, stage 3 (moderate): Secondary | ICD-10-CM

## 2016-01-06 ENCOUNTER — Encounter: Payer: Self-pay | Admitting: Sports Medicine

## 2016-01-06 ENCOUNTER — Ambulatory Visit (INDEPENDENT_AMBULATORY_CARE_PROVIDER_SITE_OTHER): Payer: Medicare FFS | Admitting: Sports Medicine

## 2016-01-06 DIAGNOSIS — M542 Cervicalgia: Secondary | ICD-10-CM

## 2016-01-06 DIAGNOSIS — G8929 Other chronic pain: Secondary | ICD-10-CM

## 2016-01-06 NOTE — Assessment & Plan Note (Signed)
Continues to do well with trigger point injections, the most recent of which were 5 months ago. We repeated them today, 4 over the trapezius and 1 along the right paralumbar muscle. Return as needed. She is taking an SNRI, if pain becomes more frequent or widespread we would add Lyrica.

## 2016-01-06 NOTE — Progress Notes (Signed)
  Subjective:    CC: Neck and back pain  HPI: This is a pleasant 65 year old female with cervical degenerative disc disease but also myofascial pain syndrome. She has had multiple injections, the most helpful of which were trigger point injections, we did #6 approximately 5 months ago, she had a fantastic response and desires repeat. Pain is moderate, persistent, localized on the trapezius bilaterally as well as the right paralumbar muscles.  Past medical history:  Negative.  See flowsheet/record as well for more information.  Surgical history: Negative.  See flowsheet/record as well for more information.  Family history: Negative.  See flowsheet/record as well for more information.  Social history: Negative.  See flowsheet/record as well for more information.  Allergies, and medications have been entered into the medical record, reviewed, and no changes needed.   Review of Systems: No fevers, chills, night sweats, weight loss, chest pain, or shortness of breath.   Objective:    General: Well Developed, well nourished, and in no acute distress.  Neuro: Alert and oriented x3, extra-ocular muscles intact, sensation grossly intact.  HEENT: Normocephalic, atraumatic, pupils equal round reactive to light, neck supple, no masses, no lymphadenopathy, thyroid nonpalpable.  Skin: Warm and dry, no rashes. Cardiac: Regular rate and rhythm, no murmurs rubs or gallops, no lower extremity edema.  Respiratory: Clear to auscultation bilaterally. Not using accessory muscles, speaking in full sentences.  Procedure:  Injection of #5 trigger point injections, 4 over the trapezius and one on the right paralumbar muscles Consent obtained and verified. Time-out conducted. Noted no overlying erythema, induration, or other signs of local infection. Skin prepped in a sterile fashion. Topical analgesic spray: Ethyl chloride. Completed without difficulty. Meds: A total of 2 mL kenalog 40, 10 mL lidocaine was  spread out between the multiple injection sites. Pain immediately improved suggesting accurate placement of the medication. Advised to call if fevers/chills, erythema, induration, drainage, or persistent bleeding.  Impression and Recommendations:    Chronic neck pain Continues to do well with trigger point injections, the most recent of which were 5 months ago. We repeated them today, 4 over the trapezius and 1 along the right paralumbar muscle. Return as needed. She is taking an SNRI, if pain becomes more frequent or widespread we would add Lyrica.

## 2016-04-02 ENCOUNTER — Ambulatory Visit: Payer: Medicare FFS | Admitting: Sports Medicine

## 2016-04-06 ENCOUNTER — Ambulatory Visit (INDEPENDENT_AMBULATORY_CARE_PROVIDER_SITE_OTHER): Payer: Medicare FFS | Admitting: Sports Medicine

## 2016-04-06 ENCOUNTER — Encounter: Payer: Self-pay | Admitting: Sports Medicine

## 2016-04-06 DIAGNOSIS — M542 Cervicalgia: Secondary | ICD-10-CM

## 2016-04-06 DIAGNOSIS — G8929 Other chronic pain: Secondary | ICD-10-CM

## 2016-04-06 NOTE — Progress Notes (Signed)
  Subjective:    CC: Neck and shoulder pain  HPI: This is a very pleasant 66 year old female, we have done several trigger point injections in the past, each one lasts 3-6 months. She is here for repeat trigger point injections. Currently on SNRI, tells me the gabapentin and Lyrica were ineffective in the past. Pain is moderate, persistent, localized on both paraspinal muscles of the neck as well as over her upper trapezius.  Past medical history:  Negative.  See flowsheet/record as well for more information.  Surgical history: Negative.  See flowsheet/record as well for more information.  Family history: Negative.  See flowsheet/record as well for more information.  Social history: Negative.  See flowsheet/record as well for more information.  Allergies, and medications have been entered into the medical record, reviewed, and no changes needed.   Review of Systems: No fevers, chills, night sweats, weight loss, chest pain, or shortness of breath.   Objective:    General: Well Developed, well nourished, and in no acute distress.  Neuro: Alert and oriented x3, extra-ocular muscles intact, sensation grossly intact.  HEENT: Normocephalic, atraumatic, pupils equal round reactive to light, neck supple, no masses, no lymphadenopathy, thyroid nonpalpable.  Skin: Warm and dry, no rashes. Cardiac: Regular rate and rhythm, no murmurs rubs or gallops, no lower extremity edema.  Respiratory: Clear to auscultation bilaterally. Not using accessory muscles, speaking in full sentences.  Procedure:  Injection of #6 trapezial trigger points Consent obtained and verified. Time-out conducted. Noted no overlying erythema, induration, or other signs of local infection. Skin prepped in a sterile fashion. Topical analgesic spray: Ethyl chloride. Completed without difficulty. Meds: A total of 2 mL kenalog 40, 5 mL Marcaine, 5 mL lidocaine spread out between the #6 trigger points. Pain immediately improved  suggesting accurate placement of the medication. Advised to call if fevers/chills, erythema, induration, drainage, or persistent bleeding.  Impression and Recommendations:    Chronic neck pain Continues to do well with trigger point injections. The most recent injections were 3 months ago. I repeated 6 trigger point injections, 3 into each trapezius. She is currently taking an SNRI, Lyrica was ineffective in the past.  Return as needed.

## 2016-04-06 NOTE — Assessment & Plan Note (Signed)
Continues to do well with trigger point injections. The most recent injections were 3 months ago. I repeated 6 trigger point injections, 3 into each trapezius. She is currently taking an SNRI, Lyrica was ineffective in the past.  Return as needed.
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
## 2016-07-03 ENCOUNTER — Ambulatory Visit (INDEPENDENT_AMBULATORY_CARE_PROVIDER_SITE_OTHER): Payer: Medicare FFS | Admitting: Sports Medicine

## 2016-07-03 DIAGNOSIS — M542 Cervicalgia: Secondary | ICD-10-CM

## 2016-07-03 DIAGNOSIS — G8929 Other chronic pain: Secondary | ICD-10-CM

## 2016-07-03 NOTE — Progress Notes (Signed)
  Subjective:    CC: Follow-up  HPI: Vena Austria returns, she has multiple areas of pain in her trapezius and low back, in the past we have used her point injections every 6-8 months with good response. She is here today with recurrence of pain, she does desire repeat trigger point injections. Pain is moderate, persistent, localized without radiation.  Past medical history:  Negative.  See flowsheet/record as well for more information.  Surgical history: Negative.  See flowsheet/record as well for more information.  Family history: Negative.  See flowsheet/record as well for more information.  Social history: Negative.  See flowsheet/record as well for more information.  Allergies, and medications have been entered into the medical record, reviewed, and no changes needed.   Review of Systems: No fevers, chills, night sweats, weight loss, chest pain, or shortness of breath.   Objective:    General: Well Developed, well nourished, and in no acute distress.  Neuro: Alert and oriented x3, extra-ocular muscles intact, sensation grossly intact.  HEENT: Normocephalic, atraumatic, pupils equal round reactive to light, neck supple, no masses, no lymphadenopathy, thyroid nonpalpable.  Skin: Warm and dry, no rashes. Cardiac: Regular rate and rhythm, no murmurs rubs or gallops, no lower extremity edema.  Respiratory: Clear to auscultation bilaterally. Not using accessory muscles, speaking in full sentences. Neck and back: Multiple tender trigger points over the trapezius and quadratus lumborum  Procedure:  Injection of #6 trigger points across the trapezius and quadratus lumborum Consent obtained and verified. Time-out conducted. Noted no overlying erythema, induration, or other signs of local infection. Skin prepped in a sterile fashion. Topical analgesic spray: Ethyl chloride. Completed without difficulty. Meds: Used a total of 2 mL Kenalog 40, 2 mL lidocaine, 2 mL bupivacaine spread out between  the multiple trigger points Pain immediately improved suggesting accurate placement of the medication. Advised to call if fevers/chills, erythema, induration, drainage, or persistent bleeding.  Impression and Recommendations:    Chronic neck pain #6 trigger point injections into the trapezius and quadratus lumborum. Return as needed.

## 2016-07-03 NOTE — Assessment & Plan Note (Signed)
#  6 trigger point injections into the trapezius and quadratus lumborum. Return as needed.

## 2016-09-28 ENCOUNTER — Encounter: Payer: Self-pay | Admitting: Sports Medicine

## 2016-09-28 ENCOUNTER — Ambulatory Visit (INDEPENDENT_AMBULATORY_CARE_PROVIDER_SITE_OTHER): Payer: Medicare FFS | Admitting: Sports Medicine

## 2016-09-28 DIAGNOSIS — G8929 Other chronic pain: Secondary | ICD-10-CM

## 2016-09-28 DIAGNOSIS — M542 Cervicalgia: Secondary | ICD-10-CM

## 2016-09-28 NOTE — Progress Notes (Signed)
  Subjective:    CC: Follow-up  HPI: Myofascial pain: Is been 3 months since her last 6 trigger point injections, desires repeat today. Pain is moderate, persistent, localized along the trapezius as well as the quadratus lumborum bilaterally. No radiation.  Past medical history:  Negative.  See flowsheet/record as well for more information.  Surgical history: Negative.  See flowsheet/record as well for more information.  Family history: Negative.  See flowsheet/record as well for more information.  Social history: Negative.  See flowsheet/record as well for more information.  Allergies, and medications have been entered into the medical record, reviewed, and no changes needed.   Review of Systems: No fevers, chills, night sweats, weight loss, chest pain, or shortness of breath.   Objective:    General: Well Developed, well nourished, and in no acute distress.  Neuro: Alert and oriented x3, extra-ocular muscles intact, sensation grossly intact.  HEENT: Normocephalic, atraumatic, pupils equal round reactive to light, neck supple, no masses, no lymphadenopathy, thyroid nonpalpable.  Skin: Warm and dry, no rashes. Cardiac: Regular rate and rhythm, no murmurs rubs or gallops, no lower extremity edema.  Respiratory: Clear to auscultation bilaterally. Not using accessory muscles, speaking in full sentences.  Procedure:  Injection of #6 trigger points across the trapezius and quadratus lumborum Consent obtained and verified. Time-out conducted. Noted no overlying erythema, induration, or other signs of local infection. Skin prepped in a sterile fashion. Topical analgesic spray: Ethyl chloride. Completed without difficulty. Meds: Used a total of 2 mL Kenalog 40, 2 mL lidocaine, 2 mL bupivacaine spread out between the multiple trigger points Pain immediately improved suggesting accurate placement of the medication. Advised to call if fevers/chills, erythema, induration, drainage, or persistent  bleeding.  Impression and Recommendations:    Chronic neck pain Per patient request multiple trigger point injections along the trapezius and quadratus lumborum bilaterally. 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 As long she continues to respond to trigger point injections we can avoid aggressive intervention in the form of epidurals or facet joint injections. Return as needed.

## 2016-09-28 NOTE — Assessment & Plan Note (Addendum)
Per patient request multiple trigger point injections along the trapezius and quadratus lumborum bilaterally. 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 As long she continues to respond to trigger point injections we can avoid aggressive intervention in the form of epidurals or facet joint injections. Return as needed.

## 2017-02-08 ENCOUNTER — Encounter: Payer: Self-pay | Admitting: Sports Medicine

## 2017-02-08 ENCOUNTER — Ambulatory Visit (INDEPENDENT_AMBULATORY_CARE_PROVIDER_SITE_OTHER): Payer: Medicare FFS | Admitting: Sports Medicine

## 2017-02-08 ENCOUNTER — Ambulatory Visit: Payer: Medicare FFS | Admitting: Sports Medicine

## 2017-02-08 DIAGNOSIS — G8929 Other chronic pain: Secondary | ICD-10-CM

## 2017-02-08 DIAGNOSIS — M60819 Other myositis, unspecified shoulder: Secondary | ICD-10-CM | POA: Diagnosis not present

## 2017-02-08 DIAGNOSIS — M542 Cervicalgia: Secondary | ICD-10-CM | POA: Diagnosis not present

## 2017-02-08 DIAGNOSIS — M069 Rheumatoid arthritis, unspecified: Secondary | ICD-10-CM

## 2017-02-08 NOTE — Progress Notes (Signed)
  Subjective:    CC: Neck pain  HPI: This is a pleasant 66 year old female with multiple cervical and lumbar degenerative changes, overall she is done well with multiple trigger point injections 3-4 times per year, the most recent was back in July.  She is here requesting repeat trigger point injections along the paracervical, trapezial and quadratus lumborum muscles.  No bowel or bladder dysfunction, saddle numbness, constitutional symptoms.  Past medical history:  Negative.  See flowsheet/record as well for more information.  Surgical history: Negative.  See flowsheet/record as well for more information.  Family history: Negative.  See flowsheet/record as well for more information.  Social history: Negative.  See flowsheet/record as well for more information.  Allergies, and medications have been entered into the medical record, reviewed, and no changes needed.   Review of Systems: No fevers, chills, night sweats, weight loss, chest pain, or shortness of breath.   Objective:    General: Well Developed, well nourished, and in no acute distress.  Neuro: Alert and oriented x3, extra-ocular muscles intact, sensation grossly intact.  HEENT: Normocephalic, atraumatic, pupils equal round reactive to light, neck supple, no masses, no lymphadenopathy, thyroid nonpalpable.  Skin: Warm and dry, no rashes. Cardiac: Regular rate and rhythm, no murmurs rubs or gallops, no lower extremity edema.  Respiratory: Clear to auscultation bilaterally. Not using accessory muscles, speaking in full sentences.  Procedure: Injection of#6 trigger points across the trapezius and quadratus lumborum Consent obtained and verified. Time-out conducted. Noted no overlying erythema, induration, or other signs of local infection. Skin prepped in a sterile fashion. Topical analgesic spray: Ethyl chloride. Completed without difficulty. Meds:Used a total of 2 mL Kenalog 40, 2 mL lidocaine, 2 mL bupivacaine spread out  between the multiple trigger points Pain immediately improved suggesting accurate placement of the medication. Advised to call if fevers/chills, erythema, induration, drainage, or persistent bleeding.  Impression and Recommendations:    Chronic neck pain Repeat bilateral trapezial and quadratus lumborum trigger point injections x6. She does have multilevel facet arthritis worse at the C7-T1 levels, and multilevel degenerative disc disease. As before if she continues to have 1929-month response to trigger point injections we do not have to proceed with any form of more aggressive intervention such as epidurals or facet joint injections, return as needed.  Patient will follow up with her PCP regarding elevated blood pressure, no red flags.  ___________________________________________ Ihor Austinhomas J. Benjamin Stainhekkekandam, M.D., ABFM., CAQSM. Primary Care and Sports Medicine [facility]  Adjunct Instructor of Family Medicine  [employer]

## 2017-02-08 NOTE — Assessment & Plan Note (Signed)
Repeat bilateral trapezial and quadratus lumborum trigger point injections x6. She does have multilevel facet arthritis worse at the C7-T1 levels, and multilevel degenerative disc disease. As before if she continues to have 2562-month response to trigger point injections we do not have to proceed with any form of more aggressive intervention such as epidurals or facet joint injections, return as needed.

## 2017-04-20 LAB — HM MAMMOGRAPHY

## 2017-05-14 ENCOUNTER — Encounter: Payer: Self-pay | Admitting: Sports Medicine

## 2017-05-14 ENCOUNTER — Ambulatory Visit: Payer: Medicare FFS | Admitting: Sports Medicine

## 2017-05-14 DIAGNOSIS — M542 Cervicalgia: Secondary | ICD-10-CM | POA: Diagnosis not present

## 2017-05-14 DIAGNOSIS — L237 Allergic contact dermatitis due to plants, except food: Secondary | ICD-10-CM | POA: Insufficient documentation

## 2017-05-14 DIAGNOSIS — G8929 Other chronic pain: Secondary | ICD-10-CM

## 2017-05-14 NOTE — Assessment & Plan Note (Signed)
After working in the yard recently, linear erythematous rash on the legs. The injections that we placed in her trapezius and quadratus lumborum will also serve to treat her poison ivy dermatitis.

## 2017-05-14 NOTE — Assessment & Plan Note (Signed)
Repeat bilateral trapezial and quadratus lumborum trigger point injections x8. There is baseline multilevel facet arthritis worse at the C7-T1 levels and multilevel cervical degenerative disc disease. We will continue q. 3 to every 4 month trigger point injections, return as needed.

## 2017-05-14 NOTE — Progress Notes (Signed)
Subjective:    CC: Neck and back pain  HPI: This is a pleasant 67 year old female with cervical and lumbar spondylosis, she has fibromyalgia as well, she has been responding extremely well to multiple trigger point injections into the trapezius and quadratus lumborum, the most recent of which was over 3 months ago.  This ultimately keeps her from needing more aggressive intervention.  She is back with recurrence of pain, moderate, persistent, in the above locations without radiation.  No constitutional symptoms no trauma.  In addition she was working in the yard, came in contact with some poison ivy and now is itching all over her legs.  I reviewed the past medical history, family history, social history, surgical history, and allergies today and no changes were needed.  Please see the problem list section below in epic for further details.  Past Medical History: Past Medical History:  Diagnosis Date  . Asthma   . Diverticulosis   . Hyperlipidemia   . Hypertension   . Menopausal syndrome   . Neck pain, chronic    Past Surgical History: Past Surgical History:  Procedure Laterality Date  . ABDOMINAL HYSTERECTOMY  1986   TAH w/o BSO/endometriosis  . APPENDECTOMY  1983  . CHOLECYSTECTOMY  1995   lap  . HAND SURGERY    . HEEL SPUR SURGERY  1996   LT foot  . KNEE ARTHROSCOPY  1983   RT knee and LT knee 1986, 1990  . TUBAL LIGATION     bilateral   Social History: Social History   Socioeconomic History  . Marital status: Married    Spouse name: None  . Number of children: None  . Years of education: None  . Highest education level: None  Social Needs  . Financial resource strain: None  . Food insecurity - worry: None  . Food insecurity - inability: None  . Transportation needs - medical: None  . Transportation needs - non-medical: None  Occupational History  . None  Tobacco Use  . Smoking status: Never Smoker  . Smokeless tobacco: Never Used  Substance and Sexual  Activity  . Alcohol use: Yes    Comment: rare  . Drug use: No  . 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  Other Topics Concern  . None  Social History Narrative  . None   Family History: Family History  Problem Relation Age of Onset  . Hypertension Mother   . Clotting disorder Mother   . Cancer Father 31       throat/died  . Hypertension Daughter   . Emphysema Paternal Grandfather   . Hypertension Brother    Allergies: Allergies  Allergen Reactions  . Iodinated Diagnostic Agents Anaphylaxis  . Latex Rash  . Penicillins Rash   Medications: See med rec.  Review of Systems: No fevers, chills, night sweats, weight loss, chest pain, or shortness of breath.   Objective:    General: Well Developed, well nourished, and in no acute distress.  Neuro: Alert and oriented x3, extra-ocular muscles intact, sensation grossly intact.  HEENT: Normocephalic, atraumatic, pupils equal round reactive to light, neck supple, no masses, no lymphadenopathy, thyroid nonpalpable.  Skin: Warm and dry, she does have a classic linear erythematous papulopustular rash consistent with poison ivy dermatitis on both legs.  No excoriation and no sign of bacterial superinfection. Cardiac: Regular rate and rhythm, no murmurs rubs or gallops, no lower extremity edema.  Respiratory: Clear to auscultation bilaterally. Not using accessory muscles, speaking in full sentences. Neck: Negative spurling's Full neck range of motion Grip strength and sensation normal in bilateral hands Strength good C4 to T1 distribution No sensory change to C4 to T1 Reflexes normal Multiple tender trigger points across the trapezius as well as the quadratus lumborum.  Procedure:  Injection of #8 trigger points across the trapezius and quadratus lumborum Consent obtained and verified. Time-out conducted. Noted no overlying erythema,  induration, or other signs of local infection. Skin prepped in a sterile fashion. Topical analgesic spray: Ethyl chloride. Completed without difficulty. Meds: A total of 2 cc kenalog 40 and 6 mL lidocaine 1% without epinephrine were spread out between the 8 trigger points. Pain immediately improved suggesting accurate placement of the medication. Advised to call if fevers/chills, erythema, induration, drainage, or persistent bleeding.  Impression and Recommendations:    Chronic neck pain Repeat bilateral trapezial and quadratus lumborum trigger point injections x8. There is baseline multilevel facet arthritis worse at the C7-T1 levels and multilevel cervical degenerative disc disease. We will continue q. 3 to every 4 month trigger point injections, return as needed.  Poison ivy dermatitis After working in the yard recently, linear erythematous rash on the legs. The injections that we placed in her trapezius and quadratus lumborum will also serve to treat her poison ivy dermatitis.  ___________________________________________ Ihor Austinhomas J. Benjamin Stainhekkekandam, M.D., ABFM., CAQSM. Primary Care and Sports Medicine [facility]  Adjunct Instructor of Family Medicine  [employer]

## 2017-08-06 ENCOUNTER — Encounter: Payer: Self-pay | Admitting: Sports Medicine

## 2017-08-06 ENCOUNTER — Ambulatory Visit: Payer: Medicare FFS | Admitting: Sports Medicine

## 2017-08-06 DIAGNOSIS — G8929 Other chronic pain: Secondary | ICD-10-CM | POA: Diagnosis not present

## 2017-08-06 DIAGNOSIS — M542 Cervicalgia: Secondary | ICD-10-CM

## 2017-08-06 NOTE — Assessment & Plan Note (Signed)
Repeat bilateral trapezial and quadratus lumborum trigger point injections x8. Again, she has baseline multilevel facet arthritis worst at the C7-T1 levels and multilevel cervical degenerative disc disease. Return as needed.

## 2017-08-06 NOTE — Progress Notes (Signed)
Subjective:    CC: Neck and back pain  HPI: This is a pleasant 67 year old female, she does have cervical spondylosis, facet arthritis, but likely also myofascial type pain syndrome, she has done well with a series of trigger point injections 2-3 times a year.  Now having a recurrence of pain, moderate, persistent, localized over the trapezius and quadratus lumborum bilaterally, no radicular symptoms, bowel or bladder dysfunction, saddle numbness, constitutional symptoms, progressive weakness or radicular pain.  Desires repeat trigger point injections.  I reviewed the past medical history, family history, social history, surgical history, and allergies today and no changes were needed.  Please see the problem list section below in epic for further details.  Past Medical History: Past Medical History:  Diagnosis Date  . Asthma   . Diverticulosis   . Hyperlipidemia   . Hypertension   . Menopausal syndrome   . Neck pain, chronic    Past Surgical History: Past Surgical History:  Procedure Laterality Date  . ABDOMINAL HYSTERECTOMY  1986   TAH w/o BSO/endometriosis  . APPENDECTOMY  1983  . CHOLECYSTECTOMY  1995   lap  . HAND SURGERY    . HEEL SPUR SURGERY  1996   LT foot  . KNEE ARTHROSCOPY  1983   RT knee and LT knee 1986, 1990  . TUBAL LIGATION     bilateral   Social History: Social History   Socioeconomic History  . Marital status: Married    Spouse name: Not on file  . Number of children: Not on file  . Years of education: Not on file  . Highest education level: Not on file  Occupational History  . Not on file  Social Needs  . Financial resource strain: Not on file  . Food insecurity:    Worry: Not on file    Inability: Not on file  . Transportation needs:    Medical: Not on file    Non-medical: Not on file  Tobacco Use  . Smoking status: Never Smoker  . Smokeless tobacco: Never Used  Substance and Sexual Activity  . Alcohol use: Yes    Comment: rare  .  Drug use: No  . 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  Lifestyle  . Physical activity:    Days per week: Not on file    Minutes per session: Not on file  . Stress: Not on file  Relationships  . Social connections:    Talks on phone: Not on file    Gets together: Not on file    Attends religious service: Not on file    Active member of club or organization: Not on file    Attends meetings of clubs or organizations: Not on file    Relationship status: Not on file  Other Topics Concern  . Not on file  Social History Narrative  . Not on file   Family History: Family History  Problem Relation Age of Onset  . Hypertension Mother   . Clotting disorder Mother   . Cancer Father 20       throat/died  . Hypertension Daughter   . Emphysema Paternal Grandfather   . Hypertension Brother    Allergies: Allergies  Allergen Reactions  . Iodinated Diagnostic Agents Anaphylaxis  . Latex Rash  . Penicillins Rash   Medications: See med rec.  Review of Systems: No fevers, chills, night sweats, weight loss, chest pain, or shortness  of breath.   Objective:    General: Well Developed, well nourished, and in no acute distress.  Neuro: Alert and oriented x3, extra-ocular muscles intact, sensation grossly intact.  HEENT: Normocephalic, atraumatic, pupils equal round reactive to light, neck supple, no masses, no lymphadenopathy, thyroid nonpalpable.  Skin: Warm and dry, no rashes. Cardiac: Regular rate and rhythm, no murmurs rubs or gallops, no lower extremity edema.  Respiratory: Clear to auscultation bilaterally. Not using accessory muscles, speaking in full sentences.  Procedure:  Injection of #8 trigger point injections across the bilateral trapezius and quadratus lumborum Consent obtained and verified. Time-out conducted. Noted no overlying erythema, induration, or other signs of local  infection. Skin prepped in a sterile fashion. Topical analgesic spray: Ethyl chloride. Completed without difficulty. Meds: A total of 2 cc kenalog 40, 4 cc lidocaine, 4 cc bupivacaine spread out between the 8 trigger points Pain immediately improved suggesting accurate placement of the medication. Advised to call if fevers/chills, erythema, induration, drainage, or persistent bleeding.  Impression and Recommendations:    Chronic neck pain Repeat bilateral trapezial and quadratus lumborum trigger point injections x8. Again, she has baseline multilevel facet arthritis worst at the C7-T1 levels and multilevel cervical degenerative disc disease. Return as needed. ___________________________________________ Ihor Austin. Benjamin Stain, M.D., ABFM., CAQSM. Primary Care and Sports Medicine [facility]  Adjunct Instructor of Family Medicine  [employer]

## 2017-10-22 ENCOUNTER — Ambulatory Visit: Payer: Medicare FFS | Admitting: Sports Medicine

## 2017-10-22 DIAGNOSIS — G8929 Other chronic pain: Secondary | ICD-10-CM | POA: Diagnosis not present

## 2017-10-22 DIAGNOSIS — M542 Cervicalgia: Secondary | ICD-10-CM

## 2017-10-22 NOTE — Assessment & Plan Note (Signed)
Repeat bilateral trapezial, paracervical injections into trigger points today. Is only been 2-1/2 months so we used half the typical dose. She does have multilevel baseline cervical facet arthritis worse at C7-T1 and multilevel degenerative disc disease that can service future interventional targets should the above injections not provide good relief. Return no sooner than 3 months, if pain returns within 3 months we need to proceed with intervention into the facets and epidurals.

## 2017-10-22 NOTE — Progress Notes (Signed)
Subjective:    CC: Neck pain  HPI: Multilevel cervical degenerative changes, facet arthritis and protruding disks, previous multilevel trigger point injections were about 2-1/2 months ago, May 10, recurrence of pain.  Localized to the trapezius, periscapular region.  Severe, persistent.  I reviewed the past medical history, family history, social history, surgical history, and allergies today and no changes were needed.  Please see the problem list section below in epic for further details.  Past Medical History: Past Medical History:  Diagnosis Date  . Asthma   . Diverticulosis   . Hyperlipidemia   . Hypertension   . Menopausal syndrome   . Neck pain, chronic    Past Surgical History: Past Surgical History:  Procedure Laterality Date  . ABDOMINAL HYSTERECTOMY  1986   TAH w/o BSO/endometriosis  . APPENDECTOMY  1983  . CHOLECYSTECTOMY  1995   lap  . HAND SURGERY    . HEEL SPUR SURGERY  1996   LT foot  . KNEE ARTHROSCOPY  1983   RT knee and LT knee 1986, 1990  . TUBAL LIGATION     bilateral   Social History: Social History   Socioeconomic History  . Marital status: Married    Spouse name: Not on file  . Number of children: Not on file  . Years of education: Not on file  . Highest education level: Not on file  Occupational History  . Not on file  Social Needs  . Financial resource strain: Not on file  . Food insecurity:    Worry: Not on file    Inability: Not on file  . Transportation needs:    Medical: Not on file    Non-medical: Not on file  Tobacco Use  . Smoking status: Never Smoker  . Smokeless tobacco: Never Used  Substance and Sexual Activity  . Alcohol use: Yes    Comment: rare  . Drug use: No  . 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  Lifestyle  . Physical activity:    Days per week: Not on file    Minutes per session: Not on file  . Stress: Not  on file  Relationships  . Social connections:    Talks on phone: Not on file    Gets together: Not on file    Attends religious service: Not on file    Active member of club or organization: Not on file    Attends meetings of clubs or organizations: Not on file    Relationship status: Not on file  Other Topics Concern  . Not on file  Social History Narrative  . Not on file   Family History: Family History  Problem Relation Age of Onset  . Hypertension Mother   . Clotting disorder Mother   . Cancer Father [id number]       throat/died  . Hypertension Daughter   . Emphysema Paternal Grandfather   . Hypertension Brother    Allergies: Allergies  Allergen Reactions  . Iodinated Diagnostic Agents Anaphylaxis  . Latex Rash  . Penicillins Rash   Medications: See med rec.  Review of Systems: No fevers, chills, night sweats, weight loss, chest pain, or shortness of breath.   Objective:    General: Well Developed, well nourished, and in no acute distress.  Neuro: Alert and oriented x3, extra-ocular muscles intact, sensation grossly intact.  HEENT: Normocephalic, atraumatic, pupils equal round reactive to light, neck supple,  no masses, no lymphadenopathy, thyroid nonpalpable.  Skin: Warm and dry, no rashes. Cardiac: Regular rate and rhythm, no murmurs rubs or gallops, no lower extremity edema.  Respiratory: Clear to auscultation bilaterally. Not using accessory muscles, speaking in full sentences. Neck: Negative spurling's Full neck range of motion Grip strength and sensation normal in bilateral hands Strength good C4 to T1 distribution No sensory change to C4 to T1 Reflexes normal Multiple painful trigger points over the trapezius, levator scapulae, rhomboideus major and minor.  Procedure:  Injection of #3 painful trigger points across the left trapezius, levator scapulae, as well as the rhomboideus major and minor. Consent obtained and verified. Time-out conducted. Noted no  overlying erythema, induration, or other signs of local infection. Skin prepped in a sterile fashion. Topical analgesic spray: Ethyl chloride. Completed without difficulty. Meds: 1/2 cc kenalog 40, 2 half cc lidocaine, 3 cc bupivacaine spread out between the trigger points on the left side. Pain immediately improved suggesting accurate placement of the medication. Advised to call if fevers/chills, erythema, induration, drainage, or persistent bleeding.  Procedure:  Injection of #3 painful trigger points across the right trapezius, levator scapulae, as well as the rhomboideus major and minor. Consent obtained and verified. Time-out conducted. Noted no overlying erythema, induration, or other signs of local infection. Skin prepped in a sterile fashion. Topical analgesic spray: Ethyl chloride. Completed without difficulty. Meds: 1/2 cc kenalog 40, 2 half cc lidocaine, 3 cc bupivacaine spread out between the trigger points on the right side. Pain immediately improved suggesting accurate placement of the medication. Advised to call if fevers/chills, erythema, induration, drainage, or persistent bleeding.  Impression and Recommendations:    Chronic neck pain Repeat bilateral trapezial, paracervical injections into trigger points today. Is only been 2-1/2 months so we used half the typical dose. She does have multilevel baseline cervical facet arthritis worse at C7-T1 and multilevel degenerative disc disease that can service future interventional targets should the above injections not provide good relief. Return no sooner than 3 months, if pain returns within 3 months we need to proceed with intervention into the facets and epidurals. ___________________________________________ Ihor Austin. Benjamin Stain, M.D., ABFM., CAQSM. Primary Care and Sports Medicine [facility]  Adjunct Instructor of Family Medicine  [employer]

## 2017-11-24 DIAGNOSIS — R230 Cyanosis: Secondary | ICD-10-CM | POA: Insufficient documentation

## 2017-11-24 DIAGNOSIS — I749 Embolism and thrombosis of unspecified artery: Secondary | ICD-10-CM | POA: Insufficient documentation

## 2017-11-24 DIAGNOSIS — Z91041 Radiographic dye allergy status: Secondary | ICD-10-CM | POA: Insufficient documentation

## 2017-12-08 DIAGNOSIS — I739 Peripheral vascular disease, unspecified: Secondary | ICD-10-CM | POA: Insufficient documentation

## 2018-01-21 ENCOUNTER — Encounter: Payer: Self-pay | Admitting: Sports Medicine

## 2018-01-21 ENCOUNTER — Ambulatory Visit: Payer: Medicare FFS | Admitting: Sports Medicine

## 2018-01-21 DIAGNOSIS — M542 Cervicalgia: Secondary | ICD-10-CM | POA: Diagnosis not present

## 2018-01-21 DIAGNOSIS — G8929 Other chronic pain: Secondary | ICD-10-CM

## 2018-01-21 NOTE — Progress Notes (Signed)
Subjective:    CC: Neck pain  HPI: Tracy Massey returns, she is a pleasant 67 year old female with multilevel cervical degenerative disc disease, facet arthritis.  She does well with multilevel trigger point injections approximately every 3 months.  These are moderate, worsening, localized in the bilateral paracervical as well as trapezius muscles, rhomboids as well as the quadratus lumborum bilaterally.  She did recently have an ankle fracture, she does understand that the trigger point injections may slightly delay her bone healing and does desire to proceed.  I reviewed the past medical history, family history, social history, surgical history, and allergies today and no changes were needed.  Please see the problem list section below in epic for further details.  Past Medical History: Past Medical History:  Diagnosis Date  . Asthma   . Diverticulosis   . Hyperlipidemia   . Hypertension   . Menopausal syndrome   . Neck pain, chronic    Past Surgical History: Past Surgical History:  Procedure Laterality Date  . ABDOMINAL HYSTERECTOMY  1986   TAH w/o BSO/endometriosis  . APPENDECTOMY  1983  . CHOLECYSTECTOMY  1995   lap  . HAND SURGERY    . HEEL SPUR SURGERY  1996   LT foot  . KNEE ARTHROSCOPY  1983   RT knee and LT knee 1986, 1990  . TUBAL LIGATION     bilateral   Social History: Social History   Socioeconomic History  . Marital status: Married    Spouse name: Not on file  . Number of children: Not on file  . Years of education: Not on file  . Highest education level: Not on file  Occupational History  . Not on file  Social Needs  . Financial resource strain: Not on file  . Food insecurity:    Worry: Not on file    Inability: Not on file  . Transportation needs:    Medical: Not on file    Non-medical: Not on file  Tobacco Use  . Smoking status: Never Smoker  . Smokeless tobacco: Never Used  Substance and Sexual Activity  . Alcohol use: Yes    Comment: rare    . Drug use: No  . 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  Lifestyle  . Physical activity:    Days per week: Not on file    Minutes per session: Not on file  . Stress: Not on file  Relationships  . Social connections:    Talks on phone: Not on file    Gets together: Not on file    Attends religious service: Not on file    Active member of club or organization: Not on file    Attends meetings of clubs or organizations: Not on file    Relationship status: Not on file  Other Topics Concern  . Not on file  Social History Narrative  . Not on file   Family History: Family History  Problem Relation Age of Onset  . Hypertension Mother   . Clotting disorder Mother   . Cancer Father 36       throat/died  . Hypertension Daughter   . Emphysema Paternal Grandfather   . Hypertension Brother    Allergies: Allergies  Allergen Reactions  . Iodinated Diagnostic Agents Anaphylaxis  . Latex Rash  . Penicillins Rash   Medications: See med rec.  Review of Systems: No fevers, chills, night sweats, weight loss,  chest pain, or shortness of breath.   Objective:    General: Well Developed, well nourished, and in no acute distress.  Neuro: Alert and oriented x3, extra-ocular muscles intact, sensation grossly intact.  HEENT: Normocephalic, atraumatic, pupils equal round reactive to light, neck supple, no masses, no lymphadenopathy, thyroid nonpalpable.  Skin: Warm and dry, no rashes. Cardiac: Regular rate and rhythm, no murmurs rubs or gallops, no lower extremity edema.  Respiratory: Clear to auscultation bilaterally. Not using accessory muscles, speaking in full sentences.  Procedure:  Injection of #3 painful trigger points across the left paracervical muscles, trapezius, as well as quadratus lumborum. Consent obtained and verified. Time-out conducted. Noted no overlying erythema, induration,  or other signs of local infection. Skin prepped in a sterile fashion. Topical analgesic spray: Ethyl chloride. Completed without difficulty. Meds: 1 cc kenalog 40, 2 cc lidocaine, 2 cc bupivacaine spread out between the trigger points Pain immediately improved suggesting accurate placement of the medication. Advised to call if fevers/chills, erythema, induration, drainage, or persistent bleeding.  Procedure:  Injection of #3 painful trigger points across the right paracervical muscles, trapezius, as well as quadratus lumborum. Consent obtained and verified. Time-out conducted. Noted no overlying erythema, induration, or other signs of local infection. Skin prepped in a sterile fashion. Topical analgesic spray: Ethyl chloride. Completed without difficulty. Meds: 1 cc kenalog 40, 2 cc lidocaine, 2 cc bupivacaine spread out between the trigger points Pain immediately improved suggesting accurate placement of the medication. Advised to call if fevers/chills, erythema, induration, drainage, or persistent bleeding.  Impression and Recommendations:    Chronic neck pain Repeat bilateral paracervical, trapezius, quadratus lumborum trigger point injections today. Return as needed. As before she does have multilevel baseline cervical facet arthritis worse at C7-T1 with multilevel cervical degenerative disc disease, these can all service future interventional targets should the above injections exhibit waning efficacy. ___________________________________________ Ihor Austin. Benjamin Stain, M.D., ABFM., CAQSM. Primary Care and Sports Medicine  [facility]  Adjunct Professor of Family Medicine  [employer]

## 2018-01-21 NOTE — Assessment & Plan Note (Signed)
Repeat bilateral paracervical, trapezius, quadratus lumborum trigger point injections today. Return as needed. As before she does have multilevel baseline cervical facet arthritis worse at C7-T1 with multilevel cervical degenerative disc disease, these can all service future interventional targets should the above injections exhibit waning efficacy.

## 2018-04-12 ENCOUNTER — Ambulatory Visit (INDEPENDENT_AMBULATORY_CARE_PROVIDER_SITE_OTHER): Payer: Medicare FFS | Admitting: Family Medicine

## 2018-04-12 ENCOUNTER — Encounter: Payer: Self-pay | Admitting: Family Medicine

## 2018-04-12 VITALS — BP 122/69 | HR 69 | Ht 63.0 in | Wt 180.0 lb

## 2018-04-12 DIAGNOSIS — M542 Cervicalgia: Secondary | ICD-10-CM | POA: Diagnosis not present

## 2018-04-12 DIAGNOSIS — M5030 Other cervical disc degeneration, unspecified cervical region: Secondary | ICD-10-CM | POA: Diagnosis not present

## 2018-04-12 DIAGNOSIS — I10 Essential (primary) hypertension: Secondary | ICD-10-CM | POA: Diagnosis not present

## 2018-04-12 DIAGNOSIS — G8929 Other chronic pain: Secondary | ICD-10-CM | POA: Insufficient documentation

## 2018-04-12 DIAGNOSIS — F5101 Primary insomnia: Secondary | ICD-10-CM | POA: Insufficient documentation

## 2018-04-12 DIAGNOSIS — F324 Major depressive disorder, single episode, in partial remission: Secondary | ICD-10-CM

## 2018-04-12 NOTE — Assessment & Plan Note (Signed)
Will be due for refill on her medication February 3.  Pain contract updated today.  She is currently taking medication for chronic neck and low back pain secondary to degenerative disc disease and spinal stenosis.
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
## 2018-04-12 NOTE — Assessment & Plan Note (Signed)
Stable on her combination of Effexor and Wellbutrin.  She has been on the Effexor for about 2 years and the Wellbutrin for about 1 year.

## 2018-04-12 NOTE — Assessment & Plan Note (Signed)
Controlled.  Continue current regimen of amlodipine.

## 2018-04-14 ENCOUNTER — Encounter: Payer: Self-pay | Admitting: Family Medicine

## 2018-04-16 LAB — PAIN MGMT, PROFILE 6 CONF W/O MM, U
6 Acetylmorphine: NEGATIVE ng/mL (ref <10)
ALCOHOL METABOLITES: NEGATIVE ng/mL (ref <500)
AMPHETAMINES: NEGATIVE ng/mL (ref <500)
BARBITURATES: NEGATIVE ng/mL (ref <300)
Benzodiazepines: NEGATIVE ng/mL (ref <100)
Cocaine Metabolite: NEGATIVE ng/mL (ref <150)
Codeine: NEGATIVE ng/mL (ref <50)
Creatinine: 225.8 mg/dL
HYDROCODONE: 4526 ng/mL — AB (ref <50)
Hydromorphone: 217 ng/mL — ABNORMAL HIGH (ref <50)
MARIJUANA METABOLITE: NEGATIVE ng/mL (ref <20)
Methadone Metabolite: NEGATIVE ng/mL (ref <100)
Morphine: NEGATIVE ng/mL (ref <50)
NORHYDROCODONE: 6817 ng/mL — AB (ref <50)
Opiates: POSITIVE ng/mL — AB (ref <100)
Oxidant: NEGATIVE ug/mL (ref <200)
Oxycodone: NEGATIVE ng/mL (ref <100)
Phencyclidine: NEGATIVE ng/mL (ref <25)
pH: 5.47 (ref 4.5–9.0)

## 2018-04-25 ENCOUNTER — Ambulatory Visit (INDEPENDENT_AMBULATORY_CARE_PROVIDER_SITE_OTHER): Payer: Medicare FFS | Admitting: Sports Medicine

## 2018-04-25 ENCOUNTER — Encounter: Payer: Self-pay | Admitting: Sports Medicine

## 2018-04-25 DIAGNOSIS — M542 Cervicalgia: Secondary | ICD-10-CM | POA: Diagnosis not present

## 2018-04-25 DIAGNOSIS — G8929 Other chronic pain: Secondary | ICD-10-CM

## 2018-04-25 NOTE — Progress Notes (Signed)
Subjective:    CC: Recurrent neck pain  HPI: This is a pleasant 68 year old female, she has multifactorial cervical spondylosis, she has done extremely well with every 3 month trigger point injections.  The most recent of which were about 3 months ago into the trapezius, quadratus lumborum and paracervical musculature.  Today she is having recurrence of symptoms, moderate, worsening, localized in the trapezius, quadratus lumborum and paralumbar's without radiation.  No progressive weakness, no constitutional symptoms.  I reviewed the past medical history, family history, social history, surgical history, and allergies today and no changes were needed.  Please see the problem list section below in epic for further details.  Past Medical History: Past Medical History:  Diagnosis Date  . Asthma   . Diverticulosis   . Hyperlipidemia   . Hypertension   . Menopausal syndrome   . Neck pain, chronic    Past Surgical History: Past Surgical History:  Procedure Laterality Date  . ABDOMINAL HYSTERECTOMY  1986   TAH w/o BSO/endometriosis  . APPENDECTOMY  1983  . CHOLECYSTECTOMY  1995   lap  . HAND SURGERY    . HEEL SPUR SURGERY  1996   LT foot  . KNEE ARTHROSCOPY  1983   RT knee and LT knee 1986, 1990  . TUBAL LIGATION     bilateral   Social History: Social History   Socioeconomic History  . Marital status: Married    Spouse name: Not on file  . Number of children: Not on file  . Years of education: Not on file  . Highest education level: Not on file  Occupational History  . Not on file  Social Needs  . Financial resource strain: Not on file  . Food insecurity:    Worry: Not on file    Inability: Not on file  . Transportation needs:    Medical: Not on file    Non-medical: Not on file  Tobacco Use  . Smoking status: Never Smoker  . Smokeless tobacco: Never Used  Substance and Sexual Activity  . Alcohol use: Yes    Comment: rare  . Drug use: No  . 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  Lifestyle  . Physical activity:    Days per week: Not on file    Minutes per session: Not on file  . Stress: Not on file  Relationships  . Social connections:    Talks on phone: Not on file    Gets together: Not on file    Attends religious service: Not on file    Active member of club or organization: Not on file    Attends meetings of clubs or organizations: Not on file    Relationship status: Not on file  Other Topics Concern  . Not on file  Social History Narrative  . Not on file   Family History: Family History  Problem Relation Age of Onset  . Hypertension Mother   . Clotting disorder Mother   . Cancer Father 35       throat/died  . Hypertension Daughter   . Emphysema Paternal Grandfather   . Hypertension Brother    Allergies: Allergies  Allergen Reactions  . Iodinated Diagnostic Agents Anaphylaxis  . Other Anaphylaxis    Prescoline  . Latex Rash  . Penicillins Rash   Medications: See med rec.  Review of Systems: No fevers, chills, night sweats, weight loss, chest pain,  or shortness of breath.   Objective:    General: Well Developed, well nourished, and in no acute distress.  Neuro: Alert and oriented x3, extra-ocular muscles intact, sensation grossly intact.  HEENT: Normocephalic, atraumatic, pupils equal round reactive to light, neck supple, no masses, no lymphadenopathy, thyroid nonpalpable.  Skin: Warm and dry, no rashes. Cardiac: Regular rate and rhythm, no murmurs rubs or gallops, no lower extremity edema.  Respiratory: Clear to auscultation bilaterally. Not using accessory muscles, speaking in full sentences. Musculoskeletal: Multiple diffuse trigger points over the paracervical, trapezial, paralumbar and quadratus lumborum muscles.  Procedure:  Injection of #6 trigger points, bilateral trapezial and quadratus lumborum. Consent  obtained and verified. Time-out conducted. Noted no overlying erythema, induration, or other signs of local infection. Skin prepped in a sterile fashion. Topical analgesic spray: Ethyl chloride. Completed without difficulty. Meds: A total of 2cc Kenalog 40, 4 cc lidocaine, 4 cc bupivacaine spread out in a fanlike pattern with a 25-gauge needle between the 6 trigger points.   Pain immediately improved suggesting accurate placement of the medication. Advised to call if fevers/chills, erythema, induration, drainage, or persistent bleeding.  Impression and Recommendations:    Chronic neck pain Repeat bilateral paracervical, trapezius, quadratus lumborum injections. As before she has baseline cervical facet arthritis worse at C7-T1 with multilevel cervical DDD which can all serve as future interventional targets if the above trigger points exhibit waning efficacy. ___________________________________________ Ihor Austinhomas J. Benjamin Stainhekkekandam, M.D., ABFM., CAQSM. Primary Care and Sports Medicine  [facility]  Adjunct Professor of Family Medicine  [employer]

## 2018-04-25 NOTE — Assessment & Plan Note (Signed)
Repeat bilateral paracervical, trapezius, quadratus lumborum injections. As before she has baseline cervical facet arthritis worse at C7-T1 with multilevel cervical DDD which can all serve as future interventional targets if the above trigger points exhibit waning efficacy.

## 2018-04-29 ENCOUNTER — Other Ambulatory Visit: Payer: Self-pay

## 2018-04-29 MED ORDER — ZOLPIDEM TARTRATE 10 MG PO TABS: 5.0000 mg | ORAL_TABLET | Freq: Once | ORAL | 1 refills | Status: DC

## 2018-04-29 MED ORDER — HYDROCODONE-ACETAMINOPHEN 7.5-325 MG PO TABS: 1.0000 | ORAL_TABLET | Freq: Three times a day (TID) | ORAL | 0 refills | Status: DC

## 2018-04-29 NOTE — Telephone Encounter (Signed)
PDMP reviewed during this encounter.  

## 2018-04-29 NOTE — Telephone Encounter (Signed)
Tracy Massey called to request refills on Ambien and Hydrocodone.

## 2018-05-02 MED ORDER — HYDROCODONE-ACETAMINOPHEN 7.5-325 MG PO TABS: 1.0000 | ORAL_TABLET | Freq: Three times a day (TID) | ORAL | 0 refills | Status: DC

## 2018-05-02 NOTE — Telephone Encounter (Signed)
Tracy Massey called and left a message stating she did receive the refill of the Hydrocodone except it was for 60 tablets instead of 90 as her usual dose. Please advise.

## 2018-05-02 NOTE — Telephone Encounter (Signed)
OK, new rx sent.  

## 2018-05-02 NOTE — Telephone Encounter (Signed)
Left message advising of new prescription.  

## 2018-05-30 ENCOUNTER — Other Ambulatory Visit: Payer: Self-pay

## 2018-05-30 MED ORDER — HYDROCODONE-ACETAMINOPHEN 7.5-325 MG PO TABS: 1.0000 | ORAL_TABLET | Freq: Three times a day (TID) | ORAL | 0 refills | Status: DC

## 2018-05-30 NOTE — Telephone Encounter (Signed)
Alvino Chapel called for a refill on Hydrocodone. Please advise.

## 2018-07-01 ENCOUNTER — Other Ambulatory Visit: Payer: Self-pay

## 2018-07-01 MED ORDER — HYDROCODONE-ACETAMINOPHEN 7.5-325 MG PO TABS: 1.0000 | ORAL_TABLET | Freq: Three times a day (TID) | ORAL | 0 refills | Status: DC

## 2018-07-01 NOTE — Telephone Encounter (Signed)
Patient advised to schedule a virtual visit next week for follow up.

## 2018-07-12 ENCOUNTER — Ambulatory Visit: Payer: Medicare FFS | Admitting: Sports Medicine

## 2018-07-12 ENCOUNTER — Ambulatory Visit: Payer: Medicare FFS | Admitting: Family Medicine

## 2018-07-13 ENCOUNTER — Other Ambulatory Visit: Payer: Self-pay

## 2018-07-13 ENCOUNTER — Ambulatory Visit (INDEPENDENT_AMBULATORY_CARE_PROVIDER_SITE_OTHER): Payer: Medicare FFS | Admitting: Sports Medicine

## 2018-07-13 ENCOUNTER — Encounter: Payer: Self-pay | Admitting: Sports Medicine

## 2018-07-13 ENCOUNTER — Ambulatory Visit: Payer: Medicare FFS | Admitting: Family Medicine

## 2018-07-13 ENCOUNTER — Encounter: Payer: Self-pay | Admitting: Family Medicine

## 2018-07-13 VITALS — BP 113/62 | HR 66 | Temp 98.4°F | Ht 63.0 in | Wt 187.0 lb

## 2018-07-13 DIAGNOSIS — N183 Chronic kidney disease, stage 3 unspecified: Secondary | ICD-10-CM

## 2018-07-13 DIAGNOSIS — M5030 Other cervical disc degeneration, unspecified cervical region: Secondary | ICD-10-CM

## 2018-07-13 DIAGNOSIS — G8929 Other chronic pain: Secondary | ICD-10-CM

## 2018-07-13 DIAGNOSIS — M542 Cervicalgia: Secondary | ICD-10-CM

## 2018-07-13 DIAGNOSIS — R7989 Other specified abnormal findings of blood chemistry: Secondary | ICD-10-CM

## 2018-07-13 DIAGNOSIS — I10 Essential (primary) hypertension: Secondary | ICD-10-CM | POA: Diagnosis not present

## 2018-07-13 DIAGNOSIS — F321 Major depressive disorder, single episode, moderate: Secondary | ICD-10-CM | POA: Insufficient documentation

## 2018-07-13 DIAGNOSIS — R7301 Impaired fasting glucose: Secondary | ICD-10-CM

## 2018-07-13 DIAGNOSIS — F324 Major depressive disorder, single episode, in partial remission: Secondary | ICD-10-CM

## 2018-07-13 LAB — POCT GLYCOSYLATED HEMOGLOBIN (HGB A1C): Hemoglobin A1C: 5.8 % — AB (ref 4.0–5.6)

## 2018-07-13 MED ORDER — BUPROPION HCL ER (XL) 300 MG PO TB24: 300.0000 mg | ORAL_TABLET | Freq: Every day | ORAL | 1 refills | Status: DC

## 2018-07-13 MED ORDER — AMLODIPINE BESYLATE 5 MG PO TABS: 5.0000 mg | ORAL_TABLET | Freq: Every day | ORAL | 1 refills | Status: DC

## 2018-07-13 MED ORDER — HYDROCODONE-ACETAMINOPHEN 7.5-325 MG PO TABS: 1.0000 | ORAL_TABLET | Freq: Three times a day (TID) | ORAL | 0 refills | Status: DC

## 2018-07-13 MED ORDER — LISINOPRIL 20 MG PO TABS: 20.0000 mg | ORAL_TABLET | Freq: Every day | ORAL | 1 refills | Status: DC

## 2018-07-13 MED ORDER — METOPROLOL SUCCINATE ER 25 MG PO TB24: 25.0000 mg | ORAL_TABLET | Freq: Every day | ORAL | 1 refills | Status: DC

## 2018-07-13 NOTE — Assessment & Plan Note (Signed)
Well controlled. Continue current regimen. Follow up in  6 months.  

## 2018-07-13 NOTE — Assessment & Plan Note (Signed)
Work on stretches. Can use heat.

## 2018-07-13 NOTE — Assessment & Plan Note (Signed)
Continue current regimen.  RF sent ot pharmacy. F/U in 3 months.

## 2018-07-13 NOTE — Progress Notes (Signed)
Subjective:    CC: Neck and back pain  HPI: This is a very pleasant 68 year old female with known cervical lumbar DDD as well as facet arthritis, myofascial pain, she responds well to occasional trigger point injections the most recent of which were done 3 months ago.  She is having recurrence of neck and back pain, moderate, persistent, localized in trapezius, paracervical muscles is paralumbar muscles, she does desire repeat interventional treatment today.  No bowel or bladder dysfunction, saddle numbness, progressive weakness, constitutional symptoms.  I reviewed the past medical history, family history, social history, surgical history, and allergies today and no changes were needed.  Please see the problem list section below in epic for further details.  Past Medical History: Past Medical History:  Diagnosis Date  . Asthma   . Diverticulosis   . Hyperlipidemia   . Hypertension   . Menopausal syndrome   . Neck pain, chronic    Past Surgical History: Past Surgical History:  Procedure Laterality Date  . ABDOMINAL HYSTERECTOMY  1986   TAH w/o BSO/endometriosis  . APPENDECTOMY  1983  . CHOLECYSTECTOMY  1995   lap  . HAND SURGERY    . HEEL SPUR SURGERY  1996   LT foot  . KNEE ARTHROSCOPY  1983   RT knee and LT knee 1986, 1990  . TUBAL LIGATION     bilateral   Social History: Social History   Socioeconomic History  . Marital status: Married    Spouse name: Not on file  . Number of children: Not on file  . Years of education: Not on file  . Highest education level: Not on file  Occupational History  . Not on file  Social Needs  . Financial resource strain: Not on file  . Food insecurity:    Worry: Not on file    Inability: Not on file  . Transportation needs:    Medical: Not on file    Non-medical: Not on file  Tobacco Use  . Smoking status: Never Smoker  . Smokeless tobacco: Never Used  Substance and Sexual Activity  . Alcohol use: Yes    Comment: rare  .  Drug use: No  . 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  Lifestyle  . Physical activity:    Days per week: Not on file    Minutes per session: Not on file  . Stress: Not on file  Relationships  . Social connections:    Talks on phone: Not on file    Gets together: Not on file    Attends religious service: Not on file    Active member of club or organization: Not on file    Attends meetings of clubs or organizations: Not on file    Relationship status: Not on file  Other Topics Concern  . Not on file  Social History Narrative  . Not on file   Family History: Family History  Problem Relation Age of Onset  . Hypertension Mother   . Clotting disorder Mother   . Cancer Father [id number]       throat/died  . Hypertension Daughter   . Emphysema Paternal Grandfather   . Hypertension Brother    Allergies: Allergies  Allergen Reactions  . Iodinated Diagnostic Agents Anaphylaxis  . Other Anaphylaxis    Prescoline  . Latex Rash  . Penicillins Rash   Medications: See med rec.  Review of Systems: No  fevers, chills, night sweats, weight loss, chest pain, or shortness of breath.   Objective:    General: Well Developed, well nourished, and in no acute distress.  Neuro: Alert and oriented x3, extra-ocular muscles intact, sensation grossly intact.  HEENT: Normocephalic, atraumatic, pupils equal round reactive to light, neck supple, no masses, no lymphadenopathy, thyroid nonpalpable.  Skin: Warm and dry, no rashes. Cardiac: Regular rate and rhythm, no murmurs rubs or gallops, no lower extremity edema.  Respiratory: Clear to auscultation bilaterally. Not using accessory muscles, speaking in full sentences.  Procedure:  Injection of #6 trigger points Consent obtained and verified. Time-out conducted. Noted no overlying erythema, induration, or other signs of local infection. Skin prepped in a  sterile fashion. Topical analgesic spray: Ethyl chloride. Completed without difficulty. Meds: A total of 2 cc Kenalog 40, 4 cc lidocaine, 4 cc bupivacaine spread out between 6 trigger points, bilateral paracervical, bilateral trapezial, bilateral paralumbar. Pain immediately improved suggesting accurate placement of the medication. Advised to call if fevers/chills, erythema, induration, drainage, or persistent bleeding.  Impression and Recommendations:    Chronic neck pain Repeat bilateral paracervical, trapezius, quadratus lumborum injections. As before she has baseline cervical facet arthritis worse at C7-T1 with multilevel cervical DDD which can serve as a future interventional target if trigger point injections exhibit waning efficacy.   ___________________________________________ Ihor Austin. Benjamin Stain, M.D., ABFM., CAQSM. Primary Care and Sports Medicine [place] [facility]  Adjunct Professor of Family Medicine  [employer]

## 2018-07-13 NOTE — Assessment & Plan Note (Signed)
Repeat bilateral paracervical, trapezius, quadratus lumborum injections. As before she has baseline cervical facet arthritis worse at C7-T1 with multilevel cervical DDD which can serve as a future interventional target if trigger point injections exhibit waning efficacy.

## 2018-07-13 NOTE — Assessment & Plan Note (Signed)
Discussed new dx. Encourage d low sugar and low carb diet. Recheck in 6 months.

## 2018-07-13 NOTE — Assessment & Plan Note (Signed)
Not well controlled.  Discussed options. Will increase wellbutrin for a couple of months. If not helpful she will call me back. Discuss virtual therapy/counseling. She says she will think about it. F/U in 3 mo.

## 2018-07-13 NOTE — Assessment & Plan Note (Signed)
Will follow renal function Q 6 months. Avoid NSAIDs.
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
## 2018-07-14 LAB — COMPLETE METABOLIC PANEL WITH GFR
AG Ratio: 1.8 (calc) (ref 1.0–2.5)
ALT: 12 U/L (ref 6–29)
AST: 18 U/L (ref 10–35)
Albumin: 4.2 g/dL (ref 3.6–5.1)
Alkaline phosphatase (APISO): 117 U/L (ref 37–153)
BUN/Creatinine Ratio: 9 (calc) (ref 6–22)
BUN: 15 mg/dL (ref 7–25)
CO2: 28 mmol/L (ref 20–32)
Calcium: 9.5 mg/dL (ref 8.6–10.4)
Chloride: 106 mmol/L (ref 98–110)
Creat: 1.66 mg/dL — ABNORMAL HIGH (ref 0.50–0.99)
GFR, Est African American: 36 mL/min/{1.73_m2} — ABNORMAL LOW (ref >=60)
GFR, Est Non African American: 31 mL/min/{1.73_m2} — ABNORMAL LOW (ref >=60)
Globulin: 2.3 g/dL (calc) (ref 1.9–3.7)
Glucose, Bld: 77 mg/dL (ref 65–99)
Potassium: 3.8 mmol/L (ref 3.5–5.3)
Sodium: 143 mmol/L (ref 135–146)
Total Bilirubin: 0.5 mg/dL (ref 0.2–1.2)
Total Protein: 6.5 g/dL (ref 6.1–8.1)

## 2018-07-14 LAB — LIPID PANEL
Cholesterol: 174 mg/dL (ref <200)
HDL: 55 mg/dL (ref >=50)
LDL Cholesterol (Calc): 98 mg/dL (calc)
Non-HDL Cholesterol (Calc): 119 mg/dL (calc) (ref <130)
Total CHOL/HDL Ratio: 3.2 (calc) (ref <5.0)
Triglycerides: 113 mg/dL (ref <150)

## 2018-07-14 LAB — MICROALBUMIN / CREATININE URINE RATIO
Creatinine, Urine: 310 mg/dL — ABNORMAL HIGH (ref 20–275)
Microalb Creat Ratio: 75 mcg/mg creat — ABNORMAL HIGH (ref <30)
Microalb, Ur: 23.2 mg/dL

## 2018-07-15 NOTE — Addendum Note (Signed)
Addended by: Nani Gasser D on: 07/15/2018 09:43 AM   Modules accepted: Orders

## 2018-07-16 LAB — PAIN MGMT, PROFILE 6 CONF W/O MM, U
6 Acetylmorphine: NEGATIVE ng/mL (ref <10)
Alcohol Metabolites: NEGATIVE ng/mL (ref <500)
Amphetamine: NEGATIVE ng/mL (ref <250)
Amphetamines: NEGATIVE ng/mL (ref <500)
Barbiturates: NEGATIVE ng/mL (ref <300)
Benzodiazepines: NEGATIVE ng/mL (ref <100)
Cocaine Metabolite: NEGATIVE ng/mL (ref <150)
Codeine: NEGATIVE ng/mL (ref <50)
Creatinine: 272 mg/dL
Hydrocodone: 5752 ng/mL — ABNORMAL HIGH (ref <50)
Hydromorphone: 179 ng/mL — ABNORMAL HIGH (ref <50)
Marijuana Metabolite: NEGATIVE ng/mL (ref <20)
Methadone Metabolite: NEGATIVE ng/mL (ref <100)
Methamphetamine: NEGATIVE ng/mL (ref <250)
Morphine: NEGATIVE ng/mL (ref <50)
Norhydrocodone: 6973 ng/mL — ABNORMAL HIGH (ref <50)
Opiates: POSITIVE ng/mL — AB (ref <100)
Oxidant: NEGATIVE ug/mL (ref <200)
Oxycodone: NEGATIVE ng/mL (ref <100)
Phencyclidine: NEGATIVE ng/mL (ref <25)
pH: 5.74 (ref 4.5–9.0)

## 2018-07-19 ENCOUNTER — Other Ambulatory Visit: Payer: Self-pay | Admitting: *Deleted

## 2018-07-19 ENCOUNTER — Telehealth: Payer: Self-pay | Admitting: Family Medicine

## 2018-07-19 DIAGNOSIS — F321 Major depressive disorder, single episode, moderate: Secondary | ICD-10-CM

## 2018-07-19 DIAGNOSIS — I739 Peripheral vascular disease, unspecified: Secondary | ICD-10-CM

## 2018-07-19 MED ORDER — VENLAFAXINE HCL ER 150 MG PO CP24: 150.0000 mg | ORAL_CAPSULE | Freq: Every day | ORAL | 3 refills | Status: DC

## 2018-07-19 MED ORDER — VENLAFAXINE HCL ER 150 MG PO CP24: 150.0000 mg | ORAL_CAPSULE | Freq: Every day | ORAL | 0 refills | Status: DC

## 2018-07-19 MED ORDER — ATORVASTATIN CALCIUM 10 MG PO TABS: 10.0000 mg | ORAL_TABLET | Freq: Every day | ORAL | 3 refills | Status: DC

## 2018-07-19 NOTE — Telephone Encounter (Signed)
Patient called.  She has not gotten refills on her  Atorvistatin and Effexor.  Thanks

## 2018-07-19 NOTE — Telephone Encounter (Signed)
My apologies I thought that this was signed off. I sent this just now please let her know!! Thank you.Heath Gold, CMA

## 2018-07-25 ENCOUNTER — Ambulatory Visit: Payer: Medicare FFS | Admitting: Sports Medicine

## 2018-07-25 ENCOUNTER — Other Ambulatory Visit: Payer: Self-pay

## 2018-07-25 ENCOUNTER — Ambulatory Visit (INDEPENDENT_AMBULATORY_CARE_PROVIDER_SITE_OTHER): Payer: Medicare FFS

## 2018-07-25 DIAGNOSIS — R7989 Other specified abnormal findings of blood chemistry: Secondary | ICD-10-CM

## 2018-07-25 DIAGNOSIS — N183 Chronic kidney disease, stage 3 unspecified: Secondary | ICD-10-CM

## 2018-07-25 DIAGNOSIS — I10 Essential (primary) hypertension: Secondary | ICD-10-CM | POA: Diagnosis not present

## 2018-08-29 ENCOUNTER — Telehealth: Payer: Self-pay

## 2018-08-29 DIAGNOSIS — G8929 Other chronic pain: Secondary | ICD-10-CM

## 2018-08-29 MED ORDER — HYDROCODONE-ACETAMINOPHEN 7.5-325 MG PO TABS: 1.0000 | ORAL_TABLET | Freq: Three times a day (TID) | ORAL | 0 refills | Status: DC

## 2018-08-29 NOTE — Telephone Encounter (Signed)
Left msg advising pt RX sent

## 2018-08-29 NOTE — Telephone Encounter (Signed)
rx sent

## 2018-08-29 NOTE — Telephone Encounter (Signed)
Patient called requesting RF for Norco be sent to [facility]  Last RX written  07/30/18  Last OV 07/13/18  RX pended.

## 2018-09-09 ENCOUNTER — Ambulatory Visit (INDEPENDENT_AMBULATORY_CARE_PROVIDER_SITE_OTHER): Payer: Medicare PPO | Admitting: Family Medicine

## 2018-09-09 ENCOUNTER — Encounter: Payer: Self-pay | Admitting: Family Medicine

## 2018-09-09 VITALS — BP 134/73 | HR 74 | Ht 63.0 in | Wt 183.0 lb

## 2018-09-09 DIAGNOSIS — F321 Major depressive disorder, single episode, moderate: Secondary | ICD-10-CM

## 2018-09-09 MED ORDER — VENLAFAXINE HCL ER 37.5 MG PO CP24: ORAL_CAPSULE | ORAL | 0 refills | Status: DC

## 2018-09-09 MED ORDER — DULOXETINE HCL 30 MG PO CPEP: 30.0000 mg | ORAL_CAPSULE | Freq: Every day | ORAL | 0 refills | Status: DC

## 2018-09-09 NOTE — Progress Notes (Signed)
Established Patient Office Visit  Subjective:  Patient ID: Tracy Massey, female    DOB: 05-29-1950  Age: 68 y.o. MRN: [id number]  CC:  Chief Complaint  Patient presents with  . Medication Management  . mood    HPI Tracy Massey presents for mood medication.  She feels like the Effexor and the Wellbutrin are not helping.  Reports little interest or pleasure doing things nearly every day as well as fatigue and low energy also feeling bad about herself.  No thoughts of wanting to harm herself.  She also reports high levels of restlessness and difficulty relaxing.  She is just been really struggling emotionally since last September when she broke her ankle and was not able to really ambulate very much for about 3 months.  She just has no energy and feels completely apathetic.  She says she is had a decreased appetite.  She feels like she is sleeping excessively.  She is currently on Effexor and Wellbutrin.  She also tried Prozac before and was on it for most 3 years but says it was discontinued.  She does not really remember why, but does not remember having any specific side effects.  Past Medical History:  Diagnosis Date  . Asthma   . Diverticulosis   . Hyperlipidemia   . Hypertension   . Menopausal syndrome   . Neck pain, chronic     Past Surgical History:  Procedure Laterality Date  . ABDOMINAL HYSTERECTOMY  1986   TAH w/o BSO/endometriosis  . APPENDECTOMY  1983  . CHOLECYSTECTOMY  1995   lap  . HAND SURGERY    . HEEL SPUR SURGERY  1996   LT foot  . KNEE ARTHROSCOPY  1983   RT knee and LT knee 1986, 1990  . TUBAL LIGATION     bilateral    Family History  Problem Relation Age of Onset  . Hypertension Mother   . Clotting disorder Mother   . Cancer Father [id number]       throat/died  . Hypertension Daughter   . Emphysema Paternal Grandfather   . Hypertension Brother     Social History   Socioeconomic History  . Marital status: Married    Spouse name: Not  on file  . Number of children: Not on file  . Years of education: Not on file  . Highest education level: Not on file  Occupational History  . Not on file  Social Needs  . Financial resource strain: Not on file  . Food insecurity    Worry: Not on file    Inability: Not on file  . Transportation needs    Medical: Not on file    Non-medical: Not on file  Tobacco Use  . Smoking status: Never Smoker  . Smokeless tobacco: Never Used  Substance and Sexual Activity  . Alcohol use: Yes    Comment: rare  . Drug use: No  . 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  Lifestyle  . Physical activity    Days per week: Not on file    Minutes per session: Not on file  . Stress: Not on file  Relationships  . Social Musicianconnections    Talks on phone: Not on file    Gets together: Not on file    Attends religious service: Not on file    Active member of club or organization: Not on file  Attends meetings of clubs or organizations: Not on file    Relationship status: Not on file  . Intimate partner violence    Fear of current or ex partner: Not on file    Emotionally abused: Not on file    Physically abused: Not on file    Forced sexual activity: Not on file  Other Topics Concern  . Not on file  Social History Narrative  . Not on file    Outpatient Medications Prior to Visit  Medication Sig Dispense Refill  . amLODipine (NORVASC) 5 MG tablet Take 1 tablet (5 mg total) by mouth daily. 90 tablet 1  . aspirin EC 81 MG tablet Take 81 mg by mouth daily.    Marland Kitchen atorvastatin (LIPITOR) 10 MG tablet Take 1 tablet (10 mg total) by mouth at bedtime. 90 tablet 3  . buPROPion (WELLBUTRIN XL) 300 MG 24 hr tablet Take 1 tablet (300 mg total) by mouth daily. 90 tablet 1  . HYDROcodone-acetaminophen (NORCO) 7.5-325 MG tablet Take 1 tablet by mouth every 8 (eight) hours. 90 tablet 0  . lisinopril  (PRINIVIL,ZESTRIL) 20 MG tablet Take 1 tablet (20 mg total) by mouth daily. 90 tablet 1  . metoprolol succinate (TOPROL-XL) 25 MG 24 hr tablet Take 1 tablet (25 mg total) by mouth daily. 90 tablet 1  . venlafaxine XR (EFFEXOR-XR) 150 MG 24 hr capsule Take 1 capsule (150 mg total) by mouth daily. 30 capsule 0  . zolpidem (AMBIEN) 10 MG tablet Take 0.5 tablets (5 mg total) by mouth once for 1 dose. 45 tablet 1   No facility-administered medications prior to visit.     Allergies  Allergen Reactions  . Iodinated Diagnostic Agents Anaphylaxis  . Other Anaphylaxis    Prescoline  . Latex Rash  . Penicillins Rash    ROS Review of Systems    Objective:    Physical Exam  BP 134/73   Pulse 74   Ht 5\' 3"  (1.6 m)   Wt 183 lb (83 kg)   SpO2 98%   BMI 32.42 kg/m  Wt Readings from Last 3 Encounters:  09/09/18 183 lb (83 kg)  07/13/18 187 lb (84.8 kg)  07/13/18 187 lb (84.8 kg)     There are no preventive care reminders to display for this patient.  There are no preventive care reminders to display for this patient.  Lab Results  Component Value Date   TSH 4.310 06/01/2011   Lab Results  Component Value Date   WBC 8.0 06/01/2011   HGB 12.0 06/01/2011   HCT 40.4 06/01/2011   MCV 96.2 06/01/2011   PLT 273 06/01/2011   Lab Results  Component Value Date   NA 143 07/13/2018   K 3.8 07/13/2018   CO2 28 07/13/2018   GLUCOSE 77 07/13/2018   BUN 15 07/13/2018   CREATININE 1.66 (H) 07/13/2018   BILITOT 0.5 07/13/2018   ALKPHOS 85 06/01/2011   AST 18 07/13/2018   ALT 12 07/13/2018   PROT 6.5 07/13/2018   ALBUMIN 4.1 06/01/2011   CALCIUM 9.5 07/13/2018   Lab Results  Component Value Date   CHOL 174 07/13/2018   Lab Results  Component Value Date   HDL 55 07/13/2018   Lab Results  Component Value Date   LDLCALC 98 07/13/2018   Lab Results  Component Value Date   TRIG 113 07/13/2018   Lab Results  Component Value Date   CHOLHDL 3.2 07/13/2018   Lab Results   Component Value Date  HGBA1C 5.8 (A) 07/13/2018      Assessment & Plan:   Problem List Items Addressed This Visit      Other   Depression, major, single episode, moderate (HCC) - Primary    PHQ 9 score of 19 and gad 7 score of 11.  Rates her depression symptoms is very difficult and her anxiety symptoms as somewhat difficult.      Relevant Medications   venlafaxine XR (EFFEXOR-XR) 37.5 MG 24 hr capsule   DULoxetine (CYMBALTA) 30 MG capsule     Depression-symptoms not currently controlled.  Discussed options including referral to therapy/counseling.  They are offering virtual visits.  She will consider it and let me know.  In the meantime we will go to wean her Effexor and switch her to Cymbalta which I think could also be helpful with her pain management for her degenerative disc disease.  We will continue with the Wellbutrin for now with the goal of help ultimately trying to discontinue it once we switch medications if she is doing well.  Meds ordered this encounter  Medications  . venlafaxine XR (EFFEXOR-XR) 37.5 MG 24 hr capsule    Sig: 2 tabs po QD x 10 days, then 1 tab po QD x 10 days, then stop    Dispense:  30 capsule    Refill:  0    THIS IS A SHORT TERM SUPPLY UNTIL SHE RECEIVES HER MAIL ORDER  . DULoxetine (CYMBALTA) 30 MG capsule    Sig: Take 1 capsule (30 mg total) by mouth daily. Please start when complete the venlafaxine    Dispense:  30 capsule    Refill:  0    Follow-up: Return in about 5 weeks (around 10/14/2018) for New start medication.    Nani Gasseratherine , MD

## 2018-09-09 NOTE — Assessment & Plan Note (Signed)
PHQ 9 score of 19 and gad 7 score of 11.  Rates her depression symptoms is very difficult and her anxiety symptoms as somewhat difficult.

## 2018-09-13 ENCOUNTER — Encounter: Payer: Self-pay | Admitting: Family Medicine

## 2018-09-29 ENCOUNTER — Other Ambulatory Visit: Payer: Self-pay

## 2018-09-29 DIAGNOSIS — G8929 Other chronic pain: Secondary | ICD-10-CM

## 2018-09-29 MED ORDER — ZOLPIDEM TARTRATE 10 MG PO TABS: 5.0000 mg | ORAL_TABLET | Freq: Once | ORAL | 1 refills | Status: DC

## 2018-09-29 MED ORDER — HYDROCODONE-ACETAMINOPHEN 7.5-325 MG PO TABS: 1.0000 | ORAL_TABLET | Freq: Three times a day (TID) | ORAL | 0 refills | Status: DC

## 2018-09-29 NOTE — Telephone Encounter (Signed)
Tracy Massey called to request a refill on Hydrocodone and Ambien.

## 2018-10-13 ENCOUNTER — Other Ambulatory Visit: Payer: Self-pay

## 2018-10-13 ENCOUNTER — Ambulatory Visit (INDEPENDENT_AMBULATORY_CARE_PROVIDER_SITE_OTHER): Payer: Medicare PPO | Admitting: Sports Medicine

## 2018-10-13 ENCOUNTER — Encounter: Payer: Self-pay | Admitting: Sports Medicine

## 2018-10-13 ENCOUNTER — Ambulatory Visit (INDEPENDENT_AMBULATORY_CARE_PROVIDER_SITE_OTHER): Payer: Medicare PPO | Admitting: Family Medicine

## 2018-10-13 ENCOUNTER — Encounter: Payer: Self-pay | Admitting: Family Medicine

## 2018-10-13 VITALS — BP 114/68 | HR 71 | Ht 63.0 in | Wt 184.0 lb

## 2018-10-13 DIAGNOSIS — F321 Major depressive disorder, single episode, moderate: Secondary | ICD-10-CM | POA: Diagnosis not present

## 2018-10-13 DIAGNOSIS — F4321 Adjustment disorder with depressed mood: Secondary | ICD-10-CM | POA: Diagnosis not present

## 2018-10-13 DIAGNOSIS — E669 Obesity, unspecified: Secondary | ICD-10-CM | POA: Diagnosis not present

## 2018-10-13 DIAGNOSIS — Z1231 Encounter for screening mammogram for malignant neoplasm of breast: Secondary | ICD-10-CM | POA: Diagnosis not present

## 2018-10-13 DIAGNOSIS — M7918 Myalgia, other site: Secondary | ICD-10-CM | POA: Diagnosis not present

## 2018-10-13 MED ORDER — METFORMIN HCL 500 MG PO TABS: 500.0000 mg | ORAL_TABLET | Freq: Two times a day (BID) | ORAL | 3 refills | Status: DC

## 2018-10-13 MED ORDER — DULOXETINE HCL 30 MG PO CPEP: 30.0000 mg | ORAL_CAPSULE | Freq: Every day | ORAL | 0 refills | Status: DC

## 2018-10-13 NOTE — Assessment & Plan Note (Signed)
Complicated by grief with the recent passing of her husband.  Discussed possible referral for counseling/therapy.  She will think about it.  She wants to continue with her current dose of the Cymbalta.  If after another month she would like me to just the dose and more than happy to but otherwise I will see her back in about 2 months.

## 2018-10-13 NOTE — Assessment & Plan Note (Signed)
Injection of multiple trigger points in the paracervical and paralumbar musculature. This seems to work well q. 3 to 4 months. Return as needed.

## 2018-10-13 NOTE — Assessment & Plan Note (Signed)
This encouraged her to get out of the house a little bit more do some walking stay active walk in the yard.  Hydrate well.  We also discussed a trial of metformin for a month just to see if she feels like it helps with weight loss and maybe even curbing her appetite.  Did warn about potential for diarrhea.  Plus with prediabetes it will help reduce her glucose numbers especially if she has been eating poorly and overeating at times.  Follow-up in 2 months.

## 2018-10-13 NOTE — Progress Notes (Addendum)
Established Patient Office Visit  Subjective:  Patient ID: Tracy Massey, female    DOB: 02-13-1951  Age: 68 y.o. MRN: [id number]  CC:  Chief Complaint  Patient presents with  . mood    HPI Tracy Massey presents for f/u Mood.  She is greiving for the loss of her husband. She is doing OK on the Cymbalta. Doesn't want to change medication.  I had transitioned her off the velafaxine.  She is doing well on the medication so far without any negative side effects.  She is wants to continue with her current dose for at least another month.  She is also very concerned about her weight gain.  Though she is only gone up 1 pound since she was last here but feels like she has been eating a lot more since her husband passed away she wants to know if there is anything that she could take that might help curb her appetite.  She does have prediabetes.  Last mammo 1.5 yr ago at [facility].   Past Medical History:  Diagnosis Date  . Asthma   . Diverticulosis   . Hyperlipidemia   . Hypertension   . Menopausal syndrome   . Neck pain, chronic     Past Surgical History:  Procedure Laterality Date  . ABDOMINAL HYSTERECTOMY  1986   TAH w/o BSO/endometriosis  . APPENDECTOMY  1983  . CHOLECYSTECTOMY  1995   lap  . HAND SURGERY    . [place]   LT foot  . KNEE ARTHROSCOPY  1983   RT knee and LT knee 1986, 1990  . TUBAL LIGATION     bilateral    Family History  Problem Relation Age of Onset  . Hypertension Mother   . Clotting disorder Mother   . Cancer Father 77       throat/died  . Hypertension Daughter   . Emphysema Paternal Grandfather   . Hypertension Brother     Social History   Socioeconomic History  . Marital status: Married    Spouse name: Not on file  . Number of children: Not on file  . Years of education: Not on file  . Highest education level: Not on file  Occupational History  . Not on file  Social Needs  . Financial resource strain: Not on  file  . Food insecurity    Worry: Not on file    Inability: Not on file  . Transportation needs    Medical: Not on file    Non-medical: Not on file  Tobacco Use  . Smoking status: Never Smoker  . Smokeless tobacco: Never Used  Substance and Sexual Activity  . Alcohol use: Yes    Comment: rare  . Drug use: No  . 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  Lifestyle  . Physical activity    Days per week: Not on file    Minutes per session: Not on file  . Stress: Not on file  Relationships  . Social Herbalist on phone: Not on file    Gets together: Not on file    Attends religious service: Not on file    Active member of club or organization: Not on file    Attends meetings of clubs or organizations: Not on file    Relationship status: Not on file  . Intimate partner violence  Fear of current or ex partner: Not on file    Emotionally abused: Not on file    Physically abused: Not on file    Forced sexual activity: Not on file  Other Topics Concern  . Not on file  Social History Narrative  . Not on file    Outpatient Medications Prior to Visit  Medication Sig Dispense Refill  . amLODipine (NORVASC) 5 MG tablet Take 1 tablet (5 mg total) by mouth daily. 90 tablet 1  . aspirin EC 81 MG tablet Take 81 mg by mouth daily.    Marland Kitchen. atorvastatin (LIPITOR) 10 MG tablet Take 1 tablet (10 mg total) by mouth at bedtime. 90 tablet 3  . buPROPion (WELLBUTRIN XL) 300 MG 24 hr tablet Take 1 tablet (300 mg total) by mouth daily. 90 tablet 1  . HYDROcodone-acetaminophen (NORCO) 7.5-325 MG tablet Take 1 tablet by mouth every 8 (eight) hours. 90 tablet 0  . lisinopril (PRINIVIL,ZESTRIL) 20 MG tablet Take 1 tablet (20 mg total) by mouth daily. 90 tablet 1  . metoprolol succinate (TOPROL-XL) 25 MG 24 hr tablet Take 1 tablet (25 mg total) by mouth daily. 90 tablet 1  . zolpidem (AMBIEN) 10 MG  tablet Take 0.5 tablets (5 mg total) by mouth once for 1 dose. 45 tablet 1  . DULoxetine (CYMBALTA) 30 MG capsule Take 1 capsule (30 mg total) by mouth daily. Please start when complete the venlafaxine 30 capsule 0   No facility-administered medications prior to visit.     Allergies  Allergen Reactions  . Iodinated Diagnostic Agents Anaphylaxis  . Other Anaphylaxis    Prescoline  . Latex Rash  . Penicillins Rash    ROS Review of Systems    Objective:    Physical Exam  Constitutional: She is oriented to person, place, and time. She appears well-developed and well-nourished.  HENT:  Head: Normocephalic and atraumatic.  Cardiovascular: Normal rate, regular rhythm and normal heart sounds.  Pulmonary/Chest: Effort normal and breath sounds normal.  Neurological: She is alert and oriented to person, place, and time.  Skin: Skin is warm and dry.  Psychiatric: She has a normal mood and affect. Her behavior is normal.    BP 114/68   Pulse 71   Ht 5\' 3"  (1.6 m)   Wt 184 lb (83.5 kg)   BMI 32.59 kg/m  Wt Readings from Last 3 Encounters:  10/13/18 184 lb (83.5 kg)  10/13/18 184 lb (83.5 kg)  09/09/18 183 lb (83 kg)     There are no preventive care reminders to display for this patient.  There are no preventive care reminders to display for this patient.  Lab Results  Component Value Date   TSH 4.310 06/01/2011   Lab Results  Component Value Date   WBC 8.0 06/01/2011   HGB 12.0 06/01/2011   HCT 40.4 06/01/2011   MCV 96.2 06/01/2011   PLT 273 06/01/2011   Lab Results  Component Value Date   NA 143 07/13/2018   K 3.8 07/13/2018   CO2 28 07/13/2018   GLUCOSE 77 07/13/2018   BUN 15 07/13/2018   CREATININE 1.66 (H) 07/13/2018   BILITOT 0.5 07/13/2018   ALKPHOS 85 06/01/2011   AST 18 07/13/2018   ALT 12 07/13/2018   PROT 6.5 07/13/2018   ALBUMIN 4.1 06/01/2011   CALCIUM 9.5 07/13/2018   Lab Results  Component Value Date   CHOL 174 07/13/2018   Lab Results   Component Value Date   HDL 55  07/13/2018   Lab Results  Component Value Date   LDLCALC 98 07/13/2018   Lab Results  Component Value Date   TRIG 113 07/13/2018   Lab Results  Component Value Date   CHOLHDL 3.2 07/13/2018   Lab Results  Component Value Date   HGBA1C 5.8 (A) 07/13/2018      Assessment & Plan:   Problem List Items Addressed This Visit      Other   Obesity, Class I, BMI 30-34.9    This encouraged her to get out of the house a little bit more do some walking stay active walk in the yard.  Hydrate well.  We also discussed a trial of metformin for a month just to see if she feels like it helps with weight loss and maybe even curbing her appetite.  Did warn about potential for diarrhea.  Plus with prediabetes it will help reduce her glucose numbers especially if she has been eating poorly and overeating at times.  Follow-up in 2 months.      Depression, major, single episode, moderate (HCC)    Complicated by grief with the recent passing of her husband.  Discussed possible referral for counseling/therapy.  She will think about it.  She wants to continue with her current dose of the Cymbalta.  If after another month she would like me to just the dose and more than happy to but otherwise I will see her back in about 2 months.      Relevant Medications   DULoxetine (CYMBALTA) 30 MG capsule    Other Visit Diagnoses    Screening mammogram, encounter for    -  Primary   Relevant Orders   MM 3D SCREEN BREAST BILATERAL   Grief        \Grief-offered refer her to therapy/counseling.  She will think about it and let me know.  Patient [facility] but she will schedule downstairs.  Order placed today.  Meds ordered this encounter  Medications  . metFORMIN (GLUCOPHAGE) 500 MG tablet    Sig: Take 1 tablet (500 mg total) by mouth 2 (two) times daily with a meal.    Dispense:  60 tablet    Refill:  3  . DULoxetine (CYMBALTA) 30 MG capsule     Sig: Take 1 capsule (30 mg total) by mouth daily. Please start when complete the venlafaxine    Dispense:  90 capsule    Refill:  0    Follow-up: Return in about 2 months (around 12/14/2018) for Mood medication .    Nani Gasseratherine Maranatha Grossi, MD

## 2018-10-13 NOTE — Progress Notes (Signed)
Subjective:    CC: Shoulder and back pain  HPI: This is a pleasant 68 year old female, she has cervical and lumbar DDD, facet arthritis, myofascial pain syndrome and responds extremely well to occasional trigger point injections, the most recent which were done on April 15.  She is having a recurrence of pain, worst in the left low back but in both trapezii as well as the right low back as well.  Of note she did recently lose her husband.  Moderate, persistent, localized with above radiation.  I reviewed the past medical history, family history, social history, surgical history, and allergies today and no changes were needed.  Please see the problem list section below in epic for further details.  Past Medical History: Past Medical History:  Diagnosis Date  . Asthma   . Diverticulosis   . Hyperlipidemia   . Hypertension   . Menopausal syndrome   . Neck pain, chronic    Past Surgical History: Past Surgical History:  Procedure Laterality Date  . ABDOMINAL HYSTERECTOMY  1986   TAH w/o BSO/endometriosis  . APPENDECTOMY  1983  . CHOLECYSTECTOMY  1995   lap  . HAND SURGERY    . [place]   LT foot  . KNEE ARTHROSCOPY  1983   RT knee and LT knee 1986, 1990  . TUBAL LIGATION     bilateral   Social History: Social History   Socioeconomic History  . Marital status: Married    Spouse name: Not on file  . Number of children: Not on file  . Years of education: Not on file  . Highest education level: Not on file  Occupational History  . Not on file  Social Needs  . Financial resource strain: Not on file  . Food insecurity    Worry: Not on file    Inability: Not on file  . Transportation needs    Medical: Not on file    Non-medical: Not on file  Tobacco Use  . Smoking status: Never Smoker  . Smokeless tobacco: Never Used  Substance and Sexual Activity  . Alcohol use: Yes    Comment: rare  . Drug use: No  . 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  Lifestyle  . Physical activity    Days per week: Not on file    Minutes per session: Not on file  . Stress: Not on file  Relationships  . Social Herbalist on phone: Not on file    Gets together: Not on file    Attends religious service: Not on file    Active member of club or organization: Not on file    Attends meetings of clubs or organizations: Not on file    Relationship status: Not on file  Other Topics Concern  . Not on file  Social History Narrative  . Not on file   Family History: Family History  Problem Relation Age of Onset  . Hypertension Mother   . Clotting disorder Mother   . Cancer Father 55       throat/died  . Hypertension Daughter   . Emphysema Paternal Grandfather   . Hypertension Brother    Allergies: Allergies  Allergen Reactions  . Iodinated Diagnostic Agents Anaphylaxis  . Other Anaphylaxis    Prescoline  . Latex Rash  . Penicillins Rash   Medications: See med rec.  Review of  Systems: No fevers, chills, night sweats, weight loss, chest pain, or shortness of breath.   Objective:    General: Well Developed, well nourished, and in no acute distress.  Neuro: Alert and oriented x3, extra-ocular muscles intact, sensation grossly intact.  HEENT: Normocephalic, atraumatic, pupils equal round reactive to light, neck supple, no masses, no lymphadenopathy, thyroid nonpalpable.  Skin: Warm and dry, no rashes. Cardiac: Regular rate and rhythm, no murmurs rubs or gallops, no lower extremity edema.  Respiratory: Clear to auscultation bilaterally. Not using accessory muscles, speaking in full sentences.  Procedure:  Injection of #7 paralumbar and paracervical trigger points Consent obtained and verified. Time-out conducted. Noted no overlying erythema, induration, or other signs of local infection. Skin prepped in a sterile fashion. Topical analgesic  spray: Ethyl chloride. Completed without difficulty. Meds: A total of 2 cc Kenalog 40, 4 cc lidocaine, 4 cc bupivacaine spread out between the triggerpoints Pain immediately improved suggesting accurate placement of the medication. Advised to call if fevers/chills, erythema, induration, drainage, or persistent bleeding.  Impression and Recommendations:    Myofascial pain syndrome Injection of multiple trigger points in the paracervical and paralumbar musculature. This seems to work well q. 3 to 4 months. Return as needed.   ___________________________________________ Ihor Austinhomas J. Benjamin Stainhekkekandam, M.D., ABFM., CAQSM. Primary Care and Sports Medicine [facility]  Adjunct Professor of Family Medicine  [employer]

## 2018-10-17 ENCOUNTER — Other Ambulatory Visit: Payer: Self-pay | Admitting: *Deleted

## 2018-10-17 ENCOUNTER — Other Ambulatory Visit: Payer: Self-pay | Admitting: Family Medicine

## 2018-10-17 DIAGNOSIS — R82998 Other abnormal findings in urine: Secondary | ICD-10-CM

## 2018-10-17 DIAGNOSIS — N183 Chronic kidney disease, stage 3 unspecified: Secondary | ICD-10-CM

## 2018-10-17 LAB — C3 AND C4
C3 Complement: 141 mg/dL (ref 83–193)
C4 Complement: 44 mg/dL (ref 15–57)

## 2018-10-17 LAB — URINALYSIS, MICROSCOPIC ONLY
Bacteria, UA: NONE SEEN /HPF
Hyaline Cast: NONE SEEN /LPF
RBC / HPF: NONE SEEN /HPF (ref 0–2)

## 2018-10-17 LAB — CBC
HCT: 41.7 % (ref 35.0–45.0)
Hemoglobin: 13.7 g/dL (ref 11.7–15.5)
MCH: 30.4 pg (ref 27.0–33.0)
MCHC: 32.9 g/dL (ref 32.0–36.0)
MCV: 92.5 fL (ref 80.0–100.0)
MPV: 10.2 fL (ref 7.5–12.5)
Platelets: 277 10*3/uL (ref 140–400)
RBC: 4.51 10*6/uL (ref 3.80–5.10)
RDW: 12.7 % (ref 11.0–15.0)
WBC: 5.7 10*3/uL (ref 3.8–10.8)

## 2018-10-17 LAB — CREATININE WITH EST GFR
Creat: 1.52 mg/dL — ABNORMAL HIGH (ref 0.50–0.99)
GFR, Est African American: 40 mL/min/{1.73_m2} — ABNORMAL LOW (ref >=60)
GFR, Est Non African American: 35 mL/min/{1.73_m2} — ABNORMAL LOW (ref >=60)

## 2018-10-17 LAB — PROTEIN ELECTROPHORESIS,RANDOM URN
Albumin: 39 %
Alpha-1-Globulin, U: 4 %
Alpha-2-Globulin, U: 13 %
Beta Globulin, U: 28 %
Creatinine, Urine: 217 mg/dL (ref 20–275)
Gamma Globulin, U: 15 %
Protein/Creat Ratio: 120 mg/g creat (ref 21–161)
Protein/Creatinine Ratio: 0.12 mg/mg creat (ref 0.021–0.16)
Total Protein, Urine: 26 mg/dL — ABNORMAL HIGH (ref 5–24)

## 2018-10-17 LAB — PTH, INTACT AND CALCIUM
Calcium: 9.9 mg/dL (ref 8.6–10.4)
PTH: 41 pg/mL (ref 14–64)

## 2018-10-17 LAB — PROTEIN ELECTROPHORESIS, SERUM
Albumin ELP: 3.9 g/dL (ref 3.8–4.8)
Alpha 1: 0.3 g/dL (ref 0.2–0.3)
Alpha 2: 0.8 g/dL (ref 0.5–0.9)
Beta 2: 0.4 g/dL (ref 0.2–0.5)
Beta Globulin: 0.4 g/dL (ref 0.4–0.6)
Gamma Globulin: 0.8 g/dL (ref 0.8–1.7)
Total Protein: 6.6 g/dL (ref 6.1–8.1)

## 2018-10-17 LAB — SEDIMENTATION RATE

## 2018-10-17 LAB — PHOSPHORUS: Phosphorus: 4.4 mg/dL — ABNORMAL HIGH (ref 2.1–4.3)

## 2018-10-27 ENCOUNTER — Ambulatory Visit (INDEPENDENT_AMBULATORY_CARE_PROVIDER_SITE_OTHER): Payer: Medicare PPO | Admitting: Family Medicine

## 2018-10-27 ENCOUNTER — Other Ambulatory Visit: Payer: Self-pay

## 2018-10-27 ENCOUNTER — Encounter: Payer: Self-pay | Admitting: Family Medicine

## 2018-10-27 VITALS — BP 154/61 | HR 72 | Ht 63.0 in | Wt 178.0 lb

## 2018-10-27 DIAGNOSIS — J040 Acute laryngitis: Secondary | ICD-10-CM | POA: Diagnosis not present

## 2018-10-27 DIAGNOSIS — G8929 Other chronic pain: Secondary | ICD-10-CM | POA: Diagnosis not present

## 2018-10-27 DIAGNOSIS — L02412 Cutaneous abscess of left axilla: Secondary | ICD-10-CM

## 2018-10-27 MED ORDER — HYDROCODONE-ACETAMINOPHEN 7.5-325 MG PO TABS: 1.0000 | ORAL_TABLET | Freq: Three times a day (TID) | ORAL | 0 refills | Status: DC

## 2018-10-27 MED ORDER — OMEPRAZOLE 40 MG PO CPDR: 40.0000 mg | DELAYED_RELEASE_CAPSULE | Freq: Every day | ORAL | 3 refills | Status: DC

## 2018-10-27 MED ORDER — DULOXETINE HCL 30 MG PO CPEP: ORAL_CAPSULE | ORAL | 1 refills | Status: DC

## 2018-10-27 NOTE — Assessment & Plan Note (Signed)
Is due for refill on her pain medication on Monday and she asked that we go ahead and send that today so she does not have to call back.  I did check the [facility] and everything looks appropriate.  Medication refilled.

## 2018-10-27 NOTE — Progress Notes (Signed)
Acute Office Visit  Subjective:    Patient ID: Tracy Massey, female    DOB: 03/24/1951, 68 y.o.   MRN: [id number]  Chief Complaint  Patient presents with  . Abscess    under L axilla  . Laryngitis    HPI Patient is in today for palpable knots under the left axilla.  She says 1 of them started on Saturday and the other one showed up on Sunday both under the left axilla.  She said she had a similar abscess back in the 1990s but has not had any problems since then.  She says they have not drained are just really sore and tender.  No fever sweats or chills.  She denies any changes been deodorants etc. Etc.  She also reports her last several months that she has had some laryngitis coming and going.  She does have a history of reflux but does not take anything daily for it.  No sore throat or upper respiratory symptoms.  Past Medical History:  Diagnosis Date  . Asthma   . Diverticulosis   . Hyperlipidemia   . Hypertension   . Menopausal syndrome   . Neck pain, chronic     Past Surgical History:  Procedure Laterality Date  . ABDOMINAL HYSTERECTOMY  1986   TAH w/o BSO/endometriosis  . APPENDECTOMY  1983  . CHOLECYSTECTOMY  1995   lap  . HAND SURGERY    . HEEL SPUR SURGERY  1996   LT foot  . KNEE ARTHROSCOPY  1983   RT knee and LT knee 1986, 1990  . TUBAL LIGATION     bilateral    Family History  Problem Relation Age of Onset  . Hypertension Mother   . Clotting disorder Mother   . Cancer Father [id number]       throat/died  . Hypertension Daughter   . Emphysema Paternal Grandfather   . Hypertension Brother     Social History   Socioeconomic History  . Marital status: Married    Spouse name: Not on file  . Number of children: Not on file  . Years of education: Not on file  . Highest education level: Not on file  Occupational History  . Not on file  Social Needs  . Financial resource strain: Not on file  . Food insecurity    Worry: Not on file    Inability:  Not on file  . Transportation needs    Medical: Not on file    Non-medical: Not on file  Tobacco Use  . Smoking status: Never Smoker  . Smokeless tobacco: Never Used  Substance and Sexual Activity  . Alcohol use: Yes    Comment: rare  . Drug use: No  . 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  Lifestyle  . Physical activity    Days per week: Not on file    Minutes per session: Not on file  . Stress: Not on file  Relationships  . Social Musicianconnections    Talks on phone: Not on file    Gets together: Not on file    Attends religious service: Not on file    Active member of club or organization: Not on file    Attends meetings of clubs or organizations: Not on file    Relationship status: Not on file  . Intimate partner violence    Fear of current or ex partner: Not  on file    Emotionally abused: Not on file    Physically abused: Not on file    Forced sexual activity: Not on file  Other Topics Concern  . Not on file  Social History Narrative  . Not on file    Outpatient Medications Prior to Visit  Medication Sig Dispense Refill  . amLODipine (NORVASC) 5 MG tablet Take 1 tablet (5 mg total) by mouth daily. 90 tablet 1  . aspirin EC 81 MG tablet Take 81 mg by mouth daily.    Marland Kitchen. atorvastatin (LIPITOR) 10 MG tablet Take 1 tablet (10 mg total) by mouth at bedtime. 90 tablet 3  . buPROPion (WELLBUTRIN XL) 300 MG 24 hr tablet Take 1 tablet (300 mg total) by mouth daily. 90 tablet 1  . lisinopril (PRINIVIL,ZESTRIL) 20 MG tablet Take 1 tablet (20 mg total) by mouth daily. 90 tablet 1  . metFORMIN (GLUCOPHAGE) 500 MG tablet Take 1 tablet (500 mg total) by mouth 2 (two) times daily with a meal. 60 tablet 3  . metoprolol succinate (TOPROL-XL) 25 MG 24 hr tablet Take 1 tablet (25 mg total) by mouth daily. 90 tablet 1  . DULoxetine (CYMBALTA) 30 MG capsule TAKE ONE CAPSULE BY MOUTH EVERY DAY.  START WHEN COMPLETE THE VENLAFAXINE. 30 capsule 1  . HYDROcodone-acetaminophen (NORCO) 7.5-325 MG tablet Take 1 tablet by mouth every 8 (eight) hours. 90 tablet 0  . zolpidem (AMBIEN) 10 MG tablet Take 0.5 tablets (5 mg total) by mouth once for 1 dose. 45 tablet 1   No facility-administered medications prior to visit.     Allergies  Allergen Reactions  . Iodinated Diagnostic Agents Anaphylaxis  . Other Anaphylaxis    Prescoline  . Latex Rash  . Penicillins Rash    ROS     Objective:    Physical Exam  Constitutional: She is oriented to person, place, and time. She appears well-developed and well-nourished.  HENT:  Head: Normocephalic and atraumatic.  Eyes: Conjunctivae and EOM are normal.  Cardiovascular: Normal rate.  Pulmonary/Chest: Effort normal.  Neurological: She is alert and oriented to person, place, and time.  Skin: Skin is dry. No pallor.  In the left axilla she has a pink raised pustule that is approximately 5 mm in size and underneath that has a nodular area that is approximately 4 cm in size.  In the axilla closer to the torso she has a second lesion that is a little bit smaller maybe 2 cm in size also again with a pustule on the surface.  Psychiatric: She has a normal mood and affect. Her behavior is normal.  Vitals reviewed.   BP (!) 154/61   Pulse 72   Ht 5\' 3"  (1.6 m)   Wt 178 lb (80.7 kg)   SpO2 99%   BMI 31.53 kg/m  Wt Readings from Last 3 Encounters:  10/27/18 178 lb (80.7 kg)  10/13/18 184 lb (83.5 kg)  10/13/18 184 lb (83.5 kg)    There are no preventive care reminders to display for this patient.  There are no preventive care reminders to display for this patient.   Lab Results  Component Value Date   TSH 4.310 06/01/2011   Lab Results  Component Value Date   WBC 5.7 10/13/2018   HGB 13.7 10/13/2018   HCT 41.7 10/13/2018   MCV 92.5 10/13/2018   PLT 277 10/13/2018   Lab Results  Component Value Date   NA 143 07/13/2018   K 3.8  07/13/2018  CO2 28 07/13/2018   GLUCOSE 77 07/13/2018   BUN 15 07/13/2018   CREATININE 1.52 (H) 10/13/2018   BILITOT 0.5 07/13/2018   ALKPHOS 85 06/01/2011   AST 18 07/13/2018   ALT 12 07/13/2018   PROT 6.6 10/13/2018   ALBUMIN 4.1 06/01/2011   CALCIUM 9.9 10/13/2018   Lab Results  Component Value Date   CHOL 174 07/13/2018   Lab Results  Component Value Date   HDL 55 07/13/2018   Lab Results  Component Value Date   LDLCALC 98 07/13/2018   Lab Results  Component Value Date   TRIG 113 07/13/2018   Lab Results  Component Value Date   CHOLHDL 3.2 07/13/2018   Lab Results  Component Value Date   HGBA1C 5.8 (A) 07/13/2018       Assessment & Plan:   Problem List Items Addressed This Visit      Other   Encounter for chronic pain management    Is due for refill on her pain medication on Monday and she asked that we go ahead and send that today so she does not have to call back.  I did check the [facility] and everything looks appropriate.  Medication refilled.      Relevant Medications   HYDROcodone-acetaminophen (NORCO) 7.5-325 MG tablet    Other Visit Diagnoses    Abscess of left axilla    -  Primary   Relevant Orders   Wound culture   Laryngitis        He has 2 abscesses of the left axilla which were drained.  They were both probed with sterile applicator for any tunneling or tracts.  She had a significant amount of discharge from the distal abscess compared to the proximal 1.  Follow-up wound care discussed.  Call if any signs of infection.  We did discuss holding off on antibiotics unless she notices any worsening symptoms or erythema.  Laryngitis-suspect related to reflux.  Especially since it comes and goes and is not persistent.  Discussed a 6-week trial of a PPI to see if this improves her symptoms and if not she will let us know and we can consider ENT referral at that time for further evaluation possible polyps etc. of the vocal  cords.  Incision and Drainage Procedure Note  Pre-operative Diagnosis: 2 abscess of the left axilla  Post-operative Diagnosis: same  Indications: Pain and infection  Anesthesia: 1% lidocaine with epinephrine  Procedure Details  The procedure, risks and complications have been discussed in detail (including, but not limited to airway compromise, infection, bleeding) with the patient, and the patient has signed consent to the procedure.  The skin was sterilely prepped and draped over the affected area in the usual fashion. After adequate local anesthesia, I&D with a #11 blade was performed on the left axilla. Purulent drainage: present.  Both abscesses were probed with a sterile applicator to determine if there were any tracts or other pockets of infection.  Had a very large amount of discharge from the more distal axillary lesion.  Quarter inch iodoform gauze placed in both wounds and covered with a dressing. The patient was observed until stable.  Findings: abscess  EBL: trace blood  Drains: iodoform gauze placed.    Condition: Tolerated procedure well   Complications: none.    Meds ordered this encounter  Medications  . omeprazole (PRILOSEC) 40 MG capsule    Sig: Take 1 capsule (40 mg total) by mouth daily.    Dispense:  30 capsule    Refill:  3  . DULoxetine (CYMBALTA) 30 MG capsule    Sig: TAKE ONE CAPSULE BY MOUTH EVERY DAY.    Dispense:  30 capsule    Refill:  1  . HYDROcodone-acetaminophen (NORCO) 7.5-325 MG tablet    Sig: Take 1 tablet by mouth every 8 (eight) hours.    Dispense:  90 tablet    Refill:  0     Nani Gasseratherine Elvert Cumpton, MD

## 2018-10-27 NOTE — Patient Instructions (Signed)
Keep covered for 24 hours.  Tomorrow pull the gauze out by about 1 inch and trim to leave a tail.  Repeat daily until full gauze is out.   Call if any sign of infection.   We will call with the culture result next week.   Can try the omeprazole daily for 6 weeks to see if your voice is better.

## 2018-10-30 LAB — WOUND CULTURE
MICRO NUMBER:: 720751
SPECIMEN QUALITY:: ADEQUATE

## 2018-11-01 ENCOUNTER — Ambulatory Visit: Payer: Medicare PPO | Admitting: Physician Assistant

## 2018-11-02 ENCOUNTER — Ambulatory Visit (INDEPENDENT_AMBULATORY_CARE_PROVIDER_SITE_OTHER): Payer: Medicare PPO | Admitting: Physician Assistant

## 2018-11-02 ENCOUNTER — Encounter: Payer: Self-pay | Admitting: Physician Assistant

## 2018-11-02 ENCOUNTER — Other Ambulatory Visit: Payer: Self-pay

## 2018-11-02 VITALS — BP 142/81 | HR 58 | Temp 98.2°F | Wt 177.0 lb

## 2018-11-02 DIAGNOSIS — L02412 Cutaneous abscess of left axilla: Secondary | ICD-10-CM | POA: Diagnosis not present

## 2018-11-02 MED ORDER — CLINDAMYCIN HCL 300 MG PO CAPS: 300.0000 mg | ORAL_CAPSULE | Freq: Three times a day (TID) | ORAL | 0 refills | Status: AC

## 2018-11-02 NOTE — Progress Notes (Signed)
HPI:                                                                Tracy Massey is a 68 y.o. female who [facility] today for abscess of left axilla  Patient was treated by her PCP for 2 abscesses of left axilla on 10/27/18 with I&D and Keflex. Reports this abscess is still actively draining. Reports allergic reaction of hand swelling and redness with Keflex Wound culture grew Staph aureus negative for inducible Clindamycin resistance; resistant to fluoroquinolones and Erythromycin.  Reports a new abscess forming in the same armpit a few days ago. Similar to prior abscess. Denies fever, chills, streaking redness, malaise.  Past Medical History:  Diagnosis Date  . Asthma   . Diverticulosis   . Hyperlipidemia   . Hypertension   . Menopausal syndrome   . Neck pain, chronic    Past Surgical History:  Procedure Laterality Date  . ABDOMINAL HYSTERECTOMY  1986   TAH w/o BSO/endometriosis  . APPENDECTOMY  1983  . CHOLECYSTECTOMY  1995   lap  . HAND SURGERY    . HEEL SPUR SURGERY  1996   LT foot  . KNEE ARTHROSCOPY  1983   RT knee and LT knee 1986, 1990  . TUBAL LIGATION     bilateral   Social History   Tobacco Use  . Smoking status: Never Smoker  . Smokeless tobacco: Never Used  Substance Use Topics  . Alcohol use: Yes    Comment: rare   family history includes Cancer (age of onset: 9980) in her father; Clotting disorder in her mother; Emphysema in her paternal grandfather; Hypertension in her brother, daughter, and mother.    ROS: negative except as noted in the HPI  Medications: Current Outpatient Medications  Medication Sig Dispense Refill  . amLODipine (NORVASC) 5 MG tablet Take 1 tablet (5 mg total) by mouth daily. 90 tablet 1  . aspirin EC 81 MG tablet Take 81 mg by mouth daily.    Marland Kitchen. atorvastatin (LIPITOR) 10 MG tablet Take 1 tablet (10 mg total) by mouth at bedtime. 90 tablet 3  . buPROPion  (WELLBUTRIN XL) 300 MG 24 hr tablet Take 1 tablet (300 mg total) by mouth daily. 90 tablet 1  . DULoxetine (CYMBALTA) 30 MG capsule TAKE ONE CAPSULE BY MOUTH EVERY DAY. 30 capsule 1  . HYDROcodone-acetaminophen (NORCO) 7.5-325 MG tablet Take 1 tablet by mouth every 8 (eight) hours. 90 tablet 0  . lisinopril (PRINIVIL,ZESTRIL) 20 MG tablet Take 1 tablet (20 mg total) by mouth daily. 90 tablet 1  . metFORMIN (GLUCOPHAGE) 500 MG tablet Take 1 tablet (500 mg total) by mouth 2 (two) times daily with a meal. 60 tablet 3  . metoprolol succinate (TOPROL-XL) 25 MG 24 hr tablet Take 1 tablet (25 mg total) by mouth daily. 90 tablet 1  . omeprazole (PRILOSEC) 40 MG capsule Take 1 capsule (40 mg total) by mouth daily. 30 capsule 3  . zolpidem (AMBIEN) 10 MG tablet Take 0.5 tablets (5 mg total) by mouth once for 1 dose. 45 tablet 1   No current facility-administered medications for this visit.    Allergies  Allergen Reactions  . Iodinated Diagnostic Agents Anaphylaxis  .  Other Anaphylaxis    Prescoline  . Keflex [Cephalexin] Itching  . Latex Rash  . Penicillins Rash       Objective:  BP (!) 142/81   Pulse (!) 58   Temp 98.2 F (36.8 C) (Oral)   Wt 177 lb (80.3 kg)   BMI 31.35 kg/m  Physical Exam Vitals signs reviewed. Exam conducted with a chaperone present.  Constitutional:      Appearance: Normal appearance. She is obese. She is not ill-appearing.  Skin:      Neurological:     Mental Status: She is alert.      Recent Results (from the past 2160 hour(s))  CBC     Status: None   Collection Time: 10/13/18 10:13 AM  Result Value Ref Range   WBC 5.7 3.8 - 10.8 Thousand/uL   RBC 4.51 3.80 - 5.10 Million/uL   Hemoglobin 13.7 11.7 - 15.5 g/dL   HCT 16.141.7 09.635.0 - 04.545.0 %   MCV 92.5 80.0 - 100.0 fL   MCH 30.4 27.0 - 33.0 pg   MCHC 32.9 32.0 - 36.0 g/dL   RDW 40.912.7 81.111.0 - 91.415.0 %   Platelets 277 140 - 400 Thousand/uL   MPV 10.2 7.5 - 12.5 fL  Sedimentation rate     Status: None    Collection Time: 10/13/18 10:13 AM  Result Value Ref Range   Sed Rate CANCELED     Comment: TEST NOT PERFORMED . Quantity not sufficient.  Result canceled by the ancillary.   Protein electrophoresis, serum     Status: None   Collection Time: 10/13/18 10:13 AM  Result Value Ref Range   Total Protein 6.6 6.1 - 8.1 g/dL   Albumin ELP 3.9 3.8 - 4.8 g/dL   Alpha 1 0.3 0.2 - 0.3 g/dL   Alpha 2 0.8 0.5 - 0.9 g/dL   Beta Globulin 0.4 0.4 - 0.6 g/dL   Beta 2 0.4 0.2 - 0.5 g/dL   Gamma Globulin 0.8 0.8 - 1.7 g/dL   SPE Interp.      Comment: Normal Serum Protein Electrophoresis Pattern. No abnormal protein bands (M-protein) detected.   Urinalysis, microscopic only     Status: Abnormal   Collection Time: 10/13/18 10:13 AM  Result Value Ref Range   WBC, UA 0-5 0 - 5 /HPF   RBC / HPF NONE SEEN 0 - 2 /HPF   Squamous Epithelial / LPF 0-5 < OR = 5 /HPF   Bacteria, UA NONE SEEN NONE SEEN /HPF   Calcium Oxalate Crystal MODERATE (A) NONE OR FE /HPF   Hyaline Cast NONE SEEN NONE SEEN /LPF  Phosphorus     Status: Abnormal   Collection Time: 10/13/18 10:13 AM  Result Value Ref Range   Phosphorus 4.4 (H) 2.1 - 4.3 mg/dL  PTH, Intact and Calcium     Status: None   Collection Time: 10/13/18 10:13 AM  Result Value Ref Range   PTH 41 14 - 64 pg/mL    Comment: . Interpretive Guide    Intact PTH           Calcium ------------------    ----------           ------- Normal Parathyroid    Normal               Normal Hypoparathyroidism    Low or Low Normal    Low Hyperparathyroidism    Primary            Normal or High  High    Secondary          High                 Normal or Low    Tertiary           High                 High Non-Parathyroid    Hypercalcemia      Low or Low Normal    High .    Calcium 9.9 8.6 - 10.4 mg/dL  C3 and C4     Status: None   Collection Time: 10/13/18 10:13 AM  Result Value Ref Range   C3 Complement 141 83 - 193 mg/dL   C4 Complement 44 15 - 57 mg/dL  Protein  Electrophoresis,Random Urn     Status: Abnormal   Collection Time: 10/13/18 10:13 AM  Result Value Ref Range   Creatinine, Urine 217 20 - 275 mg/dL   Protein/Creat Ratio 120 21 - 161 mg/g creat   Protein/Creatinine Ratio 0.120 0.021 - 0.16 mg/mg creat   Total Protein, Urine 26 (H) 5 - 24 mg/dL   Albumin 39 %   Alpha-1-Globulin, U 4 %   Alpha-2-Globulin, U 13 %   Beta Globulin, U 28 %   Gamma Globulin, U 15 %   Abnormal Protein Band NOTE NONE DETEC mg/dL   Interpretation      Comment: . Possible abnormal protein band detected in the beta globulins. This band may represent beta-2 microglobulin, monoclonal immunoglobulin, or free light chain (Bence-Jones protein). Immunofixation analysis is available if clinically indicated. .   Creatinine with Est GFR     Status: Abnormal   Collection Time: 10/13/18 10:13 AM  Result Value Ref Range   Creat 1.52 (H) 0.50 - 0.99 mg/dL    Comment: For patients >24 years of age, the reference limit for Creatinine is approximately 13% higher for people identified as African-American. .    GFR, Est Non African American 35 (L) > OR = 60 mL/min/1.29m2   GFR, Est African American 40 (L) > OR = 60 mL/min/1.2m2  Wound culture     Status: Abnormal   Collection Time: 10/27/18 11:49 AM   Specimen: Abscess; Wound  Result Value Ref Range   MICRO NUMBER: [id number]    SPECIMEN QUALITY: Adequate    SOURCE: ABSCESS    STATUS: FINAL    GRAM STAIN:      No white blood cells seen No epithelial cells seen Few Gram positive cocci in pairs   ISOLATE 1: Staphylococcus aureus (A)     Comment: Heavy growth of Staphylococcus aureus Negative for inducible clindamycin resistance.      Susceptibility   Staphylococcus aureus - AEROBIC CULT, GRAM STAIN POSITIVE 1    VANCOMYCIN 1 Sensitive     CIPROFLOXACIN >=8 Resistant     CLINDAMYCIN <=0.25 Sensitive     LEVOFLOXACIN 4 Resistant     ERYTHROMYCIN >=8 Resistant     GENTAMICIN <=0.5 Sensitive     OXACILLIN* <=0.25  Sensitive      * Oxacillin-susceptible staphylococci aresusceptible to other penicillinase-stablepenicillins (e.g. Methicillin, Nafcillin), beta-lactam/beta-lactamase inhibitor combinations, andcephems with staphylococcal indications, includingCefazolin.    TETRACYCLINE <=1 Sensitive     TRIMETH/SULFA* <=10 Sensitive      * Oxacillin-susceptible staphylococci aresusceptible to other penicillinase-stablepenicillins (e.g. Methicillin, Nafcillin), beta-lactam/beta-lactamase inhibitor combinations, andcephems with staphylococcal indications, includingCefazolin.Legend:S = Susceptible  I = IntermediateR = Resistant  NS = Not susceptible* = Not tested  NR = Not reported**NN = See antimicrobic comments    Procedure:  Incision and drainage of abscess. Risks, benefits, and alternatives explained and verbal consent obtained. Surface cleaned with chlorhexidine. 3 cc lidocaine 1% with epinephine infiltrated around abscess. Adequate anesthesia ensured. Area prepped and draped in a sterile fashion. #11 blade used to make a stab incision into abscess. Pus expressed with pressure. Curved hemostat used to explore 4 quadrants and loculations broken up. Further purulence expressed. Hemostasis achieved. Patient tolerated procedure without immediate complications.    Assessment and Plan: 68 y.o. female with   .Tracy Massey was seen today for cyst.  Diagnoses and all orders for this visit:  Cutaneous abscess of left axilla -     clindamycin (CLEOCIN) 300 MG capsule; Take 1 capsule (300 mg total) by mouth 3 (three) times daily for 5 days.   I&D performed in office today (see procedure note) Reviewed recent wound culture from adjacent abscess in the same area Keflex added to allergy list Start Clindamycin tid x 5 days    Patient education and anticipatory guidance given Patient agrees with treatment plan Follow-up in 2 days or sooner as needed if symptoms worsen or fail to improve  Levonne Hubertharley E.   PA-C

## 2018-11-02 NOTE — Patient Instructions (Signed)
Incision and Drainage, Care After  This sheet gives you information about how to care for yourself after your procedure. Your health care provider may also give you more specific instructions. If you have problems or questions, contact your health care provider.  What can I expect after the procedure?  After the procedure, it is common to have:  · Pain or discomfort around the incision site.  · Blood, fluid, or pus (drainage) from the incision.  · Redness and firm skin around the incision site.  Follow these instructions at home:  Medicines  · Take over-the-counter and prescription medicines only as told by your health care provider.  · If you were prescribed an antibiotic medicine, use or take it as told by your health care provider. Do not stop using the antibiotic even if you start to feel better.  Wound care    Follow instructions from your health care provider about how to take care of your wound. Make sure you:  · Wash your hands with soap and water before and after you change your bandage (dressing). If soap and water are not available, use hand sanitizer.  · Change your dressing and packing as told by your health care provider.  ? If your dressing is dry or stuck when you try to remove it, moisten or wet the dressing with saline or water so that it can be removed without harming your skin or tissues.  ? If your wound is packed, leave it in place until your health care provider tells you to remove it. To remove the packing, moisten or wet the packing with saline or water so that it can be removed without harming your skin or tissues.  · Leave stitches (sutures), skin glue, or adhesive strips in place. These skin closures may need to stay in place for 2 weeks or longer. If adhesive strip edges start to loosen and curl up, you may trim the loose edges. Do not remove adhesive strips completely unless your health care provider tells you to do that.  Check your wound every day for signs of infection. Check  for:  · More redness, swelling, or pain.  · More fluid or blood.  · Warmth.  · Pus or a bad smell.  If you were sent home with a drain tube in place, follow instructions from your health care provider about:  · How to empty it.  · How to care for it at home.     General instructions  · Rest the affected area.  · Do not take baths, swim, or use a hot tub until your health care provider approves. Ask your health care provider if you may take showers. You may only be allowed to take sponge baths.  · Return to your normal activities as told by your health care provider. Ask your health care provider what activities are safe for you. Your health care provider may put you on activity or lifting restrictions.  · The incision will continue to drain. It is normal to have some clear or slightly bloody drainage. The amount of drainage should lessen each day.  · Do not apply any creams, ointments, or liquids unless you have been told to by your health care provider.  · Keep all follow-up visits as told by your health care provider. This is important.  Contact a health care provider if:  · Your cyst or abscess returns.  · You have a fever or chills.  · You have more redness, swelling, or pain   around your incision.  · You have more fluid or blood coming from your incision.  · Your incision feels warm to the touch.  · You have pus or a bad smell coming from your incision.  · You have red streaks above or below the incision site.  Get help right away if:  · You have severe pain or bleeding.  · You cannot eat or drink without vomiting.  · You have decreased urine output.  · You become short of breath.  · You have chest pain.  · You cough up blood.  · The affected area becomes numb or starts to tingle.  These symptoms may represent a serious problem that is an emergency. Do not wait to see if the symptoms will go away. Get medical help right away. Call your local emergency services (911 in the [place] Do not drive yourself to the  hospital.  Summary  · After this procedure, it is common to have fluid, blood, or pus coming from the surgery site.  · Follow all home care instructions. You will be told how to take care of your incision, how to check for infection, and how to take medicines.  · If you were prescribed an antibiotic medicine, take it as told by your health care provider. Do not stop taking the antibiotic even if you start to feel better.  · Contact a health care provider if you have increased redness, swelling, or pain around your incision. Get help right away if you have chest pain, you vomit, you cough up blood, or you have shortness of breath.  · Keep all follow-up visits as told by your health care provider. This is important.  This information is not intended to replace advice given to you by your health care provider. Make sure you discuss any questions you have with your health care provider.  Document Released: 06/08/2011 Document Revised: 02/14/2018 Document Reviewed: 02/14/2018  Elsevier Patient Education © 2020 [employer] Inc.   

## 2018-11-04 ENCOUNTER — Other Ambulatory Visit: Payer: Self-pay

## 2018-11-04 ENCOUNTER — Ambulatory Visit (INDEPENDENT_AMBULATORY_CARE_PROVIDER_SITE_OTHER): Payer: Medicare PPO | Admitting: Physician Assistant

## 2018-11-04 ENCOUNTER — Encounter: Payer: Self-pay | Admitting: Physician Assistant

## 2018-11-04 VITALS — BP 143/81 | HR 79 | Temp 98.0°F

## 2018-11-04 DIAGNOSIS — L02412 Cutaneous abscess of left axilla: Secondary | ICD-10-CM

## 2018-11-04 NOTE — Progress Notes (Signed)
HPI:                                                                Pilar Grammesleanore M Pointer is a 68 y.o. female [facility] today for I&D follow-up  11/04/18 Doing well. No fever, chills, malaise, streaking redness or significant pain She is concerned that she has another abscess forming in the left axilla.  11/02/18 Patient was treated by her PCP for 2 abscesses of left axilla on 10/27/18 with I&D and Keflex. Reports this abscess is still actively draining. Reports allergic reaction of hand swelling and redness with Keflex Wound culture grew Staph aureus negative for inducible Clindamycin resistance; resistant to fluoroquinolones and Erythromycin.  Reports a new abscess forming in the same armpit a few days ago. Similar to prior abscess. Denies fever, chills, streaking redness, malaise. Past Medical History:  Diagnosis Date  . Asthma   . Diverticulosis   . Hyperlipidemia   . Hypertension   . Menopausal syndrome   . Neck pain, chronic    Past Surgical History:  Procedure Laterality Date  . ABDOMINAL HYSTERECTOMY  1986   TAH w/o BSO/endometriosis  . APPENDECTOMY  1983  . CHOLECYSTECTOMY  1995   lap  . HAND SURGERY    . HEEL SPUR SURGERY  1996   LT foot  . KNEE ARTHROSCOPY  1983   RT knee and LT knee 1986, 1990  . TUBAL LIGATION     bilateral   Social History   Tobacco Use  . Smoking status: Never Smoker  . Smokeless tobacco: Never Used  Substance Use Topics  . Alcohol use: Yes    Comment: rare   family history includes Cancer (age of onset: 1080) in her father; Clotting disorder in her mother; Emphysema in her paternal grandfather; Hypertension in her brother, daughter, and mother.    ROS: negative except as noted in the HPI  Medications: Current Outpatient Medications  Medication Sig Dispense Refill  . amLODipine (NORVASC) 5 MG tablet Take 1 tablet (5 mg total) by mouth daily. 90 tablet 1  . aspirin  EC 81 MG tablet Take 81 mg by mouth daily.    Marland Kitchen. atorvastatin (LIPITOR) 10 MG tablet Take 1 tablet (10 mg total) by mouth at bedtime. 90 tablet 3  . buPROPion (WELLBUTRIN XL) 300 MG 24 hr tablet Take 1 tablet (300 mg total) by mouth daily. 90 tablet 1  . clindamycin (CLEOCIN) 300 MG capsule Take 1 capsule (300 mg total) by mouth 3 (three) times daily for 5 days. 15 capsule 0  . DULoxetine (CYMBALTA) 30 MG capsule TAKE ONE CAPSULE BY MOUTH EVERY DAY. 30 capsule 1  . HYDROcodone-acetaminophen (NORCO) 7.5-325 MG tablet Take 1 tablet by mouth every 8 (eight) hours. 90 tablet 0  . lisinopril (PRINIVIL,ZESTRIL) 20 MG tablet Take 1 tablet (20 mg total) by mouth daily. 90 tablet 1  . metFORMIN (GLUCOPHAGE) 500 MG tablet Take 1 tablet (500 mg total) by mouth 2 (two) times daily with a meal. 60 tablet 3  . metoprolol succinate (TOPROL-XL) 25 MG 24 hr tablet Take 1 tablet (25 mg total) by mouth daily. 90 tablet 1  . omeprazole (PRILOSEC) 40 MG capsule Take 1 capsule (40 mg total) by mouth  daily. 30 capsule 3  . zolpidem (AMBIEN) 10 MG tablet Take 0.5 tablets (5 mg total) by mouth once for 1 dose. 45 tablet 1   No current facility-administered medications for this visit.    Allergies  Allergen Reactions  . Iodinated Diagnostic Agents Anaphylaxis  . Other Anaphylaxis    Prescoline  . Keflex [Cephalexin] Swelling    Hand swelling/redness  . Sulfa Antibiotics Nausea And Vomiting  . Latex Rash  . Penicillins Rash       Objective:  BP (!) 143/81   Pulse 79   Temp 98 F (36.7 C) (Oral)  Physical Exam Constitutional:      Appearance: Normal appearance. She is obese.  HENT:     Head: Normocephalic and atraumatic.  Pulmonary:     Effort: Pulmonary effort is normal.  Skin:    General: Skin is warm and dry.     Findings: Ecchymosis (adjacent to I&D site) present.       Neurological:     Mental Status: She is alert and oriented to person, place, and time.  Psychiatric:        Behavior:  Behavior normal.        Thought Content: Thought content normal.       No results found for this or any previous visit (from the past 72 hour(s)). No results found.    Assessment and Plan: 68 y.o. female with   .Tracina was seen today for follow-up.  Diagnoses and all orders for this visit:  Cutaneous abscess of left axilla   Interval improvement in abscess There is residual mild cellulitis but I do not appreciate a fluid collection on exam today Cont Clindamycin Instructed to throw away deodorant and avoid using deodorant or shaving until completion of antibiotics and resolution of symptoms  Patient education and anticipatory guidance given Patient agrees with treatment plan Follow-up as needed if symptoms worsen or fail to improve  Darlyne Russian PA-C

## 2018-11-07 ENCOUNTER — Ambulatory Visit: Payer: Medicare PPO | Admitting: Physician Assistant

## 2018-11-08 ENCOUNTER — Other Ambulatory Visit: Payer: Self-pay

## 2018-11-08 MED ORDER — DULOXETINE HCL 30 MG PO CPEP: ORAL_CAPSULE | ORAL | 1 refills | Status: DC

## 2018-11-14 ENCOUNTER — Telehealth: Payer: Self-pay | Admitting: Family Medicine

## 2018-11-14 NOTE — Telephone Encounter (Signed)
Received fax from [facility] that Cymbalta was approved from 11/14/2018 through 03/30/2019. Pharmacy notified and form sent to scan.

## 2018-11-14 NOTE — Telephone Encounter (Signed)
Received fax from [employer] that Cymbalta requires a PA. Information has been sent to the insurance company. Awaiting determination.   

## 2018-11-29 ENCOUNTER — Other Ambulatory Visit: Payer: Self-pay | Admitting: Family Medicine

## 2018-11-29 DIAGNOSIS — G8929 Other chronic pain: Secondary | ICD-10-CM

## 2018-11-29 NOTE — Telephone Encounter (Signed)
Patient called and wants a refill of her hydrocodone. Last refill was 10/27/2018 to [facility]. Please advise.

## 2018-11-30 MED ORDER — HYDROCODONE-ACETAMINOPHEN 7.5-325 MG PO TABS: 1.0000 | ORAL_TABLET | Freq: Three times a day (TID) | ORAL | 0 refills | Status: DC

## 2018-12-14 ENCOUNTER — Telehealth (INDEPENDENT_AMBULATORY_CARE_PROVIDER_SITE_OTHER): Payer: Medicare PPO | Admitting: Family Medicine

## 2018-12-14 ENCOUNTER — Encounter: Payer: Self-pay | Admitting: Family Medicine

## 2018-12-14 VITALS — BP 148/80 | HR 85 | Temp 97.9°F | Wt 177.6 lb

## 2018-12-14 DIAGNOSIS — I739 Peripheral vascular disease, unspecified: Secondary | ICD-10-CM | POA: Diagnosis not present

## 2018-12-14 DIAGNOSIS — F4321 Adjustment disorder with depressed mood: Secondary | ICD-10-CM

## 2018-12-14 DIAGNOSIS — F5101 Primary insomnia: Secondary | ICD-10-CM

## 2018-12-14 DIAGNOSIS — I10 Essential (primary) hypertension: Secondary | ICD-10-CM

## 2018-12-14 MED ORDER — LISINOPRIL 40 MG PO TABS: 40.0000 mg | ORAL_TABLET | Freq: Every day | ORAL | 1 refills | Status: DC

## 2018-12-14 NOTE — Assessment & Plan Note (Signed)
She is now back on Plavix because of an ischemic toe.  Will hopefully get notes from the vascular surgeon.  Not sure if at this point they want her to be on Plavix for the rest of her life or if just for short period of time.  She did mention that eventually he would want me to take over this prescription which is perfectly fine.

## 2018-12-14 NOTE — Progress Notes (Signed)
Virtual Visit via Video Note  I connected with Tracy Massey on 12/14/18 at  9:50 AM EDT by a video enabled telemedicine application and verified that I am speaking with the correct person using two identifiers.   I discussed the limitations of evaluation and management by telemedicine and the availability of in person appointments. The patient expressed understanding and agreed to proceed.     Established Patient Office Visit  Subjective:  Patient ID: Tracy Massey, female    DOB: 29-Dec-1950  Age: 68 y.o. MRN: [id number]  CC:  Chief Complaint  Patient presents with  . mood    she reports that she has no energy and stated that she feels more depressed than she was at her last OV    HPI Tracy Massey presents for worsening depression.  She still grieving for the loss of her husband and has run into some frustrating things.  He had what she thought was a life Brewing technologistinsurance policy but it was really only an accidental policy so she was unable to collect any money from that.  She also found out recently that she was not eligible for his retirement even though legally she should be so she is actually looking into possibly hiring an attorney. She feels fatigued.    She says she has been talking to a counselor some at her daughter's work and that has been helpful.  She still taking her Wellbutrin and Cymbalta.  He also wanted to let me know that she is restarted Plavix.  She was noticing her left foot was turning white and the toes were turning blue so she went back to see her vascular surgeon they did a scan he was concerned that a piece of plaque may have broken off and he has restarted her Plavix.  He filled it initially but wants me to take over for further prescriptions.  Past Medical History:  Diagnosis Date  . Asthma   . Diverticulosis   . Hyperlipidemia   . Hypertension   . Menopausal syndrome   . Neck pain, chronic     Past Surgical History:  Procedure Laterality  Date  . ABDOMINAL HYSTERECTOMY  1986   TAH w/o BSO/endometriosis  . APPENDECTOMY  1983  . CHOLECYSTECTOMY  1995   lap  . HAND SURGERY    . HEEL SPUR SURGERY  1996   LT foot  . KNEE ARTHROSCOPY  1983   RT knee and LT knee 1986, 1990  . TUBAL LIGATION     bilateral    Family History  Problem Relation Age of Onset  . Hypertension Mother   . Clotting disorder Mother   . Cancer Father 1280       throat/died  . Hypertension Daughter   . Emphysema Paternal Grandfather   . Hypertension Brother     Social History   Socioeconomic History  . Marital status: Married    Spouse name: Not on file  . Number of children: Not on file  . Years of education: Not on file  . Highest education level: Not on file  Occupational History  . Not on file  Social Needs  . Financial resource strain: Not on file  . Food insecurity    Worry: Not on file    Inability: Not on file  . Transportation needs    Medical: Not on file    Non-medical: Not on file  Tobacco Use  . Smoking status: Never Smoker  . Smokeless tobacco: Never Used  Substance and Sexual Activity  . Alcohol use: Yes    Comment: rare  . Drug use: No  . 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  Lifestyle  . Physical activity    Days per week: Not on file    Minutes per session: Not on file  . Stress: Not on file  Relationships  . Social Musician on phone: Not on file    Gets together: Not on file    Attends religious service: Not on file    Active member of club or organization: Not on file    Attends meetings of clubs or organizations: Not on file    Relationship status: Not on file  . Intimate partner violence    Fear of current or ex partner: Not on file    Emotionally abused: Not on file    Physically abused: Not on file    Forced sexual activity: Not on file  Other Topics Concern  . Not on file  Social  History Narrative  . Not on file    Outpatient Medications Prior to Visit  Medication Sig Dispense Refill  . amLODipine (NORVASC) 5 MG tablet Take 1 tablet (5 mg total) by mouth daily. 90 tablet 1  . aspirin EC 81 MG tablet Take 81 mg by mouth daily.    Marland Kitchen atorvastatin (LIPITOR) 10 MG tablet Take 1 tablet (10 mg total) by mouth at bedtime. 90 tablet 3  . buPROPion (WELLBUTRIN XL) 300 MG 24 hr tablet Take 1 tablet (300 mg total) by mouth daily. 90 tablet 1  . clopidogrel (PLAVIX) 75 MG tablet Take 75 mg by mouth daily.    . DULoxetine (CYMBALTA) 30 MG capsule TAKE ONE CAPSULE BY MOUTH EVERY DAY. 90 capsule 1  . HYDROcodone-acetaminophen (NORCO) 7.5-325 MG tablet Take 1 tablet by mouth every 8 (eight) hours. 90 tablet 0  . metoprolol succinate (TOPROL-XL) 25 MG 24 hr tablet Take 1 tablet (25 mg total) by mouth daily. 90 tablet 1  . omeprazole (PRILOSEC) 40 MG capsule Take 1 capsule (40 mg total) by mouth daily. 30 capsule 3  . lisinopril (PRINIVIL,ZESTRIL) 20 MG tablet Take 1 tablet (20 mg total) by mouth daily. 90 tablet 1  . zolpidem (AMBIEN) 10 MG tablet Take 0.5 tablets (5 mg total) by mouth once for 1 dose. 45 tablet 1  . metFORMIN (GLUCOPHAGE) 500 MG tablet Take 1 tablet (500 mg total) by mouth 2 (two) times daily with a meal. 60 tablet 3   No facility-administered medications prior to visit.     Allergies  Allergen Reactions  . Other Anaphylaxis    Prescoline  . Keflex [Cephalexin] Swelling    Hand swelling/redness  . Iodinated Diagnostic Agents     Pt reports that she recently had an MRI and they used dye she didn't have a reaction  . Metformin And Related Nausea And Vomiting  . Latex Rash  . Penicillins Rash  . Sulfa Antibiotics Nausea And Vomiting    ROS Review of Systems    Objective:    Physical Exam  BP (!) 148/80   Pulse 85   Temp 97.9 F (36.6 C)   Wt 177 lb 9.6 oz (80.6 kg)   BMI 31.46 kg/m  Wt Readings from Last 3 Encounters:  12/14/18 177 lb 9.6 oz  (80.6 kg)  11/02/18 177 lb (80.3 kg)  10/27/18 178 lb (80.7  kg)     Health Maintenance Due  Topic Date Due  . INFLUENZA VACCINE  10/29/2018    There are no preventive care reminders to display for this patient.  Lab Results  Component Value Date   TSH 4.310 06/01/2011   Lab Results  Component Value Date   WBC 5.7 10/13/2018   HGB 13.7 10/13/2018   HCT 41.7 10/13/2018   MCV 92.5 10/13/2018   PLT 277 10/13/2018   Lab Results  Component Value Date   NA 143 07/13/2018   K 3.8 07/13/2018   CO2 28 07/13/2018   GLUCOSE 77 07/13/2018   BUN 15 07/13/2018   CREATININE 1.52 (H) 10/13/2018   BILITOT 0.5 07/13/2018   ALKPHOS 85 06/01/2011   AST 18 07/13/2018   ALT 12 07/13/2018   PROT 6.6 10/13/2018   ALBUMIN 4.1 06/01/2011   CALCIUM 9.9 10/13/2018   Lab Results  Component Value Date   CHOL 174 07/13/2018   Lab Results  Component Value Date   HDL 55 07/13/2018   Lab Results  Component Value Date   LDLCALC 98 07/13/2018   Lab Results  Component Value Date   TRIG 113 07/13/2018   Lab Results  Component Value Date   CHOLHDL 3.2 07/13/2018   Lab Results  Component Value Date   HGBA1C 5.8 (A) 07/13/2018      Assessment & Plan:   Problem List Items Addressed This Visit      Cardiovascular and Mediastinum   Peripheral vascular disease, unspecified ([place])    She is now back on Plavix because of an ischemic toe.  Will hopefully get notes from the vascular surgeon.  Not sure if at this point they want her to be on Plavix for the rest of her life or if just for short period of time.  She did mention that eventually he would want me to take over this prescription which is perfectly fine.      Relevant Medications   lisinopril (ZESTRIL) 40 MG tablet   Essential hypertension, benign    Recent blood pressures have been elevated.  Increase lisinopril to 40 mg daily.  Did not want to increase the beta-blocker since she is already experiencing some fatigue though I do  feel like it is more mood related.      Relevant Medications   lisinopril (ZESTRIL) 40 MG tablet     Other   Primary insomnia    Continue Ambien as needed.      Grief - Primary    Just encouraged her to keep up the therapy and counselor.  Sounds like she has connected with someone through her daughter's work I do not know if this is an official treatment sessions or if this is just on the side but do encourage her to keep it up because I do think it would be helpful.  In the short-term we will try increasing duloxetine to 60 mg daily.  She says she thinks she has enough to double up for about 3 weeks to see if it is helpful if so then we can send over a new prescription.  I would like to follow her back up in 1 month to make sure that she is doing better.         I discussed the assessment and treatment plan with the patient. The patient was provided an opportunity to ask questions and all were answered. The patient agreed with the plan and demonstrated an understanding of the instructions.  The patient was advised to call back or seek an in-person evaluation if the symptoms worsen or if the condition fails to improve as anticipated.   Meds ordered this encounter  Medications  . lisinopril (ZESTRIL) 40 MG tablet    Sig: Take 1 tablet (40 mg total) by mouth daily.    Dispense:  90 tablet    Refill:  1    Follow-up: No follow-ups on file.    Nani Gasseratherine Montario Zilka, MD

## 2018-12-14 NOTE — Assessment & Plan Note (Addendum)
Recent blood pressures have been elevated.  Increase lisinopril to 40 mg daily.  Did not want to increase the beta-blocker since she is already experiencing some fatigue though I do feel like it is more mood related.

## 2018-12-14 NOTE — Assessment & Plan Note (Signed)
Continue Ambien as needed 

## 2018-12-14 NOTE — Assessment & Plan Note (Signed)
Just encouraged her to keep up the therapy and counselor.  Sounds like she has connected with someone through her daughter's work I do not know if this is an official treatment sessions or if this is just on the side but do encourage her to keep it up because I do think it would be helpful.  In the short-term we will try increasing duloxetine to 60 mg daily.  She says she thinks she has enough to double up for about 3 weeks to see if it is helpful if so then we can send over a new prescription.  I would like to follow her back up in 1 month to make sure that she is doing better.

## 2018-12-15 ENCOUNTER — Ambulatory Visit: Payer: Medicare PPO

## 2018-12-28 ENCOUNTER — Other Ambulatory Visit: Payer: Self-pay

## 2018-12-28 DIAGNOSIS — G8929 Other chronic pain: Secondary | ICD-10-CM

## 2018-12-28 MED ORDER — ZOLPIDEM TARTRATE 10 MG PO TABS: 5.0000 mg | ORAL_TABLET | Freq: Every evening | ORAL | 1 refills | Status: DC | PRN

## 2018-12-28 MED ORDER — HYDROCODONE-ACETAMINOPHEN 7.5-325 MG PO TABS: 1.0000 | ORAL_TABLET | Freq: Three times a day (TID) | ORAL | 0 refills | Status: DC

## 2019-01-02 ENCOUNTER — Other Ambulatory Visit: Payer: Self-pay | Admitting: Family Medicine

## 2019-01-02 DIAGNOSIS — I10 Essential (primary) hypertension: Secondary | ICD-10-CM

## 2019-01-02 DIAGNOSIS — F324 Major depressive disorder, single episode, in partial remission: Secondary | ICD-10-CM

## 2019-01-12 ENCOUNTER — Ambulatory Visit: Payer: Medicare PPO | Admitting: Family Medicine

## 2019-01-12 NOTE — Progress Notes (Deleted)
Established Patient Office Visit  Subjective:  Patient ID: Tracy Massey, female    DOB: [birth year]  Age: 68 y.o. MRN: [id number]  CC: No chief complaint on file.   HPI Tracy Massey presents for   She is here today for 1 month follow-up for hypertension.  We increased her lisinopril while I saw as her blood pressures have been running higher but she is also been under a lot of stress and dealing with grief with the recent loss of her husband and running into a lot of frustrations.  In regards to her grieving, she had been working with a Social worker who works with her daughter.  We also decided to increase her Cymbalta as well.  Follow-up CKD 3-had also referred her to nephrology back in July.  Past Medical History:  Diagnosis Date  . Asthma   . Diverticulosis   . Hyperlipidemia   . Hypertension   . Menopausal syndrome   . Neck pain, chronic     Past Surgical History:  Procedure Laterality Date  . ABDOMINAL HYSTERECTOMY  1986   TAH w/o BSO/endometriosis  . APPENDECTOMY  1983  . CHOLECYSTECTOMY  1995   lap  . HAND SURGERY    . [place]   LT foot  . KNEE ARTHROSCOPY  1983   RT knee and LT knee 1986, 1990  . TUBAL LIGATION     bilateral    Family History  Problem Relation Age of Onset  . Hypertension Mother   . Clotting disorder Mother   . Cancer Father 53       throat/died  . Hypertension Daughter   . Emphysema Paternal Grandfather   . Hypertension Brother     Social History   Socioeconomic History  . Marital status: Married    Spouse name: Not on file  . Number of children: Not on file  . Years of education: Not on file  . Highest education level: Not on file  Occupational History  . Not on file  Social Needs  . Financial resource strain: Not on file  . Food insecurity    Worry: Not on file    Inability: Not on file  . Transportation needs    Medical: Not on file    Non-medical: Not on file  Tobacco Use  . Smoking  status: Never Smoker  . Smokeless tobacco: Never Used  Substance and Sexual Activity  . Alcohol use: Yes    Comment: rare  . Drug use: No  . 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  Lifestyle  . Physical activity    Days per week: Not on file    Minutes per session: Not on file  . Stress: Not on file  Relationships  . Social Herbalist on phone: Not on file    Gets together: Not on file    Attends religious service: Not on file    Active member of club or organization: Not on file    Attends meetings of clubs or organizations: Not on file    Relationship status: Not on file  . Intimate partner violence    Fear of current or ex partner: Not on file    Emotionally abused: Not on file    Physically abused: Not on file    Forced sexual activity: Not on file  Other Topics Concern  . Not on  file  Social History Narrative  . Not on file    Outpatient Medications Prior to Visit  Medication Sig Dispense Refill  . amLODipine (NORVASC) 5 MG tablet TAKE 1 TABLET (5 MG TOTAL) BY MOUTH DAILY. 90 tablet 1  . aspirin EC 81 MG tablet Take 81 mg by mouth daily.    Marland Kitchen atorvastatin (LIPITOR) 10 MG tablet Take 1 tablet (10 mg total) by mouth at bedtime. 90 tablet 3  . buPROPion (WELLBUTRIN XL) 300 MG 24 hr tablet TAKE 1 TABLET (300 MG TOTAL) BY MOUTH DAILY. 90 tablet 1  . clopidogrel (PLAVIX) 75 MG tablet Take 75 mg by mouth daily.    . DULoxetine (CYMBALTA) 30 MG capsule TAKE ONE CAPSULE BY MOUTH EVERY DAY. 90 capsule 1  . HYDROcodone-acetaminophen (NORCO) 7.5-325 MG tablet Take 1 tablet by mouth every 8 (eight) hours. 90 tablet 0  . lisinopril (ZESTRIL) 40 MG tablet Take 1 tablet (40 mg total) by mouth daily. 90 tablet 1  . metoprolol succinate (TOPROL-XL) 25 MG 24 hr tablet TAKE 1 TABLET (25 MG TOTAL) BY MOUTH DAILY. 90 tablet 1  . omeprazole (PRILOSEC) 40 MG capsule Take 1 capsule (40  mg total) by mouth daily. 30 capsule 3  . zolpidem (AMBIEN) 10 MG tablet Take 0.5 tablets (5 mg total) by mouth at bedtime as needed for sleep. 45 tablet 1   No facility-administered medications prior to visit.     Allergies  Allergen Reactions  . Other Anaphylaxis    Prescoline  . Keflex [Cephalexin] Swelling    Hand swelling/redness  . Iodinated Diagnostic Agents     Pt reports that she recently had an MRI and they used dye she didn't have a reaction  . Metformin And Related Nausea And Vomiting  . Latex Rash  . Penicillins Rash  . Sulfa Antibiotics Nausea And Vomiting    ROS Review of Systems    Objective:    Physical Exam  There were no vitals taken for this visit. Wt Readings from Last 3 Encounters:  12/14/18 177 lb 9.6 oz (80.6 kg)  11/02/18 177 lb (80.3 kg)  10/27/18 178 lb (80.7 kg)     Health Maintenance Due  Topic Date Due  . INFLUENZA VACCINE  10/29/2018    There are no preventive care reminders to display for this patient.  Lab Results  Component Value Date   TSH 4.310 06/01/2011   Lab Results  Component Value Date   WBC 5.7 10/13/2018   HGB 13.7 10/13/2018   HCT 41.7 10/13/2018   MCV 92.5 10/13/2018   PLT 277 10/13/2018   Lab Results  Component Value Date   NA 143 07/13/2018   K 3.8 07/13/2018   CO2 28 07/13/2018   GLUCOSE 77 07/13/2018   BUN 15 07/13/2018   CREATININE 1.52 (H) 10/13/2018   BILITOT 0.5 07/13/2018   ALKPHOS 85 06/01/2011   AST 18 07/13/2018   ALT 12 07/13/2018   PROT 6.6 10/13/2018   ALBUMIN 4.1 06/01/2011   CALCIUM 9.9 10/13/2018   Lab Results  Component Value Date   CHOL 174 07/13/2018   Lab Results  Component Value Date   HDL 55 07/13/2018   Lab Results  Component Value Date   LDLCALC 98 07/13/2018   Lab Results  Component Value Date   TRIG 113 07/13/2018   Lab Results  Component Value Date   CHOLHDL 3.2 07/13/2018   Lab Results  Component Value Date   HGBA1C 5.8 (A) 07/13/2018  Assessment & Plan:   Problem List Items Addressed This Visit      Cardiovascular and Mediastinum   Essential hypertension, benign - Primary     Other   Grief   Depression, major, single episode, moderate (HCC)      No orders of the defined types were placed in this encounter.   Follow-up: No follow-ups on file.    Nani Gasseratherine Metheney, MD

## 2019-01-13 ENCOUNTER — Other Ambulatory Visit: Payer: Self-pay

## 2019-01-13 ENCOUNTER — Other Ambulatory Visit: Payer: Self-pay | Admitting: *Deleted

## 2019-01-13 ENCOUNTER — Ambulatory Visit (INDEPENDENT_AMBULATORY_CARE_PROVIDER_SITE_OTHER): Payer: Medicare PPO | Admitting: Sports Medicine

## 2019-01-13 DIAGNOSIS — G8929 Other chronic pain: Secondary | ICD-10-CM

## 2019-01-13 DIAGNOSIS — M542 Cervicalgia: Secondary | ICD-10-CM

## 2019-01-13 MED ORDER — DULOXETINE HCL 60 MG PO CPEP: 60.0000 mg | ORAL_CAPSULE | Freq: Every day | ORAL | 1 refills | Status: DC

## 2019-01-13 NOTE — Progress Notes (Signed)
Subjective:    CC: Neck and back pain  HPI: Tracy Massey returns, she is a pleasant 68 year old female with cervical and lumbar spondylosis, she does well with a every 3 month set of trigger point injections.  I reviewed the past medical history, family history, social history, surgical history, and allergies today and no changes were needed.  Please see the problem list section below in epic for further details.  Past Medical History: Past Medical History:  Diagnosis Date  . Asthma   . Diverticulosis   . Hyperlipidemia   . Hypertension   . Menopausal syndrome   . Neck pain, chronic    Past Surgical History: Past Surgical History:  Procedure Laterality Date  . ABDOMINAL HYSTERECTOMY  1986   TAH w/o BSO/endometriosis  . APPENDECTOMY  1983  . CHOLECYSTECTOMY  1995   lap  . HAND SURGERY    . HEEL SPUR SURGERY  1996   LT foot  . KNEE ARTHROSCOPY  1983   RT knee and LT knee 1986, 1990  . TUBAL LIGATION     bilateral   Social History: Social History   Socioeconomic History  . Marital status: Married    Spouse name: Not on file  . Number of children: Not on file  . Years of education: Not on file  . Highest education level: Not on file  Occupational History  . Not on file  Social Needs  . Financial resource strain: Not on file  . Food insecurity    Worry: Not on file    Inability: Not on file  . Transportation needs    Medical: Not on file    Non-medical: Not on file  Tobacco Use  . Smoking status: Never Smoker  . Smokeless tobacco: Never Used  Substance and Sexual Activity  . Alcohol use: Yes    Comment: rare  . Drug use: No  . 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  Lifestyle  . Physical activity    Days per week: Not on file    Minutes per session: Not on file  . Stress: Not on file  Relationships  . Social Musician on phone: Not on file    Gets  together: Not on file    Attends religious service: Not on file    Active member of club or organization: Not on file    Attends meetings of clubs or organizations: Not on file    Relationship status: Not on file  Other Topics Concern  . Not on file  Social History Narrative  . Not on file   Family History: Family History  Problem Relation Age of Onset  . Hypertension Mother   . Clotting disorder Mother   . Cancer Father 33       throat/died  . Hypertension Daughter   . Emphysema Paternal Grandfather   . Hypertension Brother    Allergies: Allergies  Allergen Reactions  . Other Anaphylaxis    Prescoline  . Keflex [Cephalexin] Swelling    Hand swelling/redness  . Iodinated Diagnostic Agents     Pt reports that she recently had an MRI and they used dye she didn't have a reaction  . Metformin And Related Nausea And Vomiting  . Latex Rash  . Penicillins Rash  . Sulfa Antibiotics Nausea And Vomiting   Medications: See med rec.  Review of Systems: No fevers, chills, night sweats, weight  loss, chest pain, or shortness of breath.   Objective:    General: Well Developed, well nourished, and in no acute distress.  Neuro: Alert and oriented x3, extra-ocular muscles intact, sensation grossly intact.  HEENT: Normocephalic, atraumatic, pupils equal round reactive to light, neck supple, no masses, no lymphadenopathy, thyroid nonpalpable.  Skin: Warm and dry, no rashes. Cardiac: Regular rate and rhythm, no murmurs rubs or gallops, no lower extremity edema.  Respiratory: Clear to auscultation bilaterally. Not using accessory muscles, speaking in full sentences.  Procedure:  Injection of #6 paralumbar and paracervical trigger points Consent obtained and verified. Time-out conducted. Noted no overlying erythema, induration, or other signs of local infection. Skin prepped in a sterile fashion. Topical analgesic spray: Ethyl chloride. Completed without difficulty. Meds: A total of 2  cc Kenalog 40, 4 cc lidocaine, 4 cc bupivacaine spread out between the triggerpoints Pain immediately improved suggesting accurate placement of the medication. Advised to call if fevers/chills, erythema, induration, drainage, or persistent bleeding.  Impression and Recommendations:    Chronic neck pain Repeat bilateral paracervical, trapezius, quadratus lumborum injections. As before she has baseline cervical facet arthritis worse at the C7-T1 level with multilevel cervical DDD that can all services interventional targets in the future if trigger point injections exhibit waning efficacy.   ___________________________________________ Gwen Her. Dianah Field, M.D., ABFM., CAQSM. Primary Care and Sports Medicine [facility]  Adjunct Professor of [employer] [employer]

## 2019-01-13 NOTE — Assessment & Plan Note (Signed)
Repeat bilateral paracervical, trapezius, quadratus lumborum injections. As before she has baseline cervical facet arthritis worse at the C7-T1 level with multilevel cervical DDD that can all services interventional targets in the future if trigger point injections exhibit waning efficacy.

## 2019-01-17 ENCOUNTER — Ambulatory Visit (INDEPENDENT_AMBULATORY_CARE_PROVIDER_SITE_OTHER): Payer: Medicare PPO | Admitting: Family Medicine

## 2019-01-17 ENCOUNTER — Encounter: Payer: Self-pay | Admitting: Family Medicine

## 2019-01-17 ENCOUNTER — Other Ambulatory Visit: Payer: Self-pay

## 2019-01-17 VITALS — BP 152/77 | HR 63 | Ht 63.0 in | Wt 178.0 lb

## 2019-01-17 DIAGNOSIS — G8929 Other chronic pain: Secondary | ICD-10-CM

## 2019-01-17 DIAGNOSIS — R7301 Impaired fasting glucose: Secondary | ICD-10-CM

## 2019-01-17 DIAGNOSIS — I10 Essential (primary) hypertension: Secondary | ICD-10-CM

## 2019-01-17 DIAGNOSIS — R5383 Other fatigue: Secondary | ICD-10-CM

## 2019-01-17 DIAGNOSIS — F4321 Adjustment disorder with depressed mood: Secondary | ICD-10-CM

## 2019-01-17 DIAGNOSIS — F321 Major depressive disorder, single episode, moderate: Secondary | ICD-10-CM

## 2019-01-17 MED ORDER — HYDROCODONE-ACETAMINOPHEN 7.5-325 MG PO TABS: 1.0000 | ORAL_TABLET | Freq: Three times a day (TID) | ORAL | 0 refills | Status: DC

## 2019-01-17 NOTE — Assessment & Plan Note (Signed)
Due for repeat urine drug screen.  Refill sent to pharmacy today.  Did encourage her to also go and get up-to-date blood work. Indication for chronic opioid: DDD  Medication and dose: Norco 7.5/325.  1 tab every 8 hours as needed # pills per month: 90 Last UDS date: 06/2018 Opioid Treatment Agreement signed (Y/N): Y  Opioid Treatment Agreement last reviewed with patient:  Yes [facility] reviewed this encounter (include red flags): Yes

## 2019-01-17 NOTE — Assessment & Plan Note (Signed)
Sounds like she is making some progress.  She is actually been able to go through some of her husband's things recently and that has been challenging but she has been able to do it.  Again I still think she would benefit from some grief counseling and just encouraged her to consider it.
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
## 2019-01-17 NOTE — Assessment & Plan Note (Signed)
Much improved.  PHQ-9 score down to 7 and GAD-7 score down to 1.  She really is doing better on the increased dose of Cymbalta so we will plan to continue with 60 mg.  Plan to follow-up in about 3 months.  Did encourage her to consider doing some formal therapy/counseling I be happy to refer her if at any point she would feel like she would benefit from that.

## 2019-01-18 LAB — COMPLETE METABOLIC PANEL WITH GFR
AG Ratio: 1.8 (calc) (ref 1.0–2.5)
ALT: 18 U/L (ref 6–29)
AST: 27 U/L (ref 10–35)
Albumin: 4.4 g/dL (ref 3.6–5.1)
Alkaline phosphatase (APISO): 100 U/L (ref 37–153)
BUN/Creatinine Ratio: 19 (calc) (ref 6–22)
BUN: 25 mg/dL (ref 7–25)
CO2: 26 mmol/L (ref 20–32)
Calcium: 9.5 mg/dL (ref 8.6–10.4)
Chloride: 104 mmol/L (ref 98–110)
Creat: 1.29 mg/dL — ABNORMAL HIGH (ref 0.60–0.93)
GFR, Est African American: 49 mL/min/{1.73_m2} — ABNORMAL LOW (ref >=60)
GFR, Est Non African American: 42 mL/min/{1.73_m2} — ABNORMAL LOW (ref >=60)
Globulin: 2.5 g/dL (calc) (ref 1.9–3.7)
Glucose, Bld: 90 mg/dL (ref 65–99)
Potassium: 4.3 mmol/L (ref 3.5–5.3)
Sodium: 141 mmol/L (ref 135–146)
Total Bilirubin: 0.8 mg/dL (ref 0.2–1.2)
Total Protein: 6.9 g/dL (ref 6.1–8.1)

## 2019-01-18 LAB — HEMOGLOBIN A1C
Hgb A1c MFr Bld: 5.3 % of total Hgb (ref <5.7)
Mean Plasma Glucose: 105 (calc)
eAG (mmol/L): 5.8 (calc)

## 2019-01-18 LAB — IRON,TIBC AND FERRITIN PANEL
%SAT: 34 % (calc) (ref 16–45)
Ferritin: 52 ng/mL (ref 16–288)
Iron: 131 ug/dL (ref 45–160)
TIBC: 387 mcg/dL (calc) (ref 250–450)

## 2019-01-18 LAB — CBC
HCT: 43.1 % (ref 35.0–45.0)
Hemoglobin: 14.3 g/dL (ref 11.7–15.5)
MCH: 30.2 pg (ref 27.0–33.0)
MCHC: 33.2 g/dL (ref 32.0–36.0)
MCV: 91.1 fL (ref 80.0–100.0)
MPV: 10.7 fL (ref 7.5–12.5)
Platelets: 314 10*3/uL (ref 140–400)
RBC: 4.73 10*6/uL (ref 3.80–5.10)
RDW: 13.2 % (ref 11.0–15.0)
WBC: 9.4 10*3/uL (ref 3.8–10.8)

## 2019-01-18 LAB — VITAMIN B12: Vitamin B-12: 399 pg/mL (ref 200–1100)

## 2019-01-18 LAB — TSH: TSH: 2.2 mIU/L (ref 0.40–4.50)

## 2019-02-08 ENCOUNTER — Ambulatory Visit (INDEPENDENT_AMBULATORY_CARE_PROVIDER_SITE_OTHER): Payer: Medicare PPO

## 2019-02-08 ENCOUNTER — Other Ambulatory Visit: Payer: Self-pay

## 2019-02-08 DIAGNOSIS — Z1231 Encounter for screening mammogram for malignant neoplasm of breast: Secondary | ICD-10-CM | POA: Diagnosis not present

## 2019-02-24 ENCOUNTER — Other Ambulatory Visit: Payer: Self-pay | Admitting: Family Medicine

## 2019-02-24 DIAGNOSIS — G8929 Other chronic pain: Secondary | ICD-10-CM

## 2019-02-27 MED ORDER — HYDROCODONE-ACETAMINOPHEN 7.5-325 MG PO TABS: 1.0000 | ORAL_TABLET | Freq: Three times a day (TID) | ORAL | 0 refills | Status: DC

## 2019-03-21 ENCOUNTER — Other Ambulatory Visit: Payer: Self-pay

## 2019-03-21 ENCOUNTER — Encounter: Payer: Self-pay | Admitting: Family Medicine

## 2019-03-21 ENCOUNTER — Ambulatory Visit (INDEPENDENT_AMBULATORY_CARE_PROVIDER_SITE_OTHER): Payer: Medicare PPO | Admitting: Family Medicine

## 2019-03-21 VITALS — BP 124/73 | HR 64 | Ht 63.0 in | Wt 184.0 lb

## 2019-03-21 DIAGNOSIS — M542 Cervicalgia: Secondary | ICD-10-CM | POA: Diagnosis not present

## 2019-03-21 DIAGNOSIS — G8929 Other chronic pain: Secondary | ICD-10-CM

## 2019-03-21 DIAGNOSIS — M7918 Myalgia, other site: Secondary | ICD-10-CM | POA: Diagnosis not present

## 2019-03-21 DIAGNOSIS — M5030 Other cervical disc degeneration, unspecified cervical region: Secondary | ICD-10-CM

## 2019-03-21 DIAGNOSIS — F321 Major depressive disorder, single episode, moderate: Secondary | ICD-10-CM

## 2019-03-21 MED ORDER — HYDROCODONE-ACETAMINOPHEN 7.5-325 MG PO TABS: 1.0000 | ORAL_TABLET | Freq: Three times a day (TID) | ORAL | 0 refills | Status: DC

## 2019-03-21 NOTE — Assessment & Plan Note (Signed)
Been really well on 60 mg Cymbalta.  PHQ-9 score of 3 and GAD-7 score of 2 which is great.  Continue to follow periodically.  Follow-up 6 months.  Continue Wellbutrin as well.  Consider decreasing Wellbutrin dose at follow-up appointment.
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
## 2019-03-21 NOTE — Assessment & Plan Note (Addendum)
For urine drug screen today.  Will call with results once available.  Prescription sent and dated today.  Indication for chronic opioid: DDD  Medication and dose: Norco 7.5/325.  1 tab every 8 hours as needed # pills per month: 90 Last UDS date: 06/2018 Opioid Treatment Agreement signed (Y/N): Y            Opioid Treatment Agreement last reviewed with patient:  Yes [facility] reviewed this encounter (include red flags): Yes

## 2019-03-21 NOTE — Assessment & Plan Note (Signed)
Continue with Tylenol and heat.  Did not adjust her pain regimen today.  She has a follow-up coming up with Dr. Dianah Field soon to discuss this further.  Might benefit from physical therapy.

## 2019-03-24 LAB — PAIN MGMT, PROFILE 5 W/O MEDMATCH U
Amphetamine: NEGATIVE ng/mL
Amphetamines: NEGATIVE ng/mL
Barbiturates: NEGATIVE ng/mL
Benzodiazepines: NEGATIVE ng/mL
Cocaine Metabolite: NEGATIVE ng/mL
Codeine: NEGATIVE ng/mL
Creatinine: 196.2 mg/dL
Hydrocodone: 2719 ng/mL
Hydromorphone: 106 ng/mL
Marijuana Metabolite: NEGATIVE ng/mL
Methadone Metabolite: NEGATIVE ng/mL
Methamphetamine: NEGATIVE ng/mL
Morphine: NEGATIVE ng/mL
Norhydrocodone: 5839 ng/mL
Opiates: POSITIVE ng/mL
Oxidant: NEGATIVE ug/mL
Oxycodone: NEGATIVE ng/mL
pH: 5.7 (ref 4.5–9.0)

## 2019-04-19 ENCOUNTER — Ambulatory Visit (INDEPENDENT_AMBULATORY_CARE_PROVIDER_SITE_OTHER): Payer: Medicare HMO | Admitting: Sports Medicine

## 2019-04-19 ENCOUNTER — Other Ambulatory Visit: Payer: Self-pay

## 2019-04-19 DIAGNOSIS — M5030 Other cervical disc degeneration, unspecified cervical region: Secondary | ICD-10-CM | POA: Diagnosis not present

## 2019-04-19 NOTE — Progress Notes (Signed)
    Procedures performed today:    Procedure: Injection of #6 paralumbar and paracervical trigger points Consent obtained and verified. Time-out conducted. Noted no overlying erythema, induration, or other signs of local infection. Skin prepped in a sterile fashion. Topical analgesic spray: Ethyl chloride. Completed without difficulty. Meds: A total of 2 cc Kenalog 40, 4 cc lidocaine, 4 cc bupivacaine spread out between the triggerpoints Pain immediately improved suggesting accurate placement of the medication. Advised to call if fevers/chills, erythema, induration, drainage, or persistent bleeding.  Independent interpretation of tests performed by another provider:   None.  Impression and Recommendations:    DDD (degenerative disc disease), cervical Worsening bilateral neck and back pain. Repeat bilateral paracervical and quadratus lumborum injections. As before she has baseline cervical facet arthritis worse at the C7-T1 level with multilevel cervical DDD that can serve as interventional targets in the future if trigger point injections exhibit waning efficacy.    ___________________________________________ Ihor Austin. Benjamin Stain, M.D., ABFM., CAQSM. Primary Care and Sports Medicine [facility]  Adjunct Instructor of Family Medicine  [employer]

## 2019-04-19 NOTE — Assessment & Plan Note (Addendum)
Worsening bilateral neck and back pain. Repeat bilateral paracervical and quadratus lumborum injections. As before she has baseline cervical facet arthritis worse at the C7-T1 level with multilevel cervical DDD that can serve as interventional targets in the future if trigger point injections exhibit waning efficacy.

## 2019-04-20 ENCOUNTER — Other Ambulatory Visit: Payer: Self-pay

## 2019-04-20 DIAGNOSIS — G8929 Other chronic pain: Secondary | ICD-10-CM

## 2019-04-20 MED ORDER — HYDROCODONE-ACETAMINOPHEN 7.5-325 MG PO TABS: 1.0000 | ORAL_TABLET | Freq: Three times a day (TID) | ORAL | 0 refills | Status: DC

## 2019-05-08 DIAGNOSIS — H02403 Unspecified ptosis of bilateral eyelids: Secondary | ICD-10-CM | POA: Diagnosis not present

## 2019-05-08 DIAGNOSIS — H0100B Unspecified blepharitis left eye, upper and lower eyelids: Secondary | ICD-10-CM | POA: Diagnosis not present

## 2019-05-08 DIAGNOSIS — H25813 Combined forms of age-related cataract, bilateral: Secondary | ICD-10-CM | POA: Diagnosis not present

## 2019-05-08 DIAGNOSIS — E119 Type 2 diabetes mellitus without complications: Secondary | ICD-10-CM | POA: Diagnosis not present

## 2019-05-08 DIAGNOSIS — H0100A Unspecified blepharitis right eye, upper and lower eyelids: Secondary | ICD-10-CM | POA: Diagnosis not present

## 2019-05-08 DIAGNOSIS — H527 Unspecified disorder of refraction: Secondary | ICD-10-CM | POA: Diagnosis not present

## 2019-05-08 LAB — HM DIABETES EYE EXAM

## 2019-05-22 ENCOUNTER — Ambulatory Visit (INDEPENDENT_AMBULATORY_CARE_PROVIDER_SITE_OTHER): Payer: Medicare HMO | Admitting: Family Medicine

## 2019-05-22 ENCOUNTER — Other Ambulatory Visit: Payer: Self-pay

## 2019-05-22 ENCOUNTER — Encounter: Payer: Self-pay | Admitting: Family Medicine

## 2019-05-22 VITALS — BP 136/79 | HR 73 | Ht 63.0 in | Wt 187.0 lb

## 2019-05-22 DIAGNOSIS — G8929 Other chronic pain: Secondary | ICD-10-CM | POA: Diagnosis not present

## 2019-05-22 DIAGNOSIS — I10 Essential (primary) hypertension: Secondary | ICD-10-CM

## 2019-05-22 DIAGNOSIS — R7301 Impaired fasting glucose: Secondary | ICD-10-CM | POA: Diagnosis not present

## 2019-05-22 DIAGNOSIS — E669 Obesity, unspecified: Secondary | ICD-10-CM

## 2019-05-22 DIAGNOSIS — F5101 Primary insomnia: Secondary | ICD-10-CM | POA: Diagnosis not present

## 2019-05-22 DIAGNOSIS — M5030 Other cervical disc degeneration, unspecified cervical region: Secondary | ICD-10-CM | POA: Diagnosis not present

## 2019-05-22 DIAGNOSIS — M542 Cervicalgia: Secondary | ICD-10-CM

## 2019-05-22 DIAGNOSIS — Z6833 Body mass index (BMI) 33.0-33.9, adult: Secondary | ICD-10-CM

## 2019-05-22 LAB — POCT GLYCOSYLATED HEMOGLOBIN (HGB A1C): Hemoglobin A1C: 5.4 % (ref 4.0–5.6)

## 2019-05-22 MED ORDER — HYDROCODONE-ACETAMINOPHEN 7.5-325 MG PO TABS: 1.0000 | ORAL_TABLET | Freq: Three times a day (TID) | ORAL | 0 refills | Status: DC

## 2019-05-22 MED ORDER — LISINOPRIL 40 MG PO TABS: 40.0000 mg | ORAL_TABLET | Freq: Every day | ORAL | 1 refills | Status: DC

## 2019-05-22 MED ORDER — ZOLPIDEM TARTRATE 10 MG PO TABS: 5.0000 mg | ORAL_TABLET | Freq: Every evening | ORAL | 1 refills | Status: DC | PRN

## 2019-05-22 MED ORDER — TOPIRAMATE 25 MG PO TABS: 25.0000 mg | ORAL_TABLET | Freq: Two times a day (BID) | ORAL | 1 refills | Status: DC

## 2019-05-22 NOTE — Assessment & Plan Note (Signed)
Blood pressure looks okay today.  I really like to see her systolic just a little bit lower.  But we will also try to work on some weight loss and see if that helps.

## 2019-05-22 NOTE — Assessment & Plan Note (Signed)
A1c looks great today at 5.4.  Plan to recheck again in 6 months.  Continue to work on healthy choices really discussed the importance of really increasing protein in her diet she often skips meals so we also discussed eating more regularly.

## 2019-05-22 NOTE — Addendum Note (Signed)
Addended by: Nani Gasser D on: 05/22/2019 11:06 AM   Modules accepted: Orders

## 2019-05-22 NOTE — Assessment & Plan Note (Signed)
Due for RF on her Ambien.
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
## 2019-05-22 NOTE — Assessment & Plan Note (Addendum)
Continue to work on healthy choices really discussed the importance of really increasing protein in her diet she often skips meals so we also discussed eating more regularly.  Discussed her allergies around increasing proteins such as using protein shakes etc.  Also discussed medication as an option.  She did not tolerate Metformin.  She has taken Topamax in the past for migraines.  New prescription sent to pharmacy.

## 2019-05-22 NOTE — Assessment & Plan Note (Signed)
Indication for chronic opioid: DDD  Medication and dose: Norco 7.5/325.  1 tab every 8 hours as needed # pills per month: 90 Last UDS date:05/2019 Opioid Treatment Agreement signed (Y/N): 05/22/2019         Opioid Treatment Agreement last reviewed with patient:  Yes [facility] reviewed this encounter (include red flags): Yes

## 2019-05-25 LAB — PAIN MGMT, PROFILE 5 W/O MEDMATCH U
Amphetamines: NEGATIVE ng/mL
Barbiturates: NEGATIVE ng/mL
Benzodiazepines: NEGATIVE ng/mL
Cocaine Metabolite: NEGATIVE ng/mL
Codeine: NEGATIVE ng/mL
Creatinine: 172.3 mg/dL
Hydrocodone: 1869 ng/mL
Hydromorphone: NEGATIVE ng/mL
Marijuana Metabolite: NEGATIVE ng/mL
Methadone Metabolite: NEGATIVE ng/mL
Morphine: NEGATIVE ng/mL
Norhydrocodone: 2528 ng/mL
Opiates: POSITIVE ng/mL
Oxidant: NEGATIVE ug/mL
Oxycodone: NEGATIVE ng/mL
pH: 7.2 (ref 4.5–9.0)

## 2019-06-08 ENCOUNTER — Encounter: Payer: Self-pay | Admitting: Family Medicine

## 2019-06-20 ENCOUNTER — Ambulatory Visit (INDEPENDENT_AMBULATORY_CARE_PROVIDER_SITE_OTHER): Payer: Medicare HMO | Admitting: Family Medicine

## 2019-06-20 ENCOUNTER — Other Ambulatory Visit: Payer: Self-pay

## 2019-06-20 ENCOUNTER — Encounter: Payer: Self-pay | Admitting: Family Medicine

## 2019-06-20 VITALS — BP 140/71 | HR 59 | Ht 63.0 in | Wt 183.0 lb

## 2019-06-20 DIAGNOSIS — E669 Obesity, unspecified: Secondary | ICD-10-CM

## 2019-06-20 DIAGNOSIS — F321 Major depressive disorder, single episode, moderate: Secondary | ICD-10-CM

## 2019-06-20 DIAGNOSIS — R5383 Other fatigue: Secondary | ICD-10-CM | POA: Diagnosis not present

## 2019-06-20 DIAGNOSIS — G43109 Migraine with aura, not intractable, without status migrainosus: Secondary | ICD-10-CM | POA: Diagnosis not present

## 2019-06-20 DIAGNOSIS — F324 Major depressive disorder, single episode, in partial remission: Secondary | ICD-10-CM | POA: Diagnosis not present

## 2019-06-20 DIAGNOSIS — F4321 Adjustment disorder with depressed mood: Secondary | ICD-10-CM

## 2019-06-20 MED ORDER — BUPROPION HCL ER (XL) 150 MG PO TB24: ORAL_TABLET | ORAL | 0 refills | Status: DC

## 2019-06-20 NOTE — Assessment & Plan Note (Signed)
Congratulated her on the weight loss and dietary changes.  This is fantastic but I just want to get her so she is more active and engaged in keeping the house clean and doing her typical daily activities.

## 2019-06-20 NOTE — Assessment & Plan Note (Signed)
I really feel like she is having a cycle of grief right now.  Plan referral for therapy/counseling.  See medication adjustments as above.

## 2019-06-20 NOTE — Assessment & Plan Note (Addendum)
PHQ-9 score 15 today which is up from previous of 7.  GAD-7 score of 8 today which is up from previous of 3.  Rates her symptoms as somewhat difficult.  He would really like to taper off the Wellbutrin.  She feels like that might be part of what is going on.  She has been on the Wellbutrin so I am not as clearly convinced.  I do think she just has some worsening of depression and is no longer seeing the therapist that she was seeing previously.  She is not interested in referral to a new therapist or counselor at this point in time.  We will go ahead and taper off the Wellbutrin over the next 2 weeks and see how she is doing if she is not improving then we can make adjustments to the Cymbalta.  We can either increase her dose or just switch medications completely.

## 2019-06-20 NOTE — Progress Notes (Signed)
Established Patient Office Visit  Subjective:  Patient ID: Tracy Massey, female    DOB: April 10, 1950  Age: 69 y.o. MRN: [id number]  CC:  Chief Complaint  Patient presents with  . mood    she reports that     HPI Tracy Massey presents for follow-up depression.  She feels like over the last month her Cymbalta has not been working nearly as well.  She is currently on 60 mg of Cymbalta and does take Ambien at bedtime for sleep.  He is also on 300 mg of Wellbutrin.  He is just really struggling with motivation.  She says she has lots of days where she just never gets out of bed and stays in bed until the early afternoon.  She just feels tired.  She denies feeling tearful she has noticed that she is been a little bit more irritable.  Her dog who she lives has been getting on her nerves a little bit more.  She says on the last month she really only remembers 2 good days.  She is excited that she has lost weight.  She says she has cut back and has trying to really increase the protein in her diet and has been doing some protein shakes.  She says since changing her diet she actually has had significant improvement in her migraine headaches.  She was previously getting 2 to 3/month and now has not had any in the last month.  So she is happy about that change.  Past Medical History:  Diagnosis Date  . Asthma   . Diverticulosis   . Hyperlipidemia   . Hypertension   . Menopausal syndrome   . Neck pain, chronic     Past Surgical History:  Procedure Laterality Date  . ABDOMINAL HYSTERECTOMY  1986   TAH w/o BSO/endometriosis  . APPENDECTOMY  1983  . CHOLECYSTECTOMY  1995   lap  . HAND SURGERY    . [place]   LT foot  . KNEE ARTHROSCOPY  1983   RT knee and LT knee 1986, 1990  . TUBAL LIGATION     bilateral    Family History  Problem Relation Age of Onset  . Hypertension Mother   . Clotting disorder Mother   . Cancer Father 92       throat/died  .  Hypertension Daughter   . Hypertension Brother   . Emphysema Paternal Grandfather     Social History   Socioeconomic History  . Marital status: Married    Spouse name: Not on file  . Number of children: Not on file  . Years of education: Not on file  . Highest education level: Not on file  Occupational History  . Not on file  Tobacco Use  . Smoking status: Never Smoker  . Smokeless tobacco: Never Used  Substance and Sexual Activity  . Alcohol use: Yes    Comment: rare  . Drug use: No  . 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  Other Topics Concern  . Not on file  Social History Narrative  . Not on file   Social Determinants of Health   Financial Resource Strain:   . Difficulty of Paying Living Expenses:   Food Insecurity:   . Worried About Charity fundraiser in the Last Year:   . Arboriculturist in the Last Year:   [employer]  Needs:   . Lack of Transportation (Medical):   Marland Kitchen Lack of Transportation (Non-Medical):   Physical Activity:   . Days of Exercise per Week:   . Minutes of Exercise per Session:   Stress:   . Feeling of Stress :   Social Connections:   . Frequency of Communication with Friends and Family:   . Frequency of Social Gatherings with Friends and Family:   . Attends Religious Services:   . Active Member of Clubs or Organizations:   . Attends Banker Meetings:   Marland Kitchen Marital Status:   Intimate Partner Violence:   . Fear of Current or Ex-Partner:   . Emotionally Abused:   Marland Kitchen Physically Abused:   . Sexually Abused:     Outpatient Medications Prior to Visit  Medication Sig Dispense Refill  . amLODipine (NORVASC) 5 MG tablet TAKE 1 TABLET (5 MG TOTAL) BY MOUTH DAILY. 90 tablet 1  . aspirin EC 81 MG tablet Take 81 mg by mouth daily.    Marland Kitchen atorvastatin (LIPITOR) 10 MG tablet Take 1 tablet (10 mg total) by mouth at bedtime. 90 tablet 3  .  DULoxetine (CYMBALTA) 60 MG capsule Take 1 capsule (60 mg total) by mouth daily. 90 capsule 1  . HYDROcodone-acetaminophen (NORCO) 7.5-325 MG tablet Take 1 tablet by mouth every 8 (eight) hours. 90 tablet 0  . lisinopril (ZESTRIL) 40 MG tablet Take 1 tablet (40 mg total) by mouth daily. 90 tablet 1  . metoprolol succinate (TOPROL-XL) 25 MG 24 hr tablet TAKE 1 TABLET (25 MG TOTAL) BY MOUTH DAILY. 90 tablet 1  . omeprazole (PRILOSEC) 40 MG capsule Take 1 capsule (40 mg total) by mouth daily. 30 capsule 3  . topiramate (TOPAMAX) 25 MG tablet Take 1 tablet (25 mg total) by mouth 2 (two) times daily. 60 tablet 1  . zolpidem (AMBIEN) 10 MG tablet Take 0.5 tablets (5 mg total) by mouth at bedtime as needed for sleep. 45 tablet 1  . buPROPion (WELLBUTRIN XL) 300 MG 24 hr tablet TAKE 1 TABLET (300 MG TOTAL) BY MOUTH DAILY. 90 tablet 1   No facility-administered medications prior to visit.    Allergies  Allergen Reactions  . Other Anaphylaxis    Prescoline  . Keflex [Cephalexin] Swelling    Hand swelling/redness  . Iodinated Diagnostic Agents     Pt reports that she recently had an MRI and they used dye she didn't have a reaction  . Metformin And Related Nausea And Vomiting  . Latex Rash  . Penicillins Rash  . Sulfa Antibiotics Nausea And Vomiting    ROS Review of Systems    Objective:    Physical Exam  Constitutional: She is oriented to person, place, and time. She appears well-developed and well-nourished.  HENT:  Head: Normocephalic and atraumatic.  Cardiovascular: Normal rate, regular rhythm and normal heart sounds.  Pulmonary/Chest: Effort normal and breath sounds normal.  Neurological: She is alert and oriented to person, place, and time.  Skin: Skin is warm and dry.  Psychiatric: She has a normal mood and affect. Her behavior is normal.    BP 140/71   Pulse (!) 59   Ht 5\' 3"  (1.6 m)   Wt 183 lb (83 kg)   SpO2 99%   BMI 32.42 kg/m  Wt Readings from Last 3 Encounters:   06/20/19 183 lb (83 kg)  05/22/19 187 lb (84.8 kg)  03/21/19 184 lb (83.5 kg)     There are no preventive care reminders  to display for this patient.  There are no preventive care reminders to display for this patient.  Lab Results  Component Value Date   TSH 2.20 01/17/2019   Lab Results  Component Value Date   WBC 9.4 01/17/2019   HGB 14.3 01/17/2019   HCT 43.1 01/17/2019   MCV 91.1 01/17/2019   PLT 314 01/17/2019   Lab Results  Component Value Date   NA 141 01/17/2019   K 4.3 01/17/2019   CO2 26 01/17/2019   GLUCOSE 90 01/17/2019   BUN 25 01/17/2019   CREATININE 1.29 (H) 01/17/2019   BILITOT 0.8 01/17/2019   ALKPHOS 85 06/01/2011   AST 27 01/17/2019   ALT 18 01/17/2019   PROT 6.9 01/17/2019   ALBUMIN 4.1 06/01/2011   CALCIUM 9.5 01/17/2019   Lab Results  Component Value Date   CHOL 174 07/13/2018   Lab Results  Component Value Date   HDL 55 07/13/2018   Lab Results  Component Value Date   LDLCALC 98 07/13/2018   Lab Results  Component Value Date   TRIG 113 07/13/2018   Lab Results  Component Value Date   CHOLHDL 3.2 07/13/2018   Lab Results  Component Value Date   HGBA1C 5.4 05/22/2019      Assessment & Plan:   Problem List Items Addressed This Visit      Cardiovascular and Mediastinum   Migraine with aura    Proved with recent dietary changes, weight loss and increased protein intake.      Relevant Medications   buPROPion (WELLBUTRIN XL) 150 MG 24 hr tablet     Other   Obesity, Class I, BMI 30-34.9    Congratulated her on the weight loss and dietary changes.  This is fantastic but I just want to get her so she is more active and engaged in keeping the house clean and doing her typical daily activities.      Grief    I really feel like she is having a cycle of grief right now.  Plan referral for therapy/counseling.  See medication adjustments as above.      Depression, major, single episode, moderate (HCC)    PHQ-9 score 15  today which is up from previous of 7.  GAD-7 score of 8 today which is up from previous of 3.  Rates her symptoms as somewhat difficult.  He would really like to taper off the Wellbutrin.  She feels like that might be part of what is Massey on.  She has been on the Wellbutrin so I am not as clearly convinced.  I do think she just has some worsening of depression and is no longer seeing the therapist that she was seeing previously.  She is not interested in referral to a new therapist or counselor at this point in time.  We will go ahead and taper off the Wellbutrin over the next 2 weeks and see how she is doing if she is not improving then we can make adjustments to the Cymbalta.  We can either increase her dose or just switch medications completely.      Relevant Medications   buPROPion (WELLBUTRIN XL) 150 MG 24 hr tablet    Other Visit Diagnoses    Fatigue, unspecified type    -  Primary   Relevant Orders   CBC   COMPLETE METABOLIC PANEL WITH GFR   TSH   Depression, major, single episode, in partial remission (HCC)       Relevant Medications  buPROPion (WELLBUTRIN XL) 150 MG 24 hr tablet     Fatigue -We will just check some labs just to rule out any other causes such as anemia or thyroid disorder.   Meds ordered this encounter  Medications  . buPROPion (WELLBUTRIN XL) 150 MG 24 hr tablet    Sig: Take 1 tablet (150 mg total) by mouth daily for 10 days, THEN 1 tablet (150 mg total) every other day for 10 days.    Dispense:  15 tablet    Refill:  0    Follow-up: Return in about 15 days (around 07/05/2019) for mood.    Nani Gasser, MD

## 2019-06-20 NOTE — Assessment & Plan Note (Signed)
Proved with recent dietary changes, weight loss and increased protein intake.

## 2019-06-21 LAB — COMPLETE METABOLIC PANEL WITH GFR
AG Ratio: 1.8 (calc) (ref 1.0–2.5)
ALT: 25 U/L (ref 6–29)
AST: 21 U/L (ref 10–35)
Albumin: 4.2 g/dL (ref 3.6–5.1)
Alkaline phosphatase (APISO): 134 U/L (ref 37–153)
BUN/Creatinine Ratio: 11 (calc) (ref 6–22)
BUN: 18 mg/dL (ref 7–25)
CO2: 26 mmol/L (ref 20–32)
Calcium: 9.9 mg/dL (ref 8.6–10.4)
Chloride: 109 mmol/L (ref 98–110)
Creat: 1.68 mg/dL — ABNORMAL HIGH (ref 0.60–0.93)
GFR, Est African American: 35 mL/min/{1.73_m2} — ABNORMAL LOW (ref >=60)
GFR, Est Non African American: 30 mL/min/{1.73_m2} — ABNORMAL LOW (ref >=60)
Globulin: 2.4 g/dL (calc) (ref 1.9–3.7)
Glucose, Bld: 97 mg/dL (ref 65–99)
Potassium: 4 mmol/L (ref 3.5–5.3)
Sodium: 143 mmol/L (ref 135–146)
Total Bilirubin: 0.5 mg/dL (ref 0.2–1.2)
Total Protein: 6.6 g/dL (ref 6.1–8.1)

## 2019-06-21 LAB — CBC
HCT: 45.3 % — ABNORMAL HIGH (ref 35.0–45.0)
Hemoglobin: 15 g/dL (ref 11.7–15.5)
MCH: 30.5 pg (ref 27.0–33.0)
MCHC: 33.1 g/dL (ref 32.0–36.0)
MCV: 92.1 fL (ref 80.0–100.0)
MPV: 9.6 fL (ref 7.5–12.5)
Platelets: 336 10*3/uL (ref 140–400)
RBC: 4.92 10*6/uL (ref 3.80–5.10)
RDW: 12.7 % (ref 11.0–15.0)
WBC: 8.4 10*3/uL (ref 3.8–10.8)

## 2019-06-21 LAB — TSH: TSH: 2.02 mIU/L (ref 0.40–4.50)

## 2019-06-22 ENCOUNTER — Other Ambulatory Visit: Payer: Self-pay

## 2019-06-22 DIAGNOSIS — G8929 Other chronic pain: Secondary | ICD-10-CM

## 2019-06-22 MED ORDER — HYDROCODONE-ACETAMINOPHEN 7.5-325 MG PO TABS: 1.0000 | ORAL_TABLET | Freq: Three times a day (TID) | ORAL | 0 refills | Status: DC

## 2019-06-23 ENCOUNTER — Other Ambulatory Visit: Payer: Self-pay | Admitting: *Deleted

## 2019-06-23 DIAGNOSIS — N1832 Chronic kidney disease, stage 3b: Secondary | ICD-10-CM

## 2019-06-23 DIAGNOSIS — Z6833 Body mass index (BMI) 33.0-33.9, adult: Secondary | ICD-10-CM

## 2019-07-10 ENCOUNTER — Ambulatory Visit (INDEPENDENT_AMBULATORY_CARE_PROVIDER_SITE_OTHER): Payer: Medicare HMO | Admitting: Family Medicine

## 2019-07-10 ENCOUNTER — Encounter: Payer: Self-pay | Admitting: Family Medicine

## 2019-07-10 ENCOUNTER — Other Ambulatory Visit: Payer: Self-pay

## 2019-07-10 VITALS — BP 128/84 | HR 62 | Ht 63.0 in | Wt 183.0 lb

## 2019-07-10 DIAGNOSIS — N1832 Chronic kidney disease, stage 3b: Secondary | ICD-10-CM | POA: Diagnosis not present

## 2019-07-10 DIAGNOSIS — F321 Major depressive disorder, single episode, moderate: Secondary | ICD-10-CM

## 2019-07-10 DIAGNOSIS — F39 Unspecified mood [affective] disorder: Secondary | ICD-10-CM | POA: Diagnosis not present

## 2019-07-10 MED ORDER — ARIPIPRAZOLE 5 MG PO TABS: 5.0000 mg | ORAL_TABLET | Freq: Every day | ORAL | 0 refills | Status: DC

## 2019-07-10 NOTE — Assessment & Plan Note (Signed)
She does have an upcoming appointment with Dr. Sherlie Ban with nephrology on Friday for evaluation and consultation.

## 2019-07-10 NOTE — Patient Instructions (Signed)
Keep next appt.

## 2019-07-10 NOTE — Progress Notes (Signed)
She reports that she doesn't feel that the medication has made any improvement and actually feels that her sxs are getting worse. She questions whether or not she could be Bipolar.

## 2019-07-10 NOTE — Assessment & Plan Note (Signed)
Mood disorder questionnaire score of 11, rates as a moderate problem, positive family history.  No prior diagnosis.  This is considered a positive screening for possible bipolar disorder.  This is not a formal diagnosis but we did discuss an option of trying a mood stabilizer to see if she feels like this is more helpful organic continue with the Cymbalta for now and we can always taper wean later.  Did go ahead and remove Wellbutrin from her medication list.  We will start with 5 mg of Abilify.  Follow-up in 3 to 4 weeks.

## 2019-07-10 NOTE — Progress Notes (Addendum)
Established Patient Office Visit  Subjective:  Patient ID: Tracy Massey, female    DOB: 06-07-1950  Age: 69 y.o. MRN: [id number]  CC:  Chief Complaint  Patient presents with  . mood    HPI Tracy Massey presents for follow-up depression.  Last saw her about 3 weeks ago.  She felt like her Cymbalta was not working that well and she was currently taking 60 mg.  She was also on Wellbutrin 300 mg.  She was really struggling with motivation and severe fatigue and irritability.  Decided to taper her off the Wellbutrin because she felt like it really was not helping.  She says since being off of the Wellbutrin she really has not noticed any worsening or improvement in her symptoms she just really feels like it was not helpful at all she still taking 60 mg of Cymbalta.  She actually wondered if she could have bipolar disorder.  She has had episodes over her lifetime where she just cannot sit still.  More recently she said she has cleaned her entire house to the point that she will stay up all night doing it.  She remembers her grandmother likely having bipolar where she would go out and spend money irrationally but would also be very depressed.  And she has a granddaughter who has also been diagnosed with bipolar disorder.  Past Medical History:  Diagnosis Date  . Asthma   . Diverticulosis   . Hyperlipidemia   . Hypertension   . Menopausal syndrome   . Neck pain, chronic     Past Surgical History:  Procedure Laterality Date  . ABDOMINAL HYSTERECTOMY  1986   TAH w/o BSO/endometriosis  . APPENDECTOMY  1983  . CHOLECYSTECTOMY  1995   lap  . HAND SURGERY    . HEEL SPUR SURGERY  1996   LT foot  . KNEE ARTHROSCOPY  1983   RT knee and LT knee 1986, 1990  . TUBAL LIGATION     bilateral    Family History  Problem Relation Age of Onset  . Hypertension Mother   . Clotting disorder Mother   . Cancer Father 34       throat/died  . Hypertension Daughter   . Hypertension  Brother   . Emphysema Paternal Grandfather     Social History   Socioeconomic History  . Marital status: Married    Spouse name: Not on file  . Number of children: Not on file  . Years of education: Not on file  . Highest education level: Not on file  Occupational History  . Not on file  Tobacco Use  . Smoking status: Never Smoker  . Smokeless tobacco: Never Used  Substance and Sexual Activity  . Alcohol use: Yes    Comment: rare  . Drug use: No  . 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  Other Topics Concern  . Not on file  Social History Narrative  . Not on file   Social Determinants of Health   Financial Resource Strain:   . Difficulty of Paying Living Expenses:   Food Insecurity:   . Worried About Programme researcher, broadcasting/film/video in the Last Year:   . Barista in the Last Year:   Transportation Needs:   . Freight forwarder (Medical):   Marland Kitchen Lack of Transportation (Non-Medical):   Physical Activity:   . Days of Exercise  per Week:   . Minutes of Exercise per Session:   Stress:   . Feeling of Stress :   Social Connections:   . Frequency of Communication with Friends and Family:   . Frequency of Social Gatherings with Friends and Family:   . Attends Religious Services:   . Active Member of Clubs or Organizations:   . Attends Archivist Meetings:   Marland Kitchen Marital Status:   Intimate Partner Violence:   . Fear of Current or Ex-Partner:   . Emotionally Abused:   Marland Kitchen Physically Abused:   . Sexually Abused:     Outpatient Medications Prior to Visit  Medication Sig Dispense Refill  . amLODipine (NORVASC) 5 MG tablet TAKE 1 TABLET (5 MG TOTAL) BY MOUTH DAILY. 90 tablet 1  . aspirin EC 81 MG tablet Take 81 mg by mouth daily.    Marland Kitchen atorvastatin (LIPITOR) 10 MG tablet Take 1 tablet (10 mg total) by mouth at bedtime. 90 tablet 3  . DULoxetine (CYMBALTA) 60 MG capsule Take 1  capsule (60 mg total) by mouth daily. 90 capsule 1  . HYDROcodone-acetaminophen (NORCO) 7.5-325 MG tablet Take 1 tablet by mouth every 8 (eight) hours. 90 tablet 0  . lisinopril (ZESTRIL) 40 MG tablet Take 1 tablet (40 mg total) by mouth daily. 90 tablet 1  . metoprolol succinate (TOPROL-XL) 25 MG 24 hr tablet TAKE 1 TABLET (25 MG TOTAL) BY MOUTH DAILY. 90 tablet 1  . omeprazole (PRILOSEC) 40 MG capsule Take 1 capsule (40 mg total) by mouth daily. 30 capsule 3  . topiramate (TOPAMAX) 25 MG tablet Take 1 tablet (25 mg total) by mouth 2 (two) times daily. 60 tablet 1  . zolpidem (AMBIEN) 10 MG tablet Take 0.5 tablets (5 mg total) by mouth at bedtime as needed for sleep. 45 tablet 1  . buPROPion (WELLBUTRIN XL) 150 MG 24 hr tablet Take 1 tablet (150 mg total) by mouth daily for 10 days, THEN 1 tablet (150 mg total) every other day for 10 days. 15 tablet 0   No facility-administered medications prior to visit.    Allergies  Allergen Reactions  . Other Anaphylaxis    Prescoline  . Keflex [Cephalexin] Swelling    Hand swelling/redness  . Iodinated Diagnostic Agents     Pt reports that she recently had an MRI and they used dye she didn't have a reaction  . Metformin And Related Nausea And Vomiting  . Latex Rash  . Penicillins Rash  . Sulfa Antibiotics Nausea And Vomiting    ROS Review of Systems    Objective:    Physical Exam  Constitutional: She is oriented to person, place, and time. She appears well-developed and well-nourished.  HENT:  Head: Normocephalic and atraumatic.  Cardiovascular: Normal rate, regular rhythm and normal heart sounds.  Pulmonary/Chest: Effort normal and breath sounds normal.  Neurological: She is alert and oriented to person, place, and time.  Skin: Skin is warm and dry.  Psychiatric: She has a normal mood and affect. Her behavior is normal.    BP 128/84   Pulse 62   Ht 5\' 3"  (1.6 m)   Wt 183 lb (83 kg)   SpO2 98%   BMI 32.42 kg/m  Wt Readings from  Last 3 Encounters:  07/10/19 183 lb (83 kg)  06/20/19 183 lb (83 kg)  05/22/19 187 lb (84.8 kg)     There are no preventive care reminders to display for this patient.  There are no preventive care  reminders to display for this patient.  Lab Results  Component Value Date   TSH 2.02 06/20/2019   Lab Results  Component Value Date   WBC 8.4 06/20/2019   HGB 15.0 06/20/2019   HCT 45.3 (H) 06/20/2019   MCV 92.1 06/20/2019   PLT 336 06/20/2019   Lab Results  Component Value Date   NA 143 06/20/2019   K 4.0 06/20/2019   CO2 26 06/20/2019   GLUCOSE 97 06/20/2019   BUN 18 06/20/2019   CREATININE 1.68 (H) 06/20/2019   BILITOT 0.5 06/20/2019   ALKPHOS 85 06/01/2011   AST 21 06/20/2019   ALT 25 06/20/2019   PROT 6.6 06/20/2019   ALBUMIN 4.1 06/01/2011   CALCIUM 9.9 06/20/2019   Lab Results  Component Value Date   CHOL 174 07/13/2018   Lab Results  Component Value Date   HDL 55 07/13/2018   Lab Results  Component Value Date   LDLCALC 98 07/13/2018   Lab Results  Component Value Date   TRIG 113 07/13/2018   Lab Results  Component Value Date   CHOLHDL 3.2 07/13/2018   Lab Results  Component Value Date   HGBA1C 5.4 05/22/2019      Assessment & Plan:   Problem List Items Addressed This Visit      Genitourinary   CKD stage G3b/A2, GFR 30-44 and albumin creatinine ratio 30-299 mg/g    She does have an upcoming appointment with Dr. Sherlie Ban with nephrology on Friday for evaluation and consultation.        Other   Mood disorder (HCC) - Primary    Mood disorder questionnaire score of 11, rates as a moderate problem, positive family history.  No prior diagnosis.  This is considered a positive screening for possible bipolar disorder.  This is not a formal diagnosis but we did discuss an option of trying a mood stabilizer to see if she feels like this is more helpful organic continue with the Cymbalta for now and we can always taper wean later.  Did go ahead and  remove Wellbutrin from her medication list.  We will start with 5 mg of Abilify.  Follow-up in 3 to 4 weeks.      Depression, major, single episode, moderate (HCC)    See note below under mood disorder.         PHQ-9 score of 15 and GAD-7 score of 6.  Similar to previous scores.  Rates feeling down or depressed more than half the time and little interest or pleasure in doing things nearly every day.  Meds ordered this encounter  Medications  . ARIPiprazole (ABILIFY) 5 MG tablet    Sig: Take 1 tablet (5 mg total) by mouth daily.    Dispense:  30 tablet    Refill:  0    Follow-up: Return in about 4 weeks (around 08/07/2019) for New start medication.   Time spent 22 minutes in encounter.  Nani Gasser, MD

## 2019-07-10 NOTE — Assessment & Plan Note (Signed)
See note below under mood disorder.

## 2019-07-11 ENCOUNTER — Ambulatory Visit (INDEPENDENT_AMBULATORY_CARE_PROVIDER_SITE_OTHER): Payer: Medicare HMO

## 2019-07-11 ENCOUNTER — Ambulatory Visit (INDEPENDENT_AMBULATORY_CARE_PROVIDER_SITE_OTHER): Payer: Medicare HMO | Admitting: Sports Medicine

## 2019-07-11 DIAGNOSIS — M4807 Spinal stenosis, lumbosacral region: Secondary | ICD-10-CM | POA: Diagnosis not present

## 2019-07-11 DIAGNOSIS — M51369 Other intervertebral disc degeneration, lumbar region without mention of lumbar back pain or lower extremity pain: Secondary | ICD-10-CM | POA: Insufficient documentation

## 2019-07-11 DIAGNOSIS — M5136 Other intervertebral disc degeneration, lumbar region: Secondary | ICD-10-CM | POA: Diagnosis not present

## 2019-07-11 DIAGNOSIS — M545 Low back pain: Secondary | ICD-10-CM | POA: Diagnosis not present

## 2019-07-11 MED ORDER — OXYCODONE-ACETAMINOPHEN 10-325 MG PO TABS: 1.0000 | ORAL_TABLET | Freq: Three times a day (TID) | ORAL | 0 refills | Status: DC | PRN

## 2019-07-11 NOTE — Progress Notes (Signed)
    Procedures performed today:    None.  Independent interpretation of notes and tests performed by another provider:   None.  Brief History, Exam, Impression, and Recommendations:    Lumbar degenerative disc disease Vena Austria returns, typically she does well with occasional trigger point injections. More recently she has had a severe worsening of axial low back pain with radiation into the tailbone. Worse with standing, better with sitting, no bowel or bladder dysfunction, saddle numbness, constitutional symptoms, no trauma. Getting updated x-rays, she does have a contract for narcotics with her PCP, I did discuss a brief course of high-dose oxycodone while we await imaging. She has failed greater than 6 weeks of conservative measures, adding updated x-rays, lumbar spine MRI for epidural planning. Adding a 5-day course of Percocet tens.    ___________________________________________ Ihor Austin. Benjamin Stain, M.D., ABFM., CAQSM. Primary Care and Sports Medicine [facility]  Adjunct Instructor of Family Medicine  [employer]

## 2019-07-11 NOTE — Assessment & Plan Note (Signed)
Tracy Massey returns, typically she does well with occasional trigger point injections. More recently she has had a severe worsening of axial low back pain with radiation into the tailbone. Worse with standing, better with sitting, no bowel or bladder dysfunction, saddle numbness, constitutional symptoms, no trauma. Getting updated x-rays, she does have a contract for narcotics with her PCP, I did discuss a brief course of high-dose oxycodone while we await imaging. She has failed greater than 6 weeks of conservative measures, adding updated x-rays, lumbar spine MRI for epidural planning. Adding a 5-day course of Percocet tens.

## 2019-07-17 ENCOUNTER — Other Ambulatory Visit: Payer: Self-pay | Admitting: Sports Medicine

## 2019-07-17 ENCOUNTER — Telehealth: Payer: Self-pay

## 2019-07-17 ENCOUNTER — Ambulatory Visit (INDEPENDENT_AMBULATORY_CARE_PROVIDER_SITE_OTHER): Payer: Medicare HMO

## 2019-07-17 ENCOUNTER — Encounter: Payer: Self-pay | Admitting: Sports Medicine

## 2019-07-17 ENCOUNTER — Other Ambulatory Visit: Payer: Self-pay

## 2019-07-17 DIAGNOSIS — W19XXXA Unspecified fall, initial encounter: Secondary | ICD-10-CM | POA: Diagnosis not present

## 2019-07-17 DIAGNOSIS — S299XXA Unspecified injury of thorax, initial encounter: Secondary | ICD-10-CM | POA: Diagnosis not present

## 2019-07-17 DIAGNOSIS — R0781 Pleurodynia: Secondary | ICD-10-CM

## 2019-07-17 DIAGNOSIS — M4807 Spinal stenosis, lumbosacral region: Secondary | ICD-10-CM | POA: Diagnosis not present

## 2019-07-17 DIAGNOSIS — S62346A Nondisplaced fracture of base of fifth metacarpal bone, right hand, initial encounter for closed fracture: Secondary | ICD-10-CM

## 2019-07-17 DIAGNOSIS — M5136 Other intervertebral disc degeneration, lumbar region: Secondary | ICD-10-CM

## 2019-07-17 DIAGNOSIS — S62316A Displaced fracture of base of fifth metacarpal bone, right hand, initial encounter for closed fracture: Secondary | ICD-10-CM | POA: Insufficient documentation

## 2019-07-17 DIAGNOSIS — S62356A Nondisplaced fracture of shaft of fifth metacarpal bone, right hand, initial encounter for closed fracture: Secondary | ICD-10-CM | POA: Diagnosis not present

## 2019-07-17 DIAGNOSIS — M545 Low back pain: Secondary | ICD-10-CM | POA: Diagnosis not present

## 2019-07-17 NOTE — Telephone Encounter (Signed)
Tracy Massey left a message stating she had a fall over the weekend. She is requesting a xray of right hand and left ribs. Please advise.

## 2019-07-17 NOTE — Telephone Encounter (Signed)
Patient has appt with T tomorrow. He was agreeable to order R hand x-ray and L rib x-ray. Patient having these done today when she goes to her MRI

## 2019-07-18 ENCOUNTER — Encounter: Payer: Self-pay | Admitting: Sports Medicine

## 2019-07-18 ENCOUNTER — Ambulatory Visit (INDEPENDENT_AMBULATORY_CARE_PROVIDER_SITE_OTHER): Payer: Medicare HMO | Admitting: Sports Medicine

## 2019-07-18 DIAGNOSIS — S62346A Nondisplaced fracture of base of fifth metacarpal bone, right hand, initial encounter for closed fracture: Secondary | ICD-10-CM

## 2019-07-18 NOTE — Assessment & Plan Note (Signed)
This pleasant 69 year old female had a fall, she tripped over her foot, fell on her hand and her ribs, rib series x-rays were negative for acute fracture, she did have a fracture, nondisplaced and nonangulated to the base of the fifth metacarpal. Today I placed an ulnar gutter splint, I like to see her back in a month, x-rays before visit. She does have a pain contract with Dr. Linford Arnold.

## 2019-07-18 NOTE — Progress Notes (Signed)
    Procedures performed today:    Ulnar gutter splint applied.  Independent interpretation of notes and tests performed by another provider:   X-rays personally reviewed and show a nondisplaced, nonangulated fracture through the base of the fifth metacarpal.  Brief History, Exam, Impression, and Recommendations:    Closed fracture of base of fifth metacarpal bone of right hand This pleasant 69 year old female had a fall, she tripped over her foot, fell on her hand and her ribs, rib series x-rays were negative for acute fracture, she did have a fracture, nondisplaced and nonangulated to the base of the fifth metacarpal. Today I placed an ulnar gutter splint, I like to see her back in a month, x-rays before visit. She does have a pain contract with Dr. Linford Arnold.    ___________________________________________ Ihor Austin. Benjamin Stain, M.D., ABFM., CAQSM. Primary Care and Sports Medicine [facility]  Adjunct Instructor of Family Medicine  [employer]

## 2019-07-19 ENCOUNTER — Telehealth: Payer: Self-pay | Admitting: Family Medicine

## 2019-07-19 DIAGNOSIS — S62346A Nondisplaced fracture of base of fifth metacarpal bone, right hand, initial encounter for closed fracture: Secondary | ICD-10-CM

## 2019-07-19 NOTE — Telephone Encounter (Signed)
Pt called and needs a prescription for a bone stimulator called  "Exogen Bone Healing Ultrasound System" and she needs it sent to [facility] at phone- #[phone] in [place]

## 2019-07-19 NOTE — Telephone Encounter (Signed)
Sure.  Orders placed but generally this needs to x-rays at least 90 days apart, it is likely too early for an exogen bone stimulator but we should give it a try.

## 2019-07-20 ENCOUNTER — Other Ambulatory Visit: Payer: Self-pay | Admitting: Family Medicine

## 2019-07-20 DIAGNOSIS — I10 Essential (primary) hypertension: Secondary | ICD-10-CM

## 2019-07-20 DIAGNOSIS — I739 Peripheral vascular disease, unspecified: Secondary | ICD-10-CM

## 2019-07-20 NOTE — Telephone Encounter (Signed)
The form that printed for this is in referral format and they require ins information, office notes, and xray reports so this should probable be handled like a referral.

## 2019-07-21 NOTE — Telephone Encounter (Signed)
Pt is going to have to wait the 90 days for the second xray.  Please let her know.

## 2019-07-21 NOTE — Telephone Encounter (Signed)
Per [facility]- Referral Coordinator, She states pt has not got her 2nd Xray. Referral states that she needs 2 xrays 90 days apart

## 2019-07-24 ENCOUNTER — Other Ambulatory Visit: Payer: Self-pay | Admitting: *Deleted

## 2019-07-24 MED ORDER — TOPIRAMATE 25 MG PO TABS: 25.0000 mg | ORAL_TABLET | Freq: Two times a day (BID) | ORAL | 1 refills | Status: DC

## 2019-07-26 ENCOUNTER — Other Ambulatory Visit: Payer: Self-pay

## 2019-07-26 DIAGNOSIS — G8929 Other chronic pain: Secondary | ICD-10-CM

## 2019-07-26 MED ORDER — HYDROCODONE-ACETAMINOPHEN 7.5-325 MG PO TABS: 1.0000 | ORAL_TABLET | Freq: Three times a day (TID) | ORAL | 0 refills | Status: DC

## 2019-07-28 ENCOUNTER — Other Ambulatory Visit: Payer: Self-pay | Admitting: *Deleted

## 2019-07-28 DIAGNOSIS — I10 Essential (primary) hypertension: Secondary | ICD-10-CM

## 2019-07-28 MED ORDER — LISINOPRIL 40 MG PO TABS: 40.0000 mg | ORAL_TABLET | Freq: Every day | ORAL | 1 refills | Status: AC

## 2019-07-31 NOTE — Telephone Encounter (Signed)
Called patient she is aware of needing new xray in July - CF

## 2019-08-15 ENCOUNTER — Ambulatory Visit (INDEPENDENT_AMBULATORY_CARE_PROVIDER_SITE_OTHER): Payer: Medicare HMO | Admitting: Sports Medicine

## 2019-08-15 ENCOUNTER — Ambulatory Visit (INDEPENDENT_AMBULATORY_CARE_PROVIDER_SITE_OTHER): Payer: Medicare HMO

## 2019-08-15 ENCOUNTER — Other Ambulatory Visit: Payer: Self-pay

## 2019-08-15 DIAGNOSIS — S62346D Nondisplaced fracture of base of fifth metacarpal bone, right hand, subsequent encounter for fracture with routine healing: Secondary | ICD-10-CM

## 2019-08-15 DIAGNOSIS — S62306A Unspecified fracture of fifth metacarpal bone, right hand, initial encounter for closed fracture: Secondary | ICD-10-CM | POA: Diagnosis not present

## 2019-08-15 DIAGNOSIS — M2061 Acquired deformities of toe(s), unspecified, right foot: Secondary | ICD-10-CM | POA: Diagnosis not present

## 2019-08-15 DIAGNOSIS — R531 Weakness: Secondary | ICD-10-CM | POA: Diagnosis not present

## 2019-08-15 DIAGNOSIS — S62346A Nondisplaced fracture of base of fifth metacarpal bone, right hand, initial encounter for closed fracture: Secondary | ICD-10-CM | POA: Diagnosis not present

## 2019-08-15 DIAGNOSIS — M2060 Acquired deformities of toe(s), unspecified, unspecified foot: Secondary | ICD-10-CM | POA: Insufficient documentation

## 2019-08-15 NOTE — Assessment & Plan Note (Signed)
Tracy Massey is also approximately a month post fracture of the base of the right fifth metacarpal. X-rays do show some signs of healing and she has no pain over the fracture. I think she should wear a Velcro brace for the next couple of weeks and return to see me as needed for this.

## 2019-08-15 NOTE — Progress Notes (Signed)
    Procedures performed today:    None.  Independent interpretation of notes and tests performed by another provider:   X-rays personally reviewed, there is only minimal angulation of the fracture at the base of the fifth metacarpal, there does appear to be some bony resorption along the fracture lines consistent with early healing.  Brief History, Exam, Impression, and Recommendations:    Problem with right great toe Tracy Massey is a pleasant 69 year old female, she has been tripping over her right great toe, she does endorse significant difficulty with dorsiflexion of the hallux. On exam she does have 0/5 strength to dorsiflexion/EHL. I can feel the EHL along its course over the toe. This does warrant further evaluation with advanced imaging and further evaluation of the extensor hallucis longus including deep peroneal nerve but she would like this done through our podiatrist. I am going to just get her teed up with x-rays.  Closed fracture of base of fifth metacarpal bone of right hand Tracy Massey is also approximately a month post fracture of the base of the right fifth metacarpal. X-rays do show some signs of healing and she has no pain over the fracture. I think she should wear a Velcro brace for the next couple of weeks and return to see me as needed for this.    ___________________________________________ Ihor Austin. Benjamin Stain, M.D., ABFM., CAQSM. Primary Care and Sports Medicine [facility]  Adjunct Instructor of Family Medicine  [employer]

## 2019-08-15 NOTE — Assessment & Plan Note (Signed)
Tracy Massey is a pleasant 69 year old female, she has been tripping over her right great toe, she does endorse significant difficulty with dorsiflexion of the hallux. On exam she does have 0/5 strength to dorsiflexion/EHL. I can feel the EHL along its course over the toe. This does warrant further evaluation with advanced imaging and further evaluation of the extensor hallucis longus including deep peroneal nerve but she would like this done through our podiatrist. I am going to just get her teed up with x-rays.

## 2019-08-17 ENCOUNTER — Encounter: Payer: Self-pay | Admitting: Family Medicine

## 2019-08-17 ENCOUNTER — Ambulatory Visit (INDEPENDENT_AMBULATORY_CARE_PROVIDER_SITE_OTHER): Payer: Medicare HMO | Admitting: Family Medicine

## 2019-08-17 ENCOUNTER — Other Ambulatory Visit: Payer: Self-pay

## 2019-08-17 VITALS — BP 181/64 | HR 59 | Ht 63.0 in | Wt 184.0 lb

## 2019-08-17 DIAGNOSIS — M2061 Acquired deformities of toe(s), unspecified, right foot: Secondary | ICD-10-CM | POA: Diagnosis not present

## 2019-08-17 DIAGNOSIS — F39 Unspecified mood [affective] disorder: Secondary | ICD-10-CM

## 2019-08-17 DIAGNOSIS — G8929 Other chronic pain: Secondary | ICD-10-CM

## 2019-08-17 MED ORDER — DULOXETINE HCL 60 MG PO CPEP: 60.0000 mg | ORAL_CAPSULE | Freq: Every day | ORAL | 0 refills | Status: DC

## 2019-08-17 MED ORDER — DULOXETINE HCL 60 MG PO CPEP: 60.0000 mg | ORAL_CAPSULE | Freq: Every day | ORAL | 2 refills | Status: AC

## 2019-08-17 MED ORDER — HYDROCODONE-ACETAMINOPHEN 7.5-325 MG PO TABS: 1.0000 | ORAL_TABLET | Freq: Three times a day (TID) | ORAL | 0 refills | Status: DC

## 2019-08-17 MED ORDER — ARIPIPRAZOLE 10 MG PO TABS: 10.0000 mg | ORAL_TABLET | Freq: Every day | ORAL | 2 refills | Status: DC

## 2019-08-17 NOTE — Assessment & Plan Note (Signed)
Let me know she is being referred to podiatry.  She is unable to dorsiflex that toe at all.  She is had significant damage to the tendon and is causing her to trip and fall at times.
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
## 2019-08-17 NOTE — Assessment & Plan Note (Addendum)
Indication for chronic opioid:DDD Medication and dose:Norco 7.5/325. 1 tab every 8 hours as needed # pills per month: 90 Last UDS date:05/2019 Opioid Treatment Agreement signed (Y/N):05/22/2019 Opioid Treatment Agreement last reviewed with patient:Yes NCCSRS reviewed this encounter (include red flags):Yes Additional medications: Cymbalta

## 2019-08-17 NOTE — Assessment & Plan Note (Addendum)
So far doing really well on 5 mg of Abilify will try increasing to 10 mg.  New prescription sent to pharmacy will follow up in 2 to 3 months.  Her PHQ-9 score went from 15 down to 3.  Which is a significant improvement and again she has been really happy with this regimen.  We also discussed continuing her Cymbalta.  She did run out for couple of days and wondered if she really needed to refill it but I think it is also helping with some of her chronic pain management.  In fact she said she noticed that she did not have to have trigger point injections this year which is the first time in a long time so she really does feel like it is been helpful so we will keep that on board.

## 2019-08-22 DIAGNOSIS — N183 Chronic kidney disease, stage 3 unspecified: Secondary | ICD-10-CM | POA: Diagnosis not present

## 2019-08-22 DIAGNOSIS — R9431 Abnormal electrocardiogram [ECG] [EKG]: Secondary | ICD-10-CM | POA: Diagnosis not present

## 2019-08-22 DIAGNOSIS — E6609 Other obesity due to excess calories: Secondary | ICD-10-CM | POA: Diagnosis not present

## 2019-08-22 DIAGNOSIS — E782 Mixed hyperlipidemia: Secondary | ICD-10-CM | POA: Diagnosis not present

## 2019-08-22 DIAGNOSIS — I10 Essential (primary) hypertension: Secondary | ICD-10-CM | POA: Diagnosis not present

## 2019-08-22 DIAGNOSIS — Z6833 Body mass index (BMI) 33.0-33.9, adult: Secondary | ICD-10-CM | POA: Diagnosis not present

## 2019-09-01 ENCOUNTER — Telehealth: Payer: Self-pay | Admitting: *Deleted

## 2019-09-01 ENCOUNTER — Encounter: Payer: Self-pay | Admitting: Podiatry

## 2019-09-01 ENCOUNTER — Other Ambulatory Visit: Payer: Self-pay

## 2019-09-01 ENCOUNTER — Ambulatory Visit: Payer: Medicare HMO | Admitting: Podiatry

## 2019-09-01 VITALS — Temp 98.5°F

## 2019-09-01 DIAGNOSIS — M2061 Acquired deformities of toe(s), unspecified, right foot: Secondary | ICD-10-CM

## 2019-09-01 DIAGNOSIS — R0989 Other specified symptoms and signs involving the circulatory and respiratory systems: Secondary | ICD-10-CM | POA: Diagnosis not present

## 2019-09-01 DIAGNOSIS — T148XXA Other injury of unspecified body region, initial encounter: Secondary | ICD-10-CM

## 2019-09-01 NOTE — Telephone Encounter (Signed)
Orders for MRI given to L. Cox, CMA for pre-cert and faxed to [facility]. Faxed orders to [facility].

## 2019-09-01 NOTE — Progress Notes (Signed)
Subjective:   Patient ID: Tracy Massey, female   DOB: 69 y.o.   MRN: [id number]   HPI 69 year old female presents the office today for concerns of her right toe not being able to pop.  This is been ongoing since 2019.  She states that because she is unable to bend her toe but she is a fall risk and she is following any trips regularly.  This started after she broke her ankle.  She reports that she was in a cast after she broke her ankle did not require surgery for the ankle.  She went to physical therapy to see if this will help improve the range of motion of the toe but was not helpful.  She has no other concerns today.  Review of Systems  All other systems reviewed and are negative.  Past Medical History:  Diagnosis Date  . Asthma   . Diverticulosis   . Hyperlipidemia   . Hypertension   . Menopausal syndrome   . Neck pain, chronic     Past Surgical History:  Procedure Laterality Date  . ABDOMINAL HYSTERECTOMY  1986   TAH w/o BSO/endometriosis  . APPENDECTOMY  1983  . CHOLECYSTECTOMY  1995   lap  . HAND SURGERY    . HEEL SPUR SURGERY  1996   LT foot  . KNEE ARTHROSCOPY  1983   RT knee and LT knee 1986, 1990  . TUBAL LIGATION     bilateral     Current Outpatient Medications:  .  ARIPiprazole (ABILIFY) 10 MG tablet, Take 1 tablet (10 mg total) by mouth daily., Disp: 30 tablet, Rfl: 2 .  atorvastatin (LIPITOR) 10 MG tablet, TAKE 1 TABLET AT BEDTIME, Disp: 90 tablet, Rfl: 3 .  DULoxetine (CYMBALTA) 60 MG capsule, Take 1 capsule (60 mg total) by mouth daily., Disp: 90 capsule, Rfl: 2 .  HYDROcodone-acetaminophen (NORCO) 7.5-325 MG tablet, Take 1 tablet by mouth every 8 (eight) hours., Disp: 90 tablet, Rfl: 0 .  lisinopril (ZESTRIL) 40 MG tablet, Take 1 tablet (40 mg total) by mouth daily., Disp: 90 tablet, Rfl: 1 .  metoprolol succinate (TOPROL-XL) 25 MG 24 hr tablet, TAKE 1 TABLET EVERY DAY, Disp: 90 tablet, Rfl: 1 .  Phentermine HCl (LOMAIRA) 8 MG TABS, 1 tab po 30 min  before breakfast and 1 tab po at 2 pm daily, Disp: , Rfl:  .  Prenatal Vit-Fe Fumarate-FA (PRENATAL VITAMIN PO), Take by mouth., Disp: , Rfl:  .  topiramate (TOPAMAX) 25 MG tablet, Take 1 tablet (25 mg total) by mouth 2 (two) times daily., Disp: 180 tablet, Rfl: 1 .  zolpidem (AMBIEN) 10 MG tablet, Take 0.5 tablets (5 mg total) by mouth at bedtime as needed for sleep., Disp: 45 tablet, Rfl: 1  Allergies  Allergen Reactions  . Other Anaphylaxis    Prescoline  . Keflex [Cephalexin] Swelling    Hand swelling/redness  . Iodinated Diagnostic Agents     Pt reports that she recently had an MRI and they used dye she didn't have a reaction  . Metformin And Related Nausea And Vomiting  . Latex Rash  . Penicillins Rash  . Sulfa Antibiotics Nausea And Vomiting         Objective:  Physical Exam  General: AAO x3, NAD  Dermatological: Chronic purple discoloration of the skin bilaterally.  She reports she has a family history of this.  Vascular: Dorsalis Pedis artery and Posterior Tibial artery pedal pulses are 2/4 bilateral with slightly delayed cap refill  time today.  No varicosities and no lower extremity edema present bilateral. There is no pain with calf compression, swelling, warmth, erythema.   Neruologic: Grossly intact via light touch bilateral.   Musculoskeletal: She is able to plantarflex the hallux but there is no active dorsiflexion of the right hallux.  I am able to put that MPJ through range of motion manually with any crepitation or restriction.  Difficult to palpate the extensor tendons on the right foot there is a mild swelling to the area limits this.  Muscular strength 5/5 in all groups tested bilateral.     Assessment:   Right hallux loss of dorsiflexion     Plan:  -Treatment options discussed including all alternatives, risks, and complications -Etiology of symptoms were discussed -I reviewed the x-rays with her.  Small avulsion area on the lateral aspect the proximal  phalanx but no other evidence of acute fracture.  Old fracture of the fifth metatarsal. -I discussed with her possible etiology of this.  Discussed with her possible extensor injury I would order an MRI to further evaluate this.  Also concerned that she was in a Deep Peroneal Injury.  If MRI Is Negative Will Order Nerve Conduction Test.  I Discussed That Ultimately Surgical Intervention Will Be Needed.  Discussed with Her Possible Repair of Extensor Tendon If Needed Although Most Likely First MPJ Arthrodesis.  Given the discoloration of the skin and mild decrease in cap refill time will order arterial studies in preparation for surgery.  She agrees with this has no further questions today.  For now want her to continue with the closed-end shoe with support to help prevent falls.  Trula Slade DPM

## 2019-09-01 NOTE — Patient Instructions (Signed)
I have ordered an MRI of your right foot and also a circulation test. If you do not hear for them about scheduling within the next 1 week, or you have any questions please give [place] a call at [phone].

## 2019-09-01 NOTE — Telephone Encounter (Signed)
Unable to leave a message on home phone the voicemail box has not been set up. I spoke to pt on her mobile phone and informed that [facility] was booking out to September and I offered testing in [place] and pt accepted. I told her we would still schedule the MRI in [place] for her convenience.

## 2019-09-01 NOTE — Telephone Encounter (Signed)
-----   Message from Vivi Barrack, DPM sent at 09/01/2019  1:55 PM EDT ----- Can you please order an MRI of the right foot to evaluate the EHL to rule out tear. This can be done in [place].   Also, can you order arterial studies? Thanks.

## 2019-09-01 NOTE — Telephone Encounter (Signed)
[facility] - Tracy Massey states they are booking out to September.

## 2019-09-06 ENCOUNTER — Telehealth: Payer: Self-pay | Admitting: *Deleted

## 2019-09-06 NOTE — Telephone Encounter (Signed)
Called and got the automated system and the procedure code 55015 does not require a prior authorization and reference number is [id number]. Tracy Massey

## 2019-09-08 ENCOUNTER — Telehealth: Payer: Medicare HMO | Admitting: Family Medicine

## 2019-09-17 ENCOUNTER — Other Ambulatory Visit: Payer: Self-pay

## 2019-09-17 ENCOUNTER — Ambulatory Visit (INDEPENDENT_AMBULATORY_CARE_PROVIDER_SITE_OTHER): Payer: Medicare HMO

## 2019-09-17 DIAGNOSIS — M2061 Acquired deformities of toe(s), unspecified, right foot: Secondary | ICD-10-CM | POA: Diagnosis not present

## 2019-09-17 DIAGNOSIS — T148XXA Other injury of unspecified body region, initial encounter: Secondary | ICD-10-CM | POA: Diagnosis not present

## 2019-09-17 DIAGNOSIS — M19071 Primary osteoarthritis, right ankle and foot: Secondary | ICD-10-CM | POA: Diagnosis not present

## 2019-09-20 ENCOUNTER — Other Ambulatory Visit: Payer: Self-pay

## 2019-09-20 ENCOUNTER — Ambulatory Visit (HOSPITAL_COMMUNITY)
Admission: RE | Admit: 2019-09-20 | Discharge: 2019-09-20 | Disposition: A | Discharge status: Home / Self Care | Payer: Medicare HMO | Source: Ambulatory Visit | Attending: Cardiology | Admitting: Cardiology

## 2019-09-20 DIAGNOSIS — R0989 Other specified symptoms and signs involving the circulatory and respiratory systems: Secondary | ICD-10-CM | POA: Diagnosis not present

## 2019-09-22 ENCOUNTER — Other Ambulatory Visit: Payer: Self-pay

## 2019-09-22 DIAGNOSIS — G8929 Other chronic pain: Secondary | ICD-10-CM

## 2019-09-22 MED ORDER — HYDROCODONE-ACETAMINOPHEN 7.5-325 MG PO TABS: 1.0000 | ORAL_TABLET | Freq: Three times a day (TID) | ORAL | 0 refills | Status: DC

## 2019-09-22 MED ORDER — ZOLPIDEM TARTRATE 10 MG PO TABS: 5.0000 mg | ORAL_TABLET | Freq: Every evening | ORAL | 1 refills | Status: AC | PRN

## 2019-09-29 ENCOUNTER — Telehealth: Payer: Self-pay | Admitting: *Deleted

## 2019-09-29 NOTE — Telephone Encounter (Signed)
-----   Message from Matthew R Wagoner, DPM sent at 09/29/2019 12:33 PM EDT ----- I tried to call to go over results but left VM to call back  The circulation test was abnormal. [place] to any surgery I would like for her to see [facility] for evaluation. Can you please put the referral in? The MRI did show arthritis but no tendon tear. She has a follow up to see me scheduled to further discuss but I want to go ahead and have her see vascular prior to any surgery.Thanks.  

## 2019-09-29 NOTE — Telephone Encounter (Signed)
Left message to call for results and instructions.

## 2019-10-03 NOTE — Telephone Encounter (Deleted)
-----   Message from Vivi Barrack, DPM sent at 09/29/2019 12:33 PM EDT ----- I tried to call to go over results but left VM to call back  The circulation test was abnormal. [place] to any surgery I would like for her to see [facility] for evaluation. Can you please put the referral in? The MRI did show arthritis but no tendon tear. She has a follow up to see me scheduled to further discuss but I want to go ahead and have her see vascular prior to any surgery.Thanks.

## 2019-10-03 NOTE — Telephone Encounter (Signed)
Left message requesting pt call for results. Mailed letter informing pt of Dr. Gabriel Rung orders for referral to [facility].

## 2019-10-06 ENCOUNTER — Other Ambulatory Visit: Payer: Self-pay

## 2019-10-06 ENCOUNTER — Ambulatory Visit (INDEPENDENT_AMBULATORY_CARE_PROVIDER_SITE_OTHER): Payer: Medicare HMO | Admitting: Sports Medicine

## 2019-10-06 DIAGNOSIS — M5136 Other intervertebral disc degeneration, lumbar region: Secondary | ICD-10-CM | POA: Diagnosis not present

## 2019-10-06 DIAGNOSIS — M51369 Other intervertebral disc degeneration, lumbar region without mention of lumbar back pain or lower extremity pain: Secondary | ICD-10-CM

## 2019-10-06 DIAGNOSIS — M5030 Other cervical disc degeneration, unspecified cervical region: Secondary | ICD-10-CM | POA: Diagnosis not present

## 2019-10-06 NOTE — Assessment & Plan Note (Signed)
Tracy Massey returns, she has worsening bilateral neck pain, upper back pain, we have not done her trigger point injection since January, repeated today. Return as needed for this. As before, she has baseline cervical facet arthritis worst C7-T1 level with multilevel cervical DDD that could potentially be a future interventional target should trigger point injections start to exhibit waning efficacy.

## 2019-10-06 NOTE — Assessment & Plan Note (Signed)
Tracy Massey returns, she has lumbar DDD, with central canal stenosis at L3-L4 as well as some facet arthritis. Ultimately we obtained an MRI, her pain is worse with standing consistent with spinal stenosis versus facet arthritis, slightly worse on the left so we are going to proceed with a left sided lumbar epidural at L3-L4 with Dr. Laurian Brim. Return to see me 1 month later to evaluate response, if insufficient improvement we will target her facets.

## 2019-10-06 NOTE — Telephone Encounter (Signed)
Left message informing pt I had mailed a letter with explanation of Dr. Gabriel Rung recommendations after reviewing the testing results.

## 2019-10-06 NOTE — Progress Notes (Signed)
    Procedures performed today:    Procedure: Injection of #6 trigger points along the left and right trapezius as well as rhomboideus major bilaterally. Consent obtained and verified. Time-out conducted. Noted no overlying erythema, induration, or other signs of local infection. Skin prepped in a sterile fashion. Topical analgesic spray: Ethyl chloride. Completed without difficulty. Meds: A total of 1 cc Kenalog 40, 5 cc lidocaine spread out between the 6 trigger points. Pain immediately improved suggesting accurate placement of the medication. Advised to call if fevers/chills, erythema, induration, drainage, or persistent bleeding.  Independent interpretation of notes and tests performed by another provider:   None.  Brief History, Exam, Impression, and Recommendations:    Lumbar degenerative disc disease Vena Austria returns, she has lumbar DDD, with central canal stenosis at L3-L4 as well as some facet arthritis. Ultimately we obtained an MRI, her pain is worse with standing consistent with spinal stenosis versus facet arthritis, slightly worse on the left so we are going to proceed with a left sided lumbar epidural at L3-L4 with Dr. Laurian Brim. Return to see me 1 month later to evaluate response, if insufficient improvement we will target her facets.  DDD (degenerative disc disease), cervical Vena Austria returns, she has worsening bilateral neck pain, upper back pain, we have not done her trigger point injection since January, repeated today. Return as needed for this. As before, she has baseline cervical facet arthritis worst C7-T1 level with multilevel cervical DDD that could potentially be a future interventional target should trigger point injections start to exhibit waning efficacy.    ___________________________________________ Ihor Austin. Benjamin Stain, M.D., ABFM., CAQSM. Primary Care and Sports Medicine [facility]  Adjunct Instructor of Family Medicine    [employer]

## 2019-10-09 ENCOUNTER — Telehealth: Payer: Self-pay | Admitting: Podiatry

## 2019-10-09 NOTE — Telephone Encounter (Signed)
Pt called stating she received a letter to call and get a referral for a vein and vascular

## 2019-10-09 NOTE — Telephone Encounter (Signed)
I informed pt of Dr. Gabriel Rung referral to [facility]. Faxed referral to [facility].

## 2019-10-12 ENCOUNTER — Other Ambulatory Visit: Payer: Self-pay

## 2019-10-12 MED ORDER — ARIPIPRAZOLE 10 MG PO TABS: 10.0000 mg | ORAL_TABLET | Freq: Every day | ORAL | 0 refills | Status: AC

## 2019-10-20 ENCOUNTER — Ambulatory Visit (INDEPENDENT_AMBULATORY_CARE_PROVIDER_SITE_OTHER): Payer: Medicare HMO | Admitting: Podiatry

## 2019-10-20 ENCOUNTER — Encounter: Payer: Self-pay | Admitting: Podiatry

## 2019-10-20 ENCOUNTER — Other Ambulatory Visit: Payer: Self-pay

## 2019-10-20 VITALS — Temp 97.2°F

## 2019-10-20 DIAGNOSIS — I739 Peripheral vascular disease, unspecified: Secondary | ICD-10-CM

## 2019-10-20 DIAGNOSIS — M2061 Acquired deformities of toe(s), unspecified, right foot: Secondary | ICD-10-CM

## 2019-10-20 DIAGNOSIS — R0989 Other specified symptoms and signs involving the circulatory and respiratory systems: Secondary | ICD-10-CM

## 2019-10-20 NOTE — Progress Notes (Signed)
Subjective: 69 year old female presents the office today for further evaluation given loss of dorsiflexion to the right hallux.  We had planned in the first MPJ arthrodesis however prior to this I ordered arterial studies  and referral to vascular surgery but she has not heard from them.  Otherwise she is doing well and she has no new concerns today.  Denies any fevers, chills, nausea, vomiting.   ---  MRI 09/18/2019 IMPRESSION: Small bone fragment off the lateral corner of the base of the proximal phalanx of the great toe compatible with remote avulsion injury. No acute bony or joint abnormality.  Negative for tendon or ligament tear.  Moderate first MTP osteoarthritis. Mild to moderate tarsometatarsal osteoarthritis also seen.  Hammertoe deformities of the second through fifth toes.  ----  Arterial Duplex 09/20/2019 Summary:  Right: Mild calcifications and atherosclerosis without stenosis. Near  normal examination.   Left: Mild calcification and atherosclerosis. No significant  femoral-popliteal disease. 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 Short-segment  occlusion of the anterior tibial artery.   ---  Objective: AAO x3, NAD DP/PT pulses palpable bilaterally, CRT less than 3 seconds Overall exam is unchanged.  There are some chronic purple discoloration to the skin bilaterally with slightly capillary refill time. She is unable to dorsiflex her right first MPJ and the toe sits in a plantarflexed contracted position.  There is no significant pain today however this does cause gait instability. No open lesions or pre-ulcerative lesions.  No pain with calf compression, swelling, warmth, erythema  Assessment: 69 year old female with loss of dorsiflexion right first MPJ; PAD  Plan: -All treatment options discussed with the patient including all alternatives, risks, complications.  -Reviewed the MRI as well as arterial studies with her.  New referral was placed today for vascular  surgery.  I like to hold off any surgical intervention until this is further evaluated.  For now continue supportive shoes and a stiffer soled shoe to help prevent the hallux from plantar flexing.  If circulation felt not to be adequate for surgery discussed possible custom bracing to help with gait instability -Patient encouraged to call the office with any questions, concerns, change in symptoms.   Vivi Barrack DPM

## 2019-10-23 ENCOUNTER — Other Ambulatory Visit: Payer: Self-pay

## 2019-10-23 DIAGNOSIS — G8929 Other chronic pain: Secondary | ICD-10-CM

## 2019-10-23 NOTE — Telephone Encounter (Signed)
Pt called requesting med refill for hydrocodone-ace. Rx pended. Pls send to [facility] pharmacy.   Last visit - 08/17/19 Last written - 09/22/19

## 2019-10-24 MED ORDER — HYDROCODONE-ACETAMINOPHEN 7.5-325 MG PO TABS: 1.0000 | ORAL_TABLET | Freq: Three times a day (TID) | ORAL | 0 refills | Status: DC

## 2019-10-25 NOTE — Telephone Encounter (Signed)
Task completed. Left a detailed vm msg for pt regarding rx refill sent to the pharmacy. Direct call back info provided.

## 2019-11-07 ENCOUNTER — Telehealth: Payer: Self-pay | Admitting: Podiatry

## 2019-11-07 DIAGNOSIS — I739 Peripheral vascular disease, unspecified: Secondary | ICD-10-CM

## 2019-11-07 DIAGNOSIS — R0989 Other specified symptoms and signs involving the circulatory and respiratory systems: Secondary | ICD-10-CM

## 2019-11-07 NOTE — Telephone Encounter (Signed)
Faxed referral to [facility] - Dr. Mauri Pole,

## 2019-11-07 NOTE — Addendum Note (Signed)
Addended by: Alphia Kava D on: 11/07/2019 11:51 AM   Modules accepted: Orders

## 2019-11-07 NOTE — Telephone Encounter (Signed)
Pt called stating she was referred to Vein and Vascular in [facility] but has been unable to get in touch with their office and schedule an appt. Pt is upset with their customer service and would like to have her referral sent to a different vascular office.  Pt mentioned she has previously gone to Dr. Christell Constant with [facility] and would like to know if she can have the referral sent there instead. Please update referral and give patient a call.

## 2019-11-07 NOTE — Telephone Encounter (Signed)
Unable to leave a message pt's mailbox is full. 

## 2019-11-13 ENCOUNTER — Other Ambulatory Visit: Payer: Self-pay

## 2019-11-13 ENCOUNTER — Ambulatory Visit (INDEPENDENT_AMBULATORY_CARE_PROVIDER_SITE_OTHER): Payer: Medicare HMO | Admitting: Family Medicine

## 2019-11-13 ENCOUNTER — Encounter: Payer: Self-pay | Admitting: Family Medicine

## 2019-11-13 VITALS — BP 113/63 | HR 51 | Temp 98.0°F | Ht 63.0 in

## 2019-11-13 DIAGNOSIS — N1832 Chronic kidney disease, stage 3b: Secondary | ICD-10-CM

## 2019-11-13 DIAGNOSIS — J4541 Moderate persistent asthma with (acute) exacerbation: Secondary | ICD-10-CM | POA: Diagnosis not present

## 2019-11-13 DIAGNOSIS — J45991 Cough variant asthma: Secondary | ICD-10-CM

## 2019-11-13 DIAGNOSIS — R05 Cough: Secondary | ICD-10-CM | POA: Diagnosis not present

## 2019-11-13 DIAGNOSIS — G8929 Other chronic pain: Secondary | ICD-10-CM

## 2019-11-13 DIAGNOSIS — F39 Unspecified mood [affective] disorder: Secondary | ICD-10-CM | POA: Diagnosis not present

## 2019-11-13 DIAGNOSIS — R059 Cough, unspecified: Secondary | ICD-10-CM

## 2019-11-13 MED ORDER — HYDROCODONE-ACETAMINOPHEN 7.5-325 MG PO TABS: 1.0000 | ORAL_TABLET | Freq: Three times a day (TID) | ORAL | 0 refills | Status: AC

## 2019-11-13 MED ORDER — PREDNISONE 20 MG PO TABS: 40.0000 mg | ORAL_TABLET | Freq: Every day | ORAL | 0 refills | Status: AC

## 2019-11-13 MED ORDER — ALBUTEROL SULFATE (2.5 MG/3ML) 0.083% IN NEBU: 2.5000 mg | INHALATION_SOLUTION | Freq: Four times a day (QID) | RESPIRATORY_TRACT | 1 refills | Status: DC | PRN

## 2019-11-13 NOTE — Assessment & Plan Note (Signed)
She is actually really doing quite well on her Abilify. I think she would actually do well without the Cymbalta in regards to mood but we are continuing the Cymbalta more for myalgia and chronic pain control.
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
## 2019-11-13 NOTE — Addendum Note (Signed)
Addended by: Deno Etienne on: 11/13/2019 04:10 PM   Modules accepted: Orders

## 2019-11-13 NOTE — Assessment & Plan Note (Addendum)
Stable on current regimen. Refill sent for later this month.  Indication for chronic opioid:DDD Medication and dose:Norco 7.5/325. 1 tab every 8 hours as needed # pills per month: 90 Last UDS date:05/2019 Opioid Treatment Agreement signed (Y/N):05/22/2019 Opioid Treatment Agreement last reviewed with patient:05/2019 NCCSRS reviewed this encounter (include red flags):Yes Additional medications: Cymbalta

## 2019-11-13 NOTE — Assessment & Plan Note (Signed)
Due to recheck renal function. 

## 2019-11-14 LAB — SARS-COV-2, NAA 2 DAY TAT

## 2019-11-14 LAB — SPECIMEN STATUS REPORT

## 2019-11-14 LAB — NOVEL CORONAVIRUS, NAA: SARS-CoV-2, NAA: DETECTED — AB

## 2019-11-15 ENCOUNTER — Telehealth: Payer: Self-pay

## 2019-11-15 ENCOUNTER — Telehealth: Payer: Self-pay | Admitting: Nurse Practitioner

## 2019-11-15 MED ORDER — ALBUTEROL SULFATE HFA 108 (90 BASE) MCG/ACT IN AERS
2.0000 | INHALATION_SPRAY | Freq: Four times a day (QID) | RESPIRATORY_TRACT | 0 refills | Status: AC | PRN
Start: 2019-11-15 — End: ?

## 2019-11-15 NOTE — Telephone Encounter (Signed)
Patient advised.

## 2019-11-15 NOTE — Telephone Encounter (Signed)
Alvino Chapel called and agreed to have the antibody infusion.

## 2019-11-15 NOTE — Telephone Encounter (Signed)
Gave patient number

## 2019-11-15 NOTE — Telephone Encounter (Signed)
Called to Discuss with patient about Covid symptoms and the use of bamlanivimab, a monoclonal antibody infusion for those with mild to moderate Covid symptoms and at a high risk of hospitalization.     Pt needs to be qualified for this infusion at the [facility] center due to co-morbid conditions and/or a member of an at-risk group.     Unable to reach pt  

## 2019-11-15 NOTE — Telephone Encounter (Signed)
Please tell her I apologize I just an over new prescription for a regular MDI inhaler. Though we can always work on getting her a nebulizer machine at some point if she would like and prefer.

## 2019-11-15 NOTE — Telephone Encounter (Signed)
Patient called stating she thought she was getting an inhaler but she received solution for a nebulizer. She does not have nebulizer. Please advise

## 2019-11-15 NOTE — Telephone Encounter (Signed)
I have been trying to reach this patient to see if she qualifies for MAB. I have not been able to reach her. Could you have her call me back at [phone]

## 2019-11-16 ENCOUNTER — Emergency Department (HOSPITAL_COMMUNITY): Payer: Medicare HMO

## 2019-11-16 ENCOUNTER — Other Ambulatory Visit: Payer: Self-pay

## 2019-11-16 ENCOUNTER — Ambulatory Visit (HOSPITAL_COMMUNITY)
Admission: RE | Admit: 2019-11-16 | Discharge: 2019-11-16 | Disposition: A | Discharge status: Home / Self Care | Payer: Medicare HMO | Source: Ambulatory Visit | Attending: Pulmonary Disease | Admitting: Pulmonary Disease

## 2019-11-16 ENCOUNTER — Other Ambulatory Visit: Payer: Self-pay | Admitting: Nurse Practitioner

## 2019-11-16 ENCOUNTER — Inpatient Hospital Stay (HOSPITAL_COMMUNITY)
Admission: EM | Admit: 2019-11-16 | Discharge: 2019-11-29 | DRG: 177 | Disposition: E | Payer: Medicare HMO | Source: Ambulatory Visit | Attending: Internal Medicine | Admitting: Internal Medicine

## 2019-11-16 DIAGNOSIS — E785 Hyperlipidemia, unspecified: Secondary | ICD-10-CM | POA: Diagnosis not present

## 2019-11-16 DIAGNOSIS — R778 Other specified abnormalities of plasma proteins: Secondary | ICD-10-CM | POA: Diagnosis not present

## 2019-11-16 DIAGNOSIS — Z91041 Radiographic dye allergy status: Secondary | ICD-10-CM

## 2019-11-16 DIAGNOSIS — Z79899 Other long term (current) drug therapy: Secondary | ICD-10-CM

## 2019-11-16 DIAGNOSIS — J189 Pneumonia, unspecified organism: Secondary | ICD-10-CM | POA: Diagnosis not present

## 2019-11-16 DIAGNOSIS — Z90710 Acquired absence of both cervix and uterus: Secondary | ICD-10-CM | POA: Diagnosis not present

## 2019-11-16 DIAGNOSIS — Z515 Encounter for palliative care: Secondary | ICD-10-CM | POA: Diagnosis not present

## 2019-11-16 DIAGNOSIS — J1289 Other viral pneumonia: Secondary | ICD-10-CM | POA: Diagnosis not present

## 2019-11-16 DIAGNOSIS — J9601 Acute respiratory failure with hypoxia: Secondary | ICD-10-CM | POA: Diagnosis not present

## 2019-11-16 DIAGNOSIS — Z9049 Acquired absence of other specified parts of digestive tract: Secondary | ICD-10-CM

## 2019-11-16 DIAGNOSIS — Z8249 Family history of ischemic heart disease and other diseases of the circulatory system: Secondary | ICD-10-CM | POA: Diagnosis not present

## 2019-11-16 DIAGNOSIS — U071 COVID-19: Secondary | ICD-10-CM | POA: Diagnosis not present

## 2019-11-16 DIAGNOSIS — K219 Gastro-esophageal reflux disease without esophagitis: Secondary | ICD-10-CM | POA: Diagnosis not present

## 2019-11-16 DIAGNOSIS — N179 Acute kidney failure, unspecified: Secondary | ICD-10-CM | POA: Diagnosis not present

## 2019-11-16 DIAGNOSIS — Z880 Allergy status to penicillin: Secondary | ICD-10-CM

## 2019-11-16 DIAGNOSIS — J1282 Pneumonia due to coronavirus disease 2019: Secondary | ICD-10-CM

## 2019-11-16 DIAGNOSIS — I639 Cerebral infarction, unspecified: Secondary | ICD-10-CM | POA: Diagnosis not present

## 2019-11-16 DIAGNOSIS — N1832 Chronic kidney disease, stage 3b: Secondary | ICD-10-CM | POA: Diagnosis present

## 2019-11-16 DIAGNOSIS — Z825 Family history of asthma and other chronic lower respiratory diseases: Secondary | ICD-10-CM

## 2019-11-16 DIAGNOSIS — Z832 Family history of diseases of the blood and blood-forming organs and certain disorders involving the immune mechanism: Secondary | ICD-10-CM

## 2019-11-16 DIAGNOSIS — I088 Other rheumatic multiple valve diseases: Secondary | ICD-10-CM | POA: Diagnosis present

## 2019-11-16 DIAGNOSIS — I10 Essential (primary) hypertension: Secondary | ICD-10-CM | POA: Diagnosis not present

## 2019-11-16 DIAGNOSIS — R918 Other nonspecific abnormal finding of lung field: Secondary | ICD-10-CM | POA: Diagnosis not present

## 2019-11-16 DIAGNOSIS — R29723 NIHSS score 23: Secondary | ICD-10-CM | POA: Diagnosis not present

## 2019-11-16 DIAGNOSIS — E872 Acidosis, unspecified: Secondary | ICD-10-CM | POA: Diagnosis present

## 2019-11-16 DIAGNOSIS — R197 Diarrhea, unspecified: Secondary | ICD-10-CM | POA: Diagnosis present

## 2019-11-16 DIAGNOSIS — I129 Hypertensive chronic kidney disease with stage 1 through stage 4 chronic kidney disease, or unspecified chronic kidney disease: Secondary | ICD-10-CM | POA: Diagnosis present

## 2019-11-16 DIAGNOSIS — I63411 Cerebral infarction due to embolism of right middle cerebral artery: Secondary | ICD-10-CM | POA: Diagnosis not present

## 2019-11-16 DIAGNOSIS — R0902 Hypoxemia: Secondary | ICD-10-CM

## 2019-11-16 DIAGNOSIS — J45991 Cough variant asthma: Secondary | ICD-10-CM | POA: Diagnosis present

## 2019-11-16 DIAGNOSIS — Z66 Do not resuscitate: Secondary | ICD-10-CM | POA: Diagnosis not present

## 2019-11-16 DIAGNOSIS — I739 Peripheral vascular disease, unspecified: Secondary | ICD-10-CM | POA: Diagnosis present

## 2019-11-16 DIAGNOSIS — G9341 Metabolic encephalopathy: Secondary | ICD-10-CM | POA: Diagnosis not present

## 2019-11-16 DIAGNOSIS — J90 Pleural effusion, not elsewhere classified: Secondary | ICD-10-CM | POA: Diagnosis not present

## 2019-11-16 DIAGNOSIS — F329 Major depressive disorder, single episode, unspecified: Secondary | ICD-10-CM | POA: Diagnosis present

## 2019-11-16 DIAGNOSIS — I340 Nonrheumatic mitral (valve) insufficiency: Secondary | ICD-10-CM | POA: Diagnosis not present

## 2019-11-16 DIAGNOSIS — Z888 Allergy status to other drugs, medicaments and biological substances status: Secondary | ICD-10-CM

## 2019-11-16 DIAGNOSIS — G8929 Other chronic pain: Secondary | ICD-10-CM | POA: Diagnosis present

## 2019-11-16 DIAGNOSIS — I371 Nonrheumatic pulmonary valve insufficiency: Secondary | ICD-10-CM | POA: Diagnosis not present

## 2019-11-16 DIAGNOSIS — Z809 Family history of malignant neoplasm, unspecified: Secondary | ICD-10-CM | POA: Diagnosis not present

## 2019-11-16 DIAGNOSIS — N289 Disorder of kidney and ureter, unspecified: Secondary | ICD-10-CM | POA: Diagnosis not present

## 2019-11-16 DIAGNOSIS — G8194 Hemiplegia, unspecified affecting left nondominant side: Secondary | ICD-10-CM | POA: Diagnosis not present

## 2019-11-16 DIAGNOSIS — Y92239 Unspecified place in hospital as the place of occurrence of the external cause: Secondary | ICD-10-CM | POA: Diagnosis not present

## 2019-11-16 DIAGNOSIS — I63511 Cerebral infarction due to unspecified occlusion or stenosis of right middle cerebral artery: Secondary | ICD-10-CM | POA: Diagnosis not present

## 2019-11-16 DIAGNOSIS — I248 Other forms of acute ischemic heart disease: Secondary | ICD-10-CM | POA: Diagnosis present

## 2019-11-16 DIAGNOSIS — E669 Obesity, unspecified: Secondary | ICD-10-CM | POA: Diagnosis not present

## 2019-11-16 DIAGNOSIS — Z6833 Body mass index (BMI) 33.0-33.9, adult: Secondary | ICD-10-CM

## 2019-11-16 DIAGNOSIS — T380X5A Adverse effect of glucocorticoids and synthetic analogues, initial encounter: Secondary | ICD-10-CM | POA: Diagnosis not present

## 2019-11-16 DIAGNOSIS — R9431 Abnormal electrocardiogram [ECG] [EKG]: Secondary | ICD-10-CM | POA: Diagnosis present

## 2019-11-16 DIAGNOSIS — Z881 Allergy status to other antibiotic agents status: Secondary | ICD-10-CM

## 2019-11-16 DIAGNOSIS — Z882 Allergy status to sulfonamides status: Secondary | ICD-10-CM

## 2019-11-16 DIAGNOSIS — I959 Hypotension, unspecified: Secondary | ICD-10-CM | POA: Diagnosis present

## 2019-11-16 DIAGNOSIS — E861 Hypovolemia: Secondary | ICD-10-CM | POA: Diagnosis present

## 2019-11-16 DIAGNOSIS — Z7952 Long term (current) use of systemic steroids: Secondary | ICD-10-CM

## 2019-11-16 LAB — COMPREHENSIVE METABOLIC PANEL
ALT: 25 U/L (ref 0–44)
AST: 67 U/L — ABNORMAL HIGH (ref 15–41)
Albumin: 3.1 g/dL — ABNORMAL LOW (ref 3.5–5.0)
Alkaline Phosphatase: 94 U/L (ref 38–126)
Anion gap: 20 — ABNORMAL HIGH (ref 5–15)
BUN: 57 mg/dL — ABNORMAL HIGH (ref 8–23)
CO2: 11 mmol/L — ABNORMAL LOW (ref 22–32)
Calcium: 8.5 mg/dL — ABNORMAL LOW (ref 8.9–10.3)
Chloride: 106 mmol/L (ref 98–111)
Creatinine, Ser: 4.29 mg/dL — ABNORMAL HIGH (ref 0.44–1.00)
GFR calc Af Amer: 11 mL/min — ABNORMAL LOW (ref >60)
GFR calc non Af Amer: 10 mL/min — ABNORMAL LOW (ref >60)
Glucose, Bld: 131 mg/dL — ABNORMAL HIGH (ref 70–99)
Potassium: 5 mmol/L (ref 3.5–5.1)
Sodium: 137 mmol/L (ref 135–145)
Total Bilirubin: 0.8 mg/dL (ref 0.3–1.2)
Total Protein: 8.2 g/dL — ABNORMAL HIGH (ref 6.5–8.1)

## 2019-11-16 LAB — CBC WITH DIFFERENTIAL/PLATELET
Abs Immature Granulocytes: 0.15 10*3/uL — ABNORMAL HIGH (ref 0.00–0.07)
Basophils Absolute: 0 10*3/uL (ref 0.0–0.1)
Basophils Relative: 0 %
Eosinophils Absolute: 0 10*3/uL (ref 0.0–0.5)
Eosinophils Relative: 0 %
HCT: 46.3 % — ABNORMAL HIGH (ref 36.0–46.0)
Hemoglobin: 14.2 g/dL (ref 12.0–15.0)
Immature Granulocytes: 1 %
Lymphocytes Relative: 7 %
Lymphs Abs: 1.2 10*3/uL (ref 0.7–4.0)
MCH: 30.7 pg (ref 26.0–34.0)
MCHC: 30.7 g/dL (ref 30.0–36.0)
MCV: 100 fL (ref 80.0–100.0)
Monocytes Absolute: 0.7 10*3/uL (ref 0.1–1.0)
Monocytes Relative: 4 %
Neutro Abs: 16 10*3/uL — ABNORMAL HIGH (ref 1.7–7.7)
Neutrophils Relative %: 88 %
Platelets: 312 10*3/uL (ref 150–400)
RBC: 4.63 MIL/uL (ref 3.87–5.11)
RDW: 14.1 % (ref 11.5–15.5)
WBC: 18.1 10*3/uL — ABNORMAL HIGH (ref 4.0–10.5)
nRBC: 0 % (ref 0.0–0.2)

## 2019-11-16 LAB — LACTATE DEHYDROGENASE: LDH: 457 U/L — ABNORMAL HIGH (ref 98–192)

## 2019-11-16 LAB — FERRITIN: Ferritin: 577 ng/mL — ABNORMAL HIGH (ref 11–307)

## 2019-11-16 LAB — LACTIC ACID, PLASMA: Lactic Acid, Venous: 3.1 mmol/L (ref 0.5–1.9)

## 2019-11-16 LAB — TRIGLYCERIDES: Triglycerides: 193 mg/dL — ABNORMAL HIGH (ref <150)

## 2019-11-16 LAB — TROPONIN I (HIGH SENSITIVITY): Troponin I (High Sensitivity): 40 ng/L — ABNORMAL HIGH (ref <18)

## 2019-11-16 LAB — PROCALCITONIN: Procalcitonin: 0.47 ng/mL

## 2019-11-16 LAB — BRAIN NATRIURETIC PEPTIDE: B Natriuretic Peptide: 193.6 pg/mL — ABNORMAL HIGH (ref 0.0–100.0)

## 2019-11-16 LAB — D-DIMER, QUANTITATIVE: D-Dimer, Quant: 1.76 ug/mL-FEU — ABNORMAL HIGH (ref 0.00–0.50)

## 2019-11-16 LAB — C-REACTIVE PROTEIN: CRP: 11.8 mg/dL — ABNORMAL HIGH (ref <1.0)

## 2019-11-16 LAB — FIBRINOGEN: Fibrinogen: 131 mg/dL — ABNORMAL LOW (ref 210–475)

## 2019-11-16 MED ORDER — ATORVASTATIN CALCIUM 10 MG PO TABS
10.0000 mg | ORAL_TABLET | Freq: Every day | ORAL | Status: DC
  Administered 2019-11-17 – 2019-11-19 (×4): 10 mg via ORAL
  Filled 2019-11-16 (×5): qty 1

## 2019-11-16 MED ORDER — ONDANSETRON HCL 4 MG PO TABS: 4.0000 mg | ORAL_TABLET | Freq: Four times a day (QID) | ORAL | Status: DC | PRN

## 2019-11-16 MED ORDER — SODIUM CHLORIDE 0.9 % IV SOLN
200.0000 mg | Freq: Once | INTRAVENOUS | Status: AC
  Administered 2019-11-16: 200 mg via INTRAVENOUS
  Filled 2019-11-16: qty 200

## 2019-11-16 MED ORDER — EPINEPHRINE 0.3 MG/0.3ML IJ SOAJ: 0.3000 mg | Freq: Once | INTRAMUSCULAR | Status: DC | PRN

## 2019-11-16 MED ORDER — CHLORHEXIDINE GLUCONATE 0.12% ORAL RINSE (MEDLINE KIT)
15.0000 mL | Freq: Two times a day (BID) | OROMUCOSAL | Status: DC
  Administered 2019-11-17 – 2019-11-18 (×2): 15 mL via OROMUCOSAL

## 2019-11-16 MED ORDER — ALBUTEROL SULFATE HFA 108 (90 BASE) MCG/ACT IN AERS
2.0000 | INHALATION_SPRAY | Freq: Four times a day (QID) | RESPIRATORY_TRACT | Status: DC | PRN
  Administered 2019-11-17: 2 via RESPIRATORY_TRACT
  Filled 2019-11-16: qty 6.7

## 2019-11-16 MED ORDER — ENOXAPARIN SODIUM 30 MG/0.3ML ~~LOC~~ SOLN
30.0000 mg | SUBCUTANEOUS | Status: DC
  Administered 2019-11-17 – 2019-11-19 (×4): 30 mg via SUBCUTANEOUS
  Filled 2019-11-16 (×4): qty 0.3

## 2019-11-16 MED ORDER — ORAL CARE MOUTH RINSE
15.0000 mL | OROMUCOSAL | Status: DC
  Administered 2019-11-17 – 2019-11-19 (×10): 15 mL via OROMUCOSAL

## 2019-11-16 MED ORDER — SODIUM CHLORIDE 0.9 % IV SOLN
100.0000 mg | Freq: Every day | INTRAVENOUS | Status: AC
  Administered 2019-11-17 – 2019-11-20 (×4): 100 mg via INTRAVENOUS
  Filled 2019-11-16 (×4): qty 20

## 2019-11-16 MED ORDER — LACTATED RINGERS IV BOLUS
500.0000 mL | Freq: Once | INTRAVENOUS | Status: AC
  Administered 2019-11-16: 500 mL via INTRAVENOUS

## 2019-11-16 MED ORDER — SODIUM CHLORIDE 0.9% FLUSH
3.0000 mL | Freq: Two times a day (BID) | INTRAVENOUS | Status: DC
  Administered 2019-11-16 – 2019-11-21 (×8): 3 mL via INTRAVENOUS

## 2019-11-16 MED ORDER — METHYLPREDNISOLONE SODIUM SUCC 125 MG IJ SOLR: 125.0000 mg | Freq: Once | INTRAMUSCULAR | Status: DC | PRN

## 2019-11-16 MED ORDER — FAMOTIDINE IN NACL 20-0.9 MG/50ML-% IV SOLN: 20.0000 mg | Freq: Once | INTRAVENOUS | Status: DC | PRN

## 2019-11-16 MED ORDER — GUAIFENESIN-DM 100-10 MG/5ML PO SYRP
10.0000 mL | ORAL_SOLUTION | ORAL | Status: DC | PRN
  Filled 2019-11-16: qty 10

## 2019-11-16 MED ORDER — TOPIRAMATE 25 MG PO TABS
25.0000 mg | ORAL_TABLET | Freq: Two times a day (BID) | ORAL | Status: DC
  Administered 2019-11-17 – 2019-11-20 (×8): 25 mg via ORAL
  Filled 2019-11-16 (×8): qty 1

## 2019-11-16 MED ORDER — ARIPIPRAZOLE 10 MG PO TABS
10.0000 mg | ORAL_TABLET | Freq: Every day | ORAL | Status: DC
  Administered 2019-11-17 – 2019-11-20 (×4): 10 mg via ORAL
  Filled 2019-11-16 (×5): qty 1

## 2019-11-16 MED ORDER — ALBUTEROL SULFATE HFA 108 (90 BASE) MCG/ACT IN AERS: 2.0000 | INHALATION_SPRAY | Freq: Once | RESPIRATORY_TRACT | Status: DC | PRN

## 2019-11-16 MED ORDER — LACTATED RINGERS IV SOLN: INTRAVENOUS | Status: DC

## 2019-11-16 MED ORDER — ASCORBIC ACID 500 MG PO TABS
500.0000 mg | ORAL_TABLET | Freq: Every day | ORAL | Status: DC
  Administered 2019-11-17 – 2019-11-20 (×4): 500 mg via ORAL
  Filled 2019-11-16 (×4): qty 1

## 2019-11-16 MED ORDER — SODIUM CHLORIDE 0.9 % IV SOLN: INTRAVENOUS | Status: DC

## 2019-11-16 MED ORDER — SODIUM CHLORIDE 0.9 % IV SOLN: INTRAVENOUS | Status: DC | PRN

## 2019-11-16 MED ORDER — SODIUM CHLORIDE 0.9 % IV SOLN: 200.0000 mg | Freq: Once | INTRAVENOUS | Status: DC

## 2019-11-16 MED ORDER — ACETAMINOPHEN 325 MG PO TABS
650.0000 mg | ORAL_TABLET | Freq: Four times a day (QID) | ORAL | Status: DC | PRN
  Administered 2019-11-19 (×2): 650 mg via ORAL
  Filled 2019-11-16 (×2): qty 2

## 2019-11-16 MED ORDER — ONDANSETRON HCL 4 MG/2ML IJ SOLN: 4.0000 mg | Freq: Four times a day (QID) | INTRAMUSCULAR | Status: DC | PRN

## 2019-11-16 MED ORDER — SODIUM CHLORIDE 0.9 % IV SOLN: 100.0000 mg | Freq: Every day | INTRAVENOUS | Status: DC

## 2019-11-16 MED ORDER — HYDROCODONE-ACETAMINOPHEN 7.5-325 MG PO TABS: 1.0000 | ORAL_TABLET | Freq: Three times a day (TID) | ORAL | Status: DC

## 2019-11-16 MED ORDER — DEXAMETHASONE SODIUM PHOSPHATE 10 MG/ML IJ SOLN
6.0000 mg | Freq: Once | INTRAMUSCULAR | Status: AC
  Administered 2019-11-16: 6 mg via INTRAVENOUS
  Filled 2019-11-16: qty 1

## 2019-11-16 MED ORDER — ZINC SULFATE 220 (50 ZN) MG PO CAPS
220.0000 mg | ORAL_CAPSULE | Freq: Every day | ORAL | Status: DC
  Administered 2019-11-17 – 2019-11-20 (×4): 220 mg via ORAL
  Filled 2019-11-16 (×4): qty 1

## 2019-11-16 MED ORDER — DULOXETINE HCL 30 MG PO CPEP
60.0000 mg | ORAL_CAPSULE | Freq: Every day | ORAL | Status: DC
  Administered 2019-11-17 – 2019-11-20 (×4): 60 mg via ORAL
  Filled 2019-11-16 (×4): qty 2

## 2019-11-16 MED ORDER — TOCILIZUMAB 400 MG/20ML IV SOLN
670.0000 mg | Freq: Once | INTRAVENOUS | Status: AC
  Administered 2019-11-16: 670 mg via INTRAVENOUS
  Filled 2019-11-16: qty 33.5

## 2019-11-16 MED ORDER — METHYLPREDNISOLONE SODIUM SUCC 40 MG IJ SOLR
0.2500 mg/kg | Freq: Two times a day (BID) | INTRAMUSCULAR | Status: DC
  Administered 2019-11-17: 21.2 mg via INTRAVENOUS
  Filled 2019-11-16: qty 1

## 2019-11-16 MED ORDER — DIPHENHYDRAMINE HCL 50 MG/ML IJ SOLN: 50.0000 mg | Freq: Once | INTRAMUSCULAR | Status: DC | PRN

## 2019-11-16 MED ORDER — SODIUM CHLORIDE 0.9 % IV SOLN: 1200.0000 mg | Freq: Once | INTRAVENOUS | Status: DC

## 2019-11-16 NOTE — Progress Notes (Signed)
I connected by phone with Tracy Massey on 10/30/2019 at 1:13 PM to discuss the potential use of a new treatment for mild to moderate COVID-19 viral infection in non-hospitalized patients.  This patient is a 69 y.o. female that meets the FDA criteria for Emergency Use Authorization of COVID monoclonal antibody casirivimab/imdevimab.  Has a (+) direct SARS-CoV-2 viral test result  Has mild or moderate COVID-19   Is NOT hospitalized due to COVID-19  Is within 10 days of symptom onset  Has at least one of the high risk factor(s) for progression to severe COVID-19 and/or hospitalization as defined in EUA.  Specific high risk criteria : Older age (>/= 71 yo)   I have spoken and communicated the following to the patient or parent/caregiver regarding COVID monoclonal antibody treatment:  1. FDA has authorized the emergency use for the treatment of mild to moderate COVID-19 in adults and pediatric patients with positive results of direct SARS-CoV-2 viral testing who are 54 years of age and older weighing at least 40 kg, and who are at high risk for progressing to severe COVID-19 and/or hospitalization.  2. The significant known and potential risks and benefits of COVID monoclonal antibody, and the extent to which such potential risks and benefits are unknown.  3. Information on available alternative treatments and the risks and benefits of those alternatives, including clinical trials.  4. Patients treated with COVID monoclonal antibody should continue to self-isolate and use infection control measures (e.g., wear mask, isolate, social distance, avoid sharing personal items, clean and disinfect "high touch" surfaces, and frequent handwashing) according to CDC guidelines.   5. The patient or parent/caregiver has the option to accept or refuse COVID monoclonal antibody treatment.  After reviewing this information with the patient, The patient agreed to proceed with receiving  casirivimab\imdevimab infusion and will be provided a copy of the Fact sheet prior to receiving the infusion. [name] 11/13/2019 1:13 PM

## 2019-11-16 NOTE — H&P (Signed)
Tracy Massey ZOX:[id number] DOB: 05-13-1950 DOA: 11/02/2019     PCP: Hali Marry, MD   Outpatient Specialists:     Vascular surgeon  Patient arrived to ER on 11/21/2019 at [place] Referred by Attending Lucrezia Starch, MD  Patient coming from: home Lives with roomate   Chief Complaint:  Chief Complaint  Patient presents with  . Shortness of Breath    HPI: Tracy Massey is a 69 y.o. female with medical history significant of asthma, HLD, HTN, PVD    Presented with confusion CVA hypoxia secondary to Covid infection She started have symptoms 6 days Recently diagnosed with COVID was brought and for monoclonal antibody infusion Apparently on the way to the center she became confused and lost in the parking lot. When patient was  found she was cyanotic noted to be hypotensive 82/31 setting 63% room air started on nonrebreather improved O2 into 80s in ED oxygen turned up to 6 L if oxygen saturation going up to 90s. Transfer to emergency department required up to 15 L Patient initially was seen by her primary care provider and felt that she has asthma she was started on prednisone and albuterol Who Covid test came back positive on 16 August  She thinks she got sick from her roommate  Infectious risk factors:  Reports shortness of breath, fever  dry cough ore throat, URI symptoms,   N/ Diarrhea, back pain Body aches, severe fatigue Reports known sick contacts   [place] been vaccinated against COVID    Initial COVID TEST   POSITIVE,     Lab Results  Component Value Date   SARSCOV2NAA Detected (A) 11/13/2019     Regarding pertinent Chronic problems:    Hyperlipidemia -  on statins Lipitor Lipid Panel     Component Value Date/Time   CHOL 174 07/13/2018 1002   TRIG 193 (H) 11/21/2019 1944   HDL 55 07/13/2018 1002   CHOLHDL 3.2 07/13/2018 1002   VLDL 28 06/01/2011 0800   LDLCALC 98 07/13/2018 1002   Depression on Cymbalta,  Abilify   HTN on lisinopril    obesity-   BMI Readings from Last 1 Encounters:  11/25/2019 32.80 kg/m       Asthma -well  controlled on home inhalers    CKD stage III - baseline Cr 1.7 Estimated Creatinine Clearance: 12.5 mL/min (A) (by C-G formula based on SCr of 4.29 mg/dL (H)).  Lab Results  Component Value Date   CREATININE 4.29 (H) 11/10/2019   CREATININE 1.68 (H) 06/20/2019   CREATININE 1.29 (H) 01/17/2019     While in ER: Bilateral infiltrates consistent with Covid pneumonia   Hospitalist was called for admission for COVID pneumonia resulting in respiratory failure and AKI  The following Work up has been ordered so far:  Orders Placed This Encounter  Procedures  . Blood Culture (routine x 2)  . DG Chest Port 1 View  . Lactic acid, plasma  . CBC WITH DIFFERENTIAL  . Comprehensive metabolic panel  . Lactate dehydrogenase  . Ferritin  . Triglycerides  . C-reactive protein  . Brain natriuretic peptide  . D-dimer, quantitative (not at [facility])  . Fibrinogen  . Procalcitonin  . Urinalysis, Routine w reflex microscopic  . Cardiac monitoring  . Initiate Carrier Fluid Protocol  . Place surgical mask on patient  . Patient to wear surgical mask during transportation  . Assess patient for ability to self-prone. If able (can move self  in bed, ambulate) and stable (SpO2 and oxygen requirement):  . RN/NT - Document specific oxygen requirements in CHL  . RN to draw the following extra tubes:  . Consult to hospitalist  ALL PATIENTS BEING ADMITTED/HAVING PROCEDURES NEED COVID-19 SCREENING  . Airborne and Contact precautions  . Pulse oximetry, continuous  . Oxygen therapy Mode or (Route): High flow nasal cannula  . ED EKG 12-Lead   Following Medications were ordered in ER: Medications  dexamethasone (DECADRON) injection 6 mg (has no administration in time range)  lactated ringers infusion (has no administration in time range)        Consult Orders  (From admission,  onward)         Start     Ordered   11/04/2019 2032  Consult to hospitalist  ALL PATIENTS BEING ADMITTED/HAVING PROCEDURES NEED COVID-19 SCREENING  Once       Comments: ALL PATIENTS BEING ADMITTED/HAVING PROCEDURES NEED COVID-19 SCREENING  Provider:  (Not yet assigned)  Question Answer Comment  Place call to: [facility]   Reason for Consult Admit      11/26/2019 2032          Significant initial  Findings: Abnormal Labs Reviewed  LACTIC ACID, PLASMA - Abnormal; Notable for the following components:      Result Value   Lactic Acid, Venous 3.1 (*)    All other components within normal limits  CBC WITH DIFFERENTIAL/PLATELET - Abnormal; Notable for the following components:   WBC 18.1 (*)    HCT 46.3 (*)    Neutro Abs 16.0 (*)    Abs Immature Granulocytes 0.15 (*)    All other components within normal limits  COMPREHENSIVE METABOLIC PANEL - Abnormal; Notable for the following components:   CO2 11 (*)    Glucose, Bld 131 (*)    BUN 57 (*)    Creatinine, Ser 4.29 (*)    Calcium 8.5 (*)    Total Protein 8.2 (*)    Albumin 3.1 (*)    AST 67 (*)    GFR calc non Af Amer 10 (*)    GFR calc Af Amer 11 (*)    Anion gap 20 (*)    All other components within normal limits  LACTATE DEHYDROGENASE - Abnormal; Notable for the following components:   LDH 457 (*)    All other components within normal limits  FERRITIN - Abnormal; Notable for the following components:   Ferritin 577 (*)    All other components within normal limits  TRIGLYCERIDES - Abnormal; Notable for the following components:   Triglycerides 193 (*)    All other components within normal limits  C-REACTIVE PROTEIN - Abnormal; Notable for the following components:   CRP 11.8 (*)    All other components within normal limits  BRAIN NATRIURETIC PEPTIDE - Abnormal; Notable for the following components:   B Natriuretic Peptide 193.6 (*)    All other components within normal limits  D-DIMER, QUANTITATIVE (NOT AT [facility])  - Abnormal; Notable for the following components:   D-Dimer, Quant 1.76 (*)    All other components within normal limits  FIBRINOGEN - Abnormal; Notable for the following components:   Fibrinogen 131 (*)    All other components within normal limits  TROPONIN I (HIGH SENSITIVITY) - Abnormal; Notable for the following components:   Troponin I (High Sensitivity) 40 (*)    All other components within normal limits    Otherwise labs showing:    Recent Labs  Lab 11/02/2019 1915  NA 137  K 5.0  CO2 11*  GLUCOSE 131*  BUN 57*  CREATININE 4.29*  CALCIUM 8.5*    Cr   Up from baseline see below Lab Results  Component Value Date   CREATININE 4.29 (H) 11/13/2019   CREATININE 1.68 (H) 06/20/2019   CREATININE 1.29 (H) 01/17/2019    Recent Labs  Lab 11/03/2019 1915  AST 67*  ALT 25  ALKPHOS 94  BILITOT 0.8  PROT 8.2*  ALBUMIN 3.1*   Lab Results  Component Value Date   CALCIUM 8.5 (L) 11/10/2019   PHOS 4.4 (H) 10/13/2018     WBC      Component Value Date/Time   WBC 18.1 (H) 11/02/2019 1915   ANC    Component Value Date/Time   NEUTROABS 16.0 (H) 11/04/2019 1915      Plt: Lab Results  Component Value Date   PLT 312 11/03/2019    Lactic Acid, Venous    Component Value Date/Time   LATICACIDVEN 3.1 (HH) 11/07/2019 2025     Procalcitonin 0.47   COVID-19 Labs  Recent Labs    11/19/2019 1915 11/10/2019 1944  DDIMER  --  1.76*  LDH 457*  --     Lab Results  Component Value Date   SARSCOV2NAA Detected (A) 11/13/2019       HG/HCT  stable,       Component Value Date/Time   HGB 14.2 10/31/2019 1915   HCT 46.3 (H) 10/29/2019 1915      Troponin 40      ECG: Ordered Personally reviewed by me showing: HR : 72 Rhythm:  NSR,    nonspecific changes  QTC 601   BNP (last 3 results) Recent Labs    11/13/2019 1915  BNP 193.6*    DM  labs:  HbA1C: Recent Labs    01/17/19 1104 05/22/19 0759  HGBA1C 5.3 5.4     CBG (last 3)  No results for  input(s): GLUCAP in the last 72 hours.     UA  not ordered      Ordered  CXR - bilateral PNA    ED Triage Vitals  Enc Vitals Group     BP 11/25/2019 1845 122/79     Pulse Rate 11/15/2019 1846 74     Resp 11/27/2019 1845 (!) 23     Temp --      Temp src --      SpO2 10/29/2019 1846 90 %     Weight 11/17/2019 1856 185 lb 3 oz (84 kg)     Height 10/31/2019 1856 5' 3"  (1.6 m)     Head Circumference --      Peak Flow --      Pain Score 11/20/2019 1856 0     Pain Loc --      Pain Edu? --      Excl. in [place]? --   TMAX(24)@       Latest  Blood pressure 116/65, pulse 60, resp. rate (!) 26, height 5' 3"  (1.6 m), weight 84 kg, SpO2 (!) 88 %.       Review of Systems:    Pertinent positives include:  Fevers, chills, fatigue, shortness of breath at rest.   dyspnea on exertion,  Constitutional:  No weight loss, night sweats, weight loss  HEENT:  No headaches, Difficulty swallowing,Tooth/dental problems,Sore throat,  No sneezing, itching, ear ache, nasal congestion, post nasal drip,  Cardio-vascular:  No chest pain, Orthopnea, PND, anasarca, dizziness, palpitations.no Bilateral lower extremity swelling  GI:  No heartburn, indigestion, abdominal pain, nausea, vomiting, diarrhea, change in bowel habits, loss of appetite, melena, blood in stool, hematemesis Resp:  no No excess mucus, no productive cough, No non-productive cough, No coughing up of blood.No change in color of mucus.No wheezing. Skin:  no rash or lesions. No jaundice GU:  no dysuria, change in color of urine, no urgency or frequency. No straining to urinate.  No flank pain.  Musculoskeletal:  No joint pain or no joint swelling. No decreased range of motion. No back pain.  Psych:  No change in mood or affect. No depression or anxiety. No memory loss.  Neuro: no localizing neurological complaints, no tingling, no weakness, no double vision, no gait abnormality, no slurred speech, no confusion  All systems reviewed and apart from  [place] all are negative  Past Medical History:   Past Medical History:  Diagnosis Date  . Asthma   . Diverticulosis   . Hyperlipidemia   . Hypertension   . Menopausal syndrome   . Neck pain, chronic       Past Surgical History:  Procedure Laterality Date  . ABDOMINAL HYSTERECTOMY  1986   TAH w/o BSO/endometriosis  . APPENDECTOMY  1983  . CHOLECYSTECTOMY  1995   lap  . HAND SURGERY    . [place]   LT foot  . KNEE ARTHROSCOPY  1983   RT knee and LT knee 1986, 1990  . TUBAL LIGATION     bilateral    Social History:  Ambulatory   Independently      reports that she has never smoked. She has never used smokeless tobacco. She reports current alcohol use. She reports that she does not use drugs.     Family History:   Family History  Problem Relation Age of Onset  . Hypertension Mother   . Clotting disorder Mother   . Cancer Father 63       throat/died  . Hypertension Daughter   . Hypertension Brother   . Emphysema Paternal Grandfather     Allergies: Allergies  Allergen Reactions  . Other Anaphylaxis    [place]  . Keflex [Cephalexin] Swelling    Hand swelling/redness  . Iodinated Diagnostic Agents     Pt reports that she recently had an MRI and they used dye she didn't have a reaction  . Metformin And Related Nausea And Vomiting  . Latex Rash  . Penicillins Rash  . Sulfa Antibiotics Nausea And Vomiting     Prior to Admission medications   Medication Sig Start Date End Date Taking? Authorizing Provider  albuterol (VENTOLIN HFA) 108 (90 Base) MCG/ACT inhaler Inhale 2 puffs into the lungs every 6 (six) hours as needed for wheezing or shortness of breath. 11/15/19   Hali Marry, MD  ARIPiprazole (ABILIFY) 10 MG tablet Take 1 tablet (10 mg total) by mouth daily. 10/12/19   Hali Marry, MD  atorvastatin (LIPITOR) 10 MG tablet TAKE 1 TABLET AT BEDTIME 07/20/19   Hali Marry, MD  DULoxetine (CYMBALTA) 60 MG capsule  Take 1 capsule (60 mg total) by mouth daily. 08/17/19   Hali Marry, MD  HYDROcodone-acetaminophen ([place]) 7.5-325 MG tablet Take 1 tablet by mouth every 8 (eight) hours. 12/15/19   Hali Marry, MD  lisinopril (ZESTRIL) 40 MG tablet Take 1 tablet (40 mg total) by mouth daily. 07/28/19   Hali Marry, MD  metoprolol succinate (TOPROL-XL) 25 MG 24 hr tablet TAKE 1 TABLET EVERY  DAY 07/20/19   Hali Marry, MD  Phentermine HCl (LOMAIRA) 8 MG TABS 1 tab po 30 min before breakfast and 1 tab po at 2 pm daily 08/22/19   [provider]  predniSONE (DELTASONE) 20 MG tablet Take 2 tablets (40 mg total) by mouth daily with breakfast. 11/13/19   Hali Marry, MD  Prenatal Vit-Fe Fumarate-FA (PRENATAL VITAMIN PO) Take by mouth.    [provider]  topiramate (TOPAMAX) 25 MG tablet Take 1 tablet (25 mg total) by mouth 2 (two) times daily. 07/24/19   Hali Marry, MD  zolpidem (AMBIEN) 10 MG tablet Take 0.5 tablets (5 mg total) by mouth at bedtime as needed for sleep. 09/22/19   Hali Marry, MD   Physical Exam: Vitals with BMI 11/28/2019 11/09/2019 11/01/2019  Height - - -  Weight - - -  BMI - - -  Systolic 426 - 834  Diastolic 65 - 73  Pulse 60 63 -    1. General:  in No   Acute distress   Chronically ill -appearing 2. Psychological: Alert and  Oriented 3. Head/ENT:     Dry Mucous Membranes                          Head Non traumatic, neck supple                            Poor Dentition 4. SKIN:  decreased Skin turgor,  Skin clean Dry and intact no rash 5. Heart: Regular rate and rhythm no  Murmur, no Rub or gallop 6. Lungs:  no wheezes or crackles   7. Abdomen: Soft, non-tender, Non distended  obese   bowel sounds present 8. Lower extremities: no clubbing, cyanosis, no edema 9. Neurologically Grossly intact, moving all 4 extremities equally   10. MSK: Normal range of motion   All other LABS:     Recent Labs  Lab  11/22/2019 1915  WBC 18.1*  NEUTROABS 16.0*  HGB 14.2  HCT 46.3*  MCV 100.0  PLT 312     Recent Labs  Lab 11/22/2019 1915  NA 137  K 5.0  CL 106  CO2 11*  GLUCOSE 131*  BUN 57*  CREATININE 4.29*  CALCIUM 8.5*     Recent Labs  Lab 11/01/2019 1915  AST 67*  ALT 25  ALKPHOS 94  BILITOT 0.8  PROT 8.2*  ALBUMIN 3.1*       Cultures: No results found for: SDES, SPECREQUEST, CULT, REPTSTATUS   Radiological Exams on Admission: DG Chest Port 1 View  Result Date: 11/15/2019 CLINICAL DATA:  Shortness of breath. Confusion. COVID positive 3 days ago. EXAM: PORTABLE CHEST 1 VIEW COMPARISON:  Radiograph 07/17/2019 FINDINGS: Patchy bilateral lung opacities in a mid lower lung zone predominant distribution. Heart is normal in size with normal mediastinal contours. No pneumothorax, pleural effusion, or evidence of pulmonary edema. No acute osseous abnormalities are seen. IMPRESSION: Patchy bilateral lung opacities in a pattern consistent with COVID-19 pneumonia. Electronically Signed   By: Keith Rake M.D.   On: 11/04/2019 20:04    Chart has been reviewed    Assessment/Plan  69 y.o. female with medical history significant of asthma, HLD, HTN, PVD  Admitted for COVID pneumonia resulting in respiratory failure and AKI  Present on Admission: . Pneumonia due to COVID-19 virus -   FROM HOME   WITH KNOWN HX OF COVID19  Immunization status: not immunized    Following concerning LAB/ imaging findings:  [place]    11/07/2019 1915 11/15/2019 1944 10/31/2019 2025  DDIMER  --  1.76*  --   FERRITIN  --   --  577*  LDH 457*  --   --   CRP  --   --  11.8*   Hepatic Function Latest Ref Rng & Units 11/27/2019 06/20/2019 01/17/2019  Total Protein 6.5 - 8.1 g/dL 8.2(H) 6.6 6.9  Albumin 3.5 - 5.0 g/dL 3.1(L) - -  AST 15 - 41 U/L 67(H) 21 27  ALT 0 - 44 U/L 25 25 18   Alk Phosphatase 38 - 126 U/L 94 - -  Total Bilirubin 0.3 - 1.2 mg/dL 0.8 0.5 0.8   Lab Results    Component Value Date   CREATININE 4.29 (H) 11/13/2019   CREATININE 1.68 (H) 06/20/2019   CBC    Component Value Date/Time   WBC 18.1 (H) 11/03/2019 1915   PLT 312 11/15/2019 1915   LYMPHSABS 1.2 11/18/2019 1915    Lab Results  Component Value Date   SARSCOV2NAA Detected (A) 11/13/2019    CBC: leukopenia, lymphopenia   ANC/ALC ratio>3.5 BMP: increased BUN/Cr     CRP, LDH: increased   IL-6 and Ferritin increased  Procalcitonin: low  CXR: hazy bilateral peripheral opacities     -Following work-up initiated:      sputum cultures  Ordered 10/31/2019, Blood cultures  Ordered 11/04/2019,     Following complications noted:  elevated troponin noted likely demand ischemia will continue to follow   evidence of AKI - will provide gentle rehydration   nausea/  Diarrhea , decreased PO intake resulting in dehydration - will rehydrate  acute respiratory failure with hypoxia - continue oxygen treatment, advised patient to prone  Plan of treatment: Admit on Airborn Precautions  -given severity of illness initiate steroids solumedrol  And pharmacy consult for remdesivir -  Given  Hypoxia requiring high nasal flow   and no contraindications will attempt a trial of Actemra (discussed with patient risks and benefits)  - Will follow daily d.dimer - Assess for ability to prone  - Supportive management -Fluid sparing resuscitation  -Provide oxygen as needed currently on   SpO2: 94 % O2 Flow Rate (L/min): 15 L/min (12 lpm [place]) - IF d.dimer elvated >5 will increase dose of lovenox   - Consult PCCM if becomes respiratory unstable   Poor Prognostic factors  69 y.o.  Personal hx of  HTN, obesity, NON-Vaccinated status Evidence of  organ damage  Present  elevated trop, AKI, respiratory failure requiring >4L [place]  tachypnea, tachycardia present on admission  ABS neutrophil to lymphocyte ratio >3.5     Will order Airborne and Contact precautions  Family/ patient prognosis discussion: I  have discussed case with the  patient  who are aware of their prognosis At this point they would like to be full code     The treatment plan and use of medications and known side effects were discussed with patient  It was clearly explained that there is no proven definitive treatment for COVID-19 infection yet. Any medications used here are based on case reports/anecdotal data which are not peer-reviewed and has not been studied using randomized control trials.  Complete risks and long-term side effects are unknown, however in the best clinical judgment they seem to be of some clinical benefit rather than medical risks.  Patient   agree with the treatment plan and  want to receive these treatments as indicated.     . Acute respiratory failure with hypoxia ([place]) - likely due to COVID infection Admit to step down  . CKD stage G3b/A2, GFR 30-44 and albumin creatinine ratio 30-299 mg/g - avoid nephrotoxic meds AKI in the setting of Covid infection gently  Rehydrate  . Cough variant asthma  Albuterol as needed  . Essential hypertension, benign -hold lisinopril given AKI hold Toprol given hypotension  . GERD -chronic stable   . Hyperlipidemia -chronic stable continue home medications  . Obesity, Class I, BMI 30-34.9 -chronic increases patient's risk for poor outcome   . Peripheral vascular disease, unspecified ([place]) -chronic stable   . AKI (acute kidney injury) ([place]) obtain urine electrolytes and gently rehydrate most likely secondary to decreased p.o. intake nausea and diarrhea secondary to Covid infection  QT - prolongation - - will monitor on tele avoid QT prolonging medications, rehydrate correct electrolytes  Elevated troponin -  -no chest pain no EKG changes in the setting of  increased work of breathing and   kidney disease likely due to demand ischemia and poor clearance, monitor on telemetry and cycle cardiac enzymes to trend.  if continues to rise will need further  work-up   Other plan as per orders.  DVT prophylaxis:  Lovenox     Code Status:    Code Status: Not on file FULL CODE  comfort care as per patient  I had personally discussed CODE STATUS with patient      Family Communication:   Family not at  Bedside   Disposition Plan:  likely will need placement for rehabilitation                         Overall poor prognosis given rapid onset of hypoxia   Following barriers for discharge:                         Severe hypoxia resolved    Electrolytes corrected\                              home O2, set up                                           Consults called:none  Admission status:  ED Disposition    ED Disposition Condition [place]: [place] [[id number]]  Level of Care: Stepdown [14]  Admit to SDU based on following criteria: Respiratory Distress:  Frequent assessment and/or intervention to maintain adequate ventilation/respiration, pulmonary toilet, and respiratory treatment.  May admit patient to [facility] or [facility] if equivalent level of care is available:: No  Covid Evaluation: Confirmed COVID Positive  Diagnosis: Acute respiratory failure with hypoxia [facility]) [[id number]]  Admitting Physician: Toy Baker [[id number]]  Attending Physician: Toy Baker [[id number]]  Estimated length of stay: past midnight tomorrow  Certification:: I certify this patient will need inpatient services for at least 2 midnights         inpatient     I Expect 2 midnight stay secondary to severity of patient's current illness need for inpatient interventions justified by the following:  hemodynamic instability despite optimal treatment ( hypoxia, )  Severe lab/radiological/exam abnormalities including:  Bilateral PNA  and extensive comorbidities including:  Chronic pain asthma   Obesity  CKD .     That are currently affecting medical management.   I expect  patient to be hospitalized for 2  midnights requiring inpatient medical care.  Patient is at high risk for adverse outcome (such as loss of life or disability) if not treated.  Indication for inpatient stay as follows:  Severe change from baseline regarding mental status Hemodynamic instability despite maximal medical therapy,  inability to maintain oral hydration    New or worsening hypoxia  Need for IV antivirlas IV fluids,    Level of care      SDU tele indefinitely please discontinue once patient no longer qualifies COVID-19 Labs    Lab Results  Component Value Date   SARSCOV2NAA Detected (A) 11/13/2019     Precautions: admitted as   covid positive Airborne and Contact precautions    PPE: Used by the provider:   P100  eye Goggles,  Gloves  gown   Harolyn Cocker 11/17/2019, 12:56 AM    [facility]     after 2 AM please page floor coverage PA If 7AM-7PM, please contact the day team taking care of the patient using [email or web address]   Patient was evaluated in the context of the global COVID-19 pandemic, which necessitated consideration that the patient might be at risk for infection with the SARS-CoV-2 virus that causes COVID-19. Institutional protocols and algorithms that pertain to the evaluation of patients at risk for COVID-19 are in a state of rapid change based on information released by regulatory bodies including the CDC and federal and state organizations. These policies and algorithms were followed during the patient's care.

## 2019-11-16 NOTE — Progress Notes (Addendum)
1750  Security brought pt into clinic, stated pt was at the [employer]. [facility] at [facility]. Pt confused, bilateral hands, feet, lips cyanotic. Vital signs taken BP 82/31 (41) O2 on RA 63%. NRB applied, O2 in the low 80s, [place] applied at 6L O2 low 90s. IV attempted x2 but unsuccessful. Charge RN in ED called for bed. Will transfer to ED when bed available.   1805 Report called to Colin Mulders, RN in ED. Pt transported via stretcher to ED room 18. Pt's dgt called and updated with POC.

## 2019-11-16 NOTE — ED Notes (Signed)
Assumed care of patient at this time, nad noted, sr up x2, bed locked and low, call bell w/I reach.  Will continue to monitor.  ED provider to attempt an ultrasound IV.

## 2019-11-16 NOTE — ED Triage Notes (Signed)
Pt arrived from infusion center, found by in parking lot , lost and confused looking for infusion center. Brought to infusion center, per infusion center pt cyanonic on arrival, 63% on RA, BP 80's/30s.

## 2019-11-16 NOTE — ED Notes (Signed)
Date and time results received: 11/06/2019 2107 (use smartphrase ".now" to insert current time)  Test: lactic Critical Value: 3.1  Name of Provider Notified: Dr. Cherrie Distance  Orders Received? Or Actions Taken?: no new orders at this time

## 2019-11-16 NOTE — Progress Notes (Signed)
Pt. placed on HFNC/[place] @ 12 lpm, remains on NRB mask @ 15 lpm over set up, RN able to > [place] up to 15 lpm to help keep sats per Covid guidelines.

## 2019-11-16 NOTE — ED Provider Notes (Signed)
[facility]-EMERGENCY DEPT Provider Note   CSN: [id number] Arrival date & time: 11/22/2019  1827     History Chief Complaint  Patient presents with   Shortness of Breath    Tracy Massey is a 69 y.o. female.  Presents to ER with concern for shortness of breath.  Patient reports symptoms started last week Friday.  Tested positive on Monday for COVID-19.  Has had generalized fatigue, shortness of breath, cough.  No fevers.  No associated chest pain, no vomiting.  Denies history of smoking, has history of high blood pressure but denies any other chronic medical conditions.  Did not get vaccinated.  Went to Covid infusion center today, per report cyanonic on arrival, 63% on RA, BP 80's/30s.  Patient reports she currently feels comfortable on the oxygen therapy. HPI     Past Medical History:  Diagnosis Date   Asthma    Diverticulosis    Hyperlipidemia    Hypertension    Menopausal syndrome    Neck pain, chronic     Patient Active Problem List   Diagnosis Date Noted   Problem with right great toe 08/15/2019   Closed fracture of base of fifth metacarpal bone of right hand 07/17/2019   Lumbar degenerative disc disease 07/11/2019   Mood disorder (HCC) 07/10/2019   Migraine with aura 06/20/2019   Grief 12/14/2018   Obesity, Class I, BMI 30-34.9 10/13/2018   IFG (impaired fasting glucose) 07/13/2018   Depression, major, single episode, moderate (HCC) 07/13/2018   Encounter for chronic pain management 04/12/2018   Primary insomnia 04/12/2018   Peripheral vascular disease, unspecified (HCC) 12/08/2017   Contrast media allergy 11/24/2017   Blue toes 11/24/2017   Embolism (HCC) 11/24/2017   CKD stage G3b/A2, GFR 30-44 and albumin creatinine ratio 30-299 mg/g 12/16/2015   DDD (degenerative disc disease), cervical 08/19/2015   Microscopic hematuria 04/19/2014   Chronic neck pain 02/24/2011   Myofascial pain syndrome 02/24/2011    HYPERLIPIDEMIA 11/29/2009   POSTMENOPAUSAL STATUS 07/23/2009   ATOPIC RHINITIS 05/30/2009   GERD 05/30/2009   Environmental allergies 05/30/2009   Essential hypertension, benign 02/11/2009   Cough variant asthma 02/11/2009    Past Surgical History:  Procedure Laterality Date   ABDOMINAL HYSTERECTOMY  1986   TAH w/o BSO/endometriosis   APPENDECTOMY  1983   CHOLECYSTECTOMY  1995   lap   HAND SURGERY     HEEL SPUR SURGERY  1996   LT foot   KNEE ARTHROSCOPY  1983   RT knee and LT knee 1986, 1990   TUBAL LIGATION     bilateral     OB History   No obstetric history on file.     Family History  Problem Relation Age of Onset   Hypertension Mother    Clotting disorder Mother    Cancer Father 55       throat/died   Hypertension Daughter    Hypertension Brother    Emphysema Paternal Grandfather     Social History   Tobacco Use   Smoking status: Never Smoker   Smokeless tobacco: Never Used  Substance Use Topics   Alcohol use: Yes    Comment: rare   Drug use: No    Home Medications Prior to Admission medications   Medication Sig Start Date End Date Taking? Authorizing Provider  albuterol (VENTOLIN HFA) 108 (90 Base) MCG/ACT inhaler Inhale 2 puffs into the lungs every 6 (six) hours as needed for wheezing or shortness of breath. 11/15/19  Agapito GamesMetheney, Catherine D, MD  ARIPiprazole (ABILIFY) 10 MG tablet Take 1 tablet (10 mg total) by mouth daily. 10/12/19   Agapito GamesMetheney, Catherine D, MD  atorvastatin (LIPITOR) 10 MG tablet TAKE 1 TABLET AT BEDTIME 07/20/19   Agapito GamesMetheney, Catherine D, MD  DULoxetine (CYMBALTA) 60 MG capsule Take 1 capsule (60 mg total) by mouth daily. 08/17/19   Agapito GamesMetheney, Catherine D, MD  HYDROcodone-acetaminophen (NORCO) 7.5-325 MG tablet Take 1 tablet by mouth every 8 (eight) hours. 11/06/2019   Agapito GamesMetheney, Catherine D, MD  lisinopril (ZESTRIL) 40 MG tablet Take 1 tablet (40 mg total) by mouth daily. 07/28/19   Agapito GamesMetheney, Catherine D, MD  metoprolol  succinate (TOPROL-XL) 25 MG 24 hr tablet TAKE 1 TABLET EVERY DAY 07/20/19   Agapito GamesMetheney, Catherine D, MD  Phentermine HCl (LOMAIRA) 8 MG TABS 1 tab po 30 min before breakfast and 1 tab po at 2 pm daily 08/22/19   [provider]  predniSONE (DELTASONE) 20 MG tablet Take 2 tablets (40 mg total) by mouth daily with breakfast. 11/13/19   Agapito GamesMetheney, Catherine D, MD  Prenatal Vit-Fe Fumarate-FA (PRENATAL VITAMIN PO) Take by mouth.    [provider]  topiramate (TOPAMAX) 25 MG tablet Take 1 tablet (25 mg total) by mouth 2 (two) times daily. 07/24/19   Agapito GamesMetheney, Catherine D, MD  zolpidem (AMBIEN) 10 MG tablet Take 0.5 tablets (5 mg total) by mouth at bedtime as needed for sleep. 09/22/19   Agapito GamesMetheney, Catherine D, MD    Allergies    Other, Keflex [cephalexin], Iodinated diagnostic agents, Metformin and related, Latex, Penicillins, and Sulfa antibiotics  Review of Systems   Review of Systems  Constitutional: Positive for fatigue. Negative for chills and fever.  HENT: Negative for ear pain and sore throat.   Eyes: Negative for pain and visual disturbance.  Respiratory: Positive for cough and shortness of breath.   Cardiovascular: Negative for chest pain and palpitations.  Gastrointestinal: Negative for abdominal pain and vomiting.  Genitourinary: Negative for dysuria and hematuria.  Musculoskeletal: Negative for arthralgias and back pain.  Skin: Negative for color change and rash.  Neurological: Negative for seizures and syncope.  All other systems reviewed and are negative.   Physical Exam Updated Vital Signs BP 122/79    Pulse 74    Resp (!) 24    Ht 5\' 3"  (1.6 m)    Wt 84 kg    SpO2 90%    BMI 32.80 kg/m   Physical Exam Vitals and nursing note reviewed.  Constitutional:      Appearance: She is well-developed.     Comments: Mild tachypnea but no acute distress  HENT:     Head: Normocephalic and atraumatic.  Eyes:     Conjunctiva/sclera: Conjunctivae normal.  Cardiovascular:       Rate and Rhythm: Normal rate and regular rhythm.     Heart sounds: No murmur heard.   Pulmonary:     Comments: Mild tachypnea but no acute distress, bilateral crackles noted 15 L NRB Chest:     Chest wall: No mass or deformity.  Abdominal:     Palpations: Abdomen is soft.     Tenderness: There is no abdominal tenderness.  Musculoskeletal:     Cervical back: Neck supple.  Skin:    General: Skin is warm and dry.     Coloration: Skin is not cyanotic or pale.  Neurological:     General: No focal deficit present.     Mental Status: She is alert.  Psychiatric:  Mood and Affect: Mood normal.        Behavior: Behavior normal.     ED Results / Procedures / Treatments   Labs (all labs ordered are listed, but only abnormal results are displayed) Labs Reviewed  CULTURE, BLOOD (ROUTINE X 2)  CULTURE, BLOOD (ROUTINE X 2)  LACTIC ACID, PLASMA  LACTIC ACID, PLASMA  CBC WITH DIFFERENTIAL/PLATELET  COMPREHENSIVE METABOLIC PANEL  D-DIMER, QUANTITATIVE (NOT AT [facility])  PROCALCITONIN  LACTATE DEHYDROGENASE  FERRITIN  TRIGLYCERIDES  FIBRINOGEN  C-REACTIVE PROTEIN  BRAIN NATRIURETIC PEPTIDE  TROPONIN I (HIGH SENSITIVITY)    EKG EKG Interpretation  Date/Time:  Thursday 2019/12/01 18:46:55 EDT Ventricular Rate:  72 PR Interval:    QRS Duration: 110 QT Interval:  549 QTC Calculation: 601 R Axis:   -13 Text Interpretation: Sinus rhythm Abnormal R-wave progression, early transition LVH with IVCD and secondary repol abnrm Prolonged QT interval Confirmed by Marianna Fuss ([id number]) on 2019-12-01 7:16:02 PM   Radiology No results found.  Procedures .Critical Care Performed by: Milagros Loll, MD Authorized by: Milagros Loll, MD   Critical care provider statement:    Critical care time (minutes):  45   Critical care was necessary to treat or prevent imminent or life-threatening deterioration of the following conditions:  Respiratory failure and renal  failure   Critical care was time spent personally by me on the following activities:  Discussions with consultants, evaluation of patient's response to treatment, examination of patient, ordering and performing treatments and interventions, ordering and review of laboratory studies, ordering and review of radiographic studies, pulse oximetry, re-evaluation of patient's condition, obtaining history from patient or surrogate and review of old charts Ultrasound ED Peripheral IV (Provider)  Date/Time: 12-01-2019 9:20 PM Performed by: Milagros Loll, MD Authorized by: Milagros Loll, MD   Procedure details:    Indications: hydration, multiple failed IV attempts and poor IV access     Location:  Left AC   Angiocath:  18 G   Bedside Ultrasound Guided: Yes     Patient tolerated procedure without complications: Yes     Dressing applied: Yes     (including critical care time)  Medications Ordered in ED Medications  dexamethasone (DECADRON) injection 6 mg (has no administration in time range)    ED Course  I have reviewed the triage vital signs and the nursing notes.  Pertinent labs & imaging results that were available during my care of the patient were reviewed by me and considered in my medical decision making (see chart for details).  Clinical Course as of Nov 15 2117  Thu 12/01/2019  2017 Placed 18 gauge R AC [place] IV   [RD]  2018 WBC(!): 18.1 [RD]  2018 Creatinine(!): 4.29 [RD]  2041 CO2(!): 11 [RD]  2041 Creatinine(!): 4.29 [RD]  2108 Procalcitonin: 0.47 [RD]  2119 D/w Doutova who will admit   [RD]    Clinical Course User Index [RD] Milagros Loll, MD   MDM Rules/Calculators/A&P                          69 year old lady presents to ER with concern for shortness of breath in setting of recently diagnosed COVID-19.  Patient found to be markedly hypoxic, requiring significant oxygen therapy.  Thankfully, patient is not in respiratory distress, has mild tachypnea but  no significant increased work of breathing.  Will start on Decadron therapy, will check basic labs, CXR.  Chest x-ray  concerning for multifocal pneumonia, consistent with Covid pneumonia.  Lab work notable for leukocytosis, suspect most likely from recent course of steroids by PCP.  Creatinine significantly elevated, marked low bicarb, elevated lactic acid, concern for significant dehydration.  Given Covid pneumonia, respiratory failure, hesitant to give large volume fluids, will start with small bolus and maintenance IV fluids. If Cr and acidosis not improving with these interventions, may need additional fluids. Discussed case with hospitalist. Guarded overall prognosis, will need close monitoring in SDU. If any further decompensation, would likely need critical care to get involved.  Attempted to contact family listed as emergency contact but no answer.    Final Clinical Impression(s) / ED Diagnoses Final diagnoses:  Hypoxia  Pneumonia due to COVID-19 virus  Acute respiratory failure with hypoxia (HCC)  Acute renal failure, unspecified acute renal failure type [facility])    Rx / DC Orders ED Discharge Orders    None       Milagros Loll, MD 11/27/2019 2126

## 2019-11-17 ENCOUNTER — Encounter (HOSPITAL_COMMUNITY): Payer: Self-pay | Admitting: Internal Medicine

## 2019-11-17 ENCOUNTER — Other Ambulatory Visit: Payer: Self-pay

## 2019-11-17 ENCOUNTER — Inpatient Hospital Stay (HOSPITAL_COMMUNITY): Payer: Medicare HMO

## 2019-11-17 DIAGNOSIS — E872 Acidosis, unspecified: Secondary | ICD-10-CM | POA: Diagnosis present

## 2019-11-17 DIAGNOSIS — R778 Other specified abnormalities of plasma proteins: Secondary | ICD-10-CM | POA: Diagnosis present

## 2019-11-17 DIAGNOSIS — R9431 Abnormal electrocardiogram [ECG] [EKG]: Secondary | ICD-10-CM | POA: Diagnosis present

## 2019-11-17 DIAGNOSIS — I340 Nonrheumatic mitral (valve) insufficiency: Secondary | ICD-10-CM

## 2019-11-17 DIAGNOSIS — I371 Nonrheumatic pulmonary valve insufficiency: Secondary | ICD-10-CM

## 2019-11-17 LAB — CBC WITH DIFFERENTIAL/PLATELET
Abs Immature Granulocytes: 0.08 10*3/uL — ABNORMAL HIGH (ref 0.00–0.07)
Basophils Absolute: 0 10*3/uL (ref 0.0–0.1)
Basophils Relative: 0 %
Eosinophils Absolute: 0 10*3/uL (ref 0.0–0.5)
Eosinophils Relative: 0 %
HCT: 39.3 % (ref 36.0–46.0)
Hemoglobin: 12.6 g/dL (ref 12.0–15.0)
Immature Granulocytes: 1 %
Lymphocytes Relative: 12 %
Lymphs Abs: 1.4 10*3/uL (ref 0.7–4.0)
MCH: 30.2 pg (ref 26.0–34.0)
MCHC: 32.1 g/dL (ref 30.0–36.0)
MCV: 94.2 fL (ref 80.0–100.0)
Monocytes Absolute: 0.4 10*3/uL (ref 0.1–1.0)
Monocytes Relative: 4 %
Neutro Abs: 9.7 10*3/uL — ABNORMAL HIGH (ref 1.7–7.7)
Neutrophils Relative %: 83 %
Platelets: 260 10*3/uL (ref 150–400)
RBC: 4.17 MIL/uL (ref 3.87–5.11)
RDW: 13.8 % (ref 11.5–15.5)
WBC: 11.6 10*3/uL — ABNORMAL HIGH (ref 4.0–10.5)
nRBC: 0 % (ref 0.0–0.2)

## 2019-11-17 LAB — URINALYSIS, ROUTINE W REFLEX MICROSCOPIC
Bilirubin Urine: NEGATIVE
Glucose, UA: NEGATIVE mg/dL
Ketones, ur: NEGATIVE mg/dL
Nitrite: NEGATIVE
Protein, ur: NEGATIVE mg/dL
Specific Gravity, Urine: 1.009 (ref 1.005–1.030)
pH: 5 (ref 5.0–8.0)

## 2019-11-17 LAB — COMPREHENSIVE METABOLIC PANEL
ALT: 24 U/L (ref 0–44)
AST: 50 U/L — ABNORMAL HIGH (ref 15–41)
Albumin: 2.7 g/dL — ABNORMAL LOW (ref 3.5–5.0)
Alkaline Phosphatase: 80 U/L (ref 38–126)
Anion gap: 18 — ABNORMAL HIGH (ref 5–15)
BUN: 63 mg/dL — ABNORMAL HIGH (ref 8–23)
CO2: 18 mmol/L — ABNORMAL LOW (ref 22–32)
Calcium: 8.4 mg/dL — ABNORMAL LOW (ref 8.9–10.3)
Chloride: 103 mmol/L (ref 98–111)
Creatinine, Ser: 4.42 mg/dL — ABNORMAL HIGH (ref 0.44–1.00)
GFR calc Af Amer: 11 mL/min — ABNORMAL LOW (ref >60)
GFR calc non Af Amer: 9 mL/min — ABNORMAL LOW (ref >60)
Glucose, Bld: 127 mg/dL — ABNORMAL HIGH (ref 70–99)
Potassium: 4.2 mmol/L (ref 3.5–5.1)
Sodium: 139 mmol/L (ref 135–145)
Total Bilirubin: 0.6 mg/dL (ref 0.3–1.2)
Total Protein: 7 g/dL (ref 6.5–8.1)

## 2019-11-17 LAB — ECHOCARDIOGRAM LIMITED
Area-P 1/2: 3.6 cm2
Height: 63 in
S' Lateral: 3.4 cm
Weight: 2962.98 oz

## 2019-11-17 LAB — LACTIC ACID, PLASMA
Lactic Acid, Venous: 2.5 mmol/L (ref 0.5–1.9)
Lactic Acid, Venous: 2.6 mmol/L (ref 0.5–1.9)
Lactic Acid, Venous: 2 mmol/L (ref 0.5–1.9)
Lactic Acid, Venous: 2.5 mmol/L (ref 0.5–1.9)

## 2019-11-17 LAB — HIV ANTIBODY (ROUTINE TESTING W REFLEX): HIV Screen 4th Generation wRfx: NONREACTIVE

## 2019-11-17 LAB — TROPONIN I (HIGH SENSITIVITY)
Troponin I (High Sensitivity): 36 ng/L — ABNORMAL HIGH (ref <18)
Troponin I (High Sensitivity): 32 ng/L — ABNORMAL HIGH (ref <18)
Troponin I (High Sensitivity): 32 ng/L — ABNORMAL HIGH (ref <18)

## 2019-11-17 LAB — CBG MONITORING, ED: Glucose-Capillary: 109 mg/dL — ABNORMAL HIGH (ref 70–99)

## 2019-11-17 LAB — FERRITIN: Ferritin: 574 ng/mL — ABNORMAL HIGH (ref 11–307)

## 2019-11-17 LAB — ABO/RH: ABO/RH(D): AB POS

## 2019-11-17 LAB — C-REACTIVE PROTEIN: CRP: 8.6 mg/dL — ABNORMAL HIGH (ref <1.0)

## 2019-11-17 LAB — D-DIMER, QUANTITATIVE: D-Dimer, Quant: 1.71 ug/mL-FEU — ABNORMAL HIGH (ref 0.00–0.50)

## 2019-11-17 LAB — MAGNESIUM: Magnesium: 1.9 mg/dL (ref 1.7–2.4)

## 2019-11-17 MED ORDER — DOXYCYCLINE HYCLATE 100 MG PO TABS
100.0000 mg | ORAL_TABLET | Freq: Two times a day (BID) | ORAL | Status: DC
  Administered 2019-11-17 – 2019-11-20 (×7): 100 mg via ORAL
  Filled 2019-11-17 (×8): qty 1

## 2019-11-17 MED ORDER — SODIUM CHLORIDE 0.9 % IV BOLUS: 500.0000 mL | Freq: Once | INTRAVENOUS | Status: DC

## 2019-11-17 MED ORDER — PERFLUTREN LIPID MICROSPHERE
1.0000 mL | INTRAVENOUS | Status: AC | PRN
  Administered 2019-11-17: 2 mL via INTRAVENOUS
  Filled 2019-11-17: qty 10

## 2019-11-17 MED ORDER — LACTATED RINGERS IV BOLUS
500.0000 mL | Freq: Once | INTRAVENOUS | Status: AC
  Administered 2019-11-17: 500 mL via INTRAVENOUS

## 2019-11-17 MED ORDER — METHYLPREDNISOLONE SODIUM SUCC 40 MG IJ SOLR
40.0000 mg | Freq: Two times a day (BID) | INTRAMUSCULAR | Status: DC
  Administered 2019-11-17 – 2019-11-23 (×13): 40 mg via INTRAVENOUS
  Filled 2019-11-17 (×14): qty 1

## 2019-11-17 NOTE — ED Notes (Signed)
This RN attempted to assist the patient to the prone position for increased oxygenation, but the patient refused stating that she can not lay flat on her stomach, she could only turn onto her side.  Admitting provider aware.

## 2019-11-17 NOTE — Progress Notes (Signed)
  Echocardiogram 2D Echocardiogram has been performed.  Pieter Partridge 11/17/2019, 12:07 PM

## 2019-11-17 NOTE — ED Notes (Signed)
Patient received dinner tray 

## 2019-11-17 NOTE — ED Notes (Signed)
Patient urinated and had BM. Urine was unable to be collected.

## 2019-11-17 NOTE — Progress Notes (Addendum)
PROGRESS NOTE  Tracy Massey [facility]:[id number] DOB: [birth year] DOA: 11/03/2019  PCP: Hali Marry, MD  Brief History/Interval Summary: 69 y.o. female with medical history significant of asthma, HLD, HTN, PVD presented with shortness of breath and was noted to be confused as well.  She apparently was diagnosed with COVID-19 a few days ago.  Her symptoms started 6 days prior to admission.  Apparently she was on the way to the infusion center to get monoclonal antibodies but then she apparently got confused and got lost in the parking lot.  She was noted to be cyanotic and hypotensive and hypoxic.  She was promptly taken to the emergency department.  She was noted to be profoundly hypoxic and required 15 L of oxygen.  She was subsequently hospitalized for further management.  She has not yet been vaccinated against COVID-19.  Reason for Visit: Acute respiratory failure with hypoxia.  Pneumonia due to COVID-19  Consultants: None  Procedures: None  Antibiotics: Anti-infectives (From admission, onward)   Start     Dose/Rate Route Frequency Ordered Stop   11/17/19 1000  remdesivir 100 mg in sodium chloride 0.9 % 100 mL IVPB  Status:  Discontinued       "Followed by" Linked Group Details   100 mg 200 mL/hr over 30 Minutes Intravenous Daily 11/05/2019 2253 11/28/2019 2256   11/17/19 1000  remdesivir 100 mg in sodium chloride 0.9 % 100 mL IVPB       "Followed by" Linked Group Details   100 mg 200 mL/hr over 30 Minutes Intravenous Daily 11/13/2019 2147 11/21/19 0959   11/17/2019 2253  remdesivir 200 mg in sodium chloride 0.9% 250 mL IVPB  Status:  Discontinued       "Followed by" Linked Group Details   200 mg 580 mL/hr over 30 Minutes Intravenous Once 11/01/2019 2253 11/27/2019 2256   11/11/2019 2200  remdesivir 200 mg in sodium chloride 0.9% 250 mL IVPB       "Followed by" Linked Group Details   200 mg 580 mL/hr over 30 Minutes Intravenous Once 11/08/2019 2147 11/20/2019 2316       Subjective/Interval History: Patient somewhat distracted.  Denies any chest pain.  Does admit to some shortness of breath especially when she coughs.  Some nausea but no vomiting.  Has had some diarrhea at home and has had very poor oral intake the last several days.    Assessment/Plan:  Acute Hypoxic Resp. Failure/Pneumonia due to COVID-19  Recent Labs  Lab 11/03/2019 1915 11/20/2019 1944 11/17/2019 2025 11/17/19 0525  DDIMER  --  1.76*  --  1.71*  FERRITIN  --   --  577* 574*  CRP  --   --  11.8* 8.6*  ALT 25  --   --  24  PROCALCITON  --  0.47  --   --     Objective findings: Fever: Documented in the emergency department Oxygen requirements: Currently on HFNC 15 L along with nonrebreather.  Saturating in the late 80s to early 90s.  COVID 19 Therapeutics: Antibacterials: Procalcitonin noted to be 0.47.  Allergy to penicillin and cephalosporins noted.  QT prolongation noted.  We will place her on doxycycline for now. Remdesivir: Day 2 Steroids: On Solu-Medrol 40 mg twice a day Diuretics: None Actemra/Baricitinib: Received Actemra on 8/19 PUD Prophylaxis: None DVT Prophylaxis:  Lovenox 30 mg daily  Patient not requiring a lot of oxygen at this time.  She remains on Remdesivir and steroids.  She also received a dose of  Actemra yesterday.  Procalcitonin noted to be 0.47.  WBC noted to be elevated.   Allergy to penicillin and cephalosporins noted.  QT prolongation noted.  We will place her on doxycycline for now. 5-day course should suffice.  Patient's respiratory status is very tenuous.  May need to change her to heated high flow depending on her oxygen saturations.  Inflammatory markers noted to be elevated.  D-dimer 1.71 which is most likely due to COVID-19.  Continue to trend.  Incentive spirometry, prone positioning, mobilization, out of bed to chair as tolerated.  The treatment plan and use of medications and known side effects were discussed with patient/family. Some of  the medications used are based on case reports/anecdotal data.  All other medications being used in the management of COVID-19 based on limited study data.  Complete risks and long-term side effects are unknown, however in the best clinical judgment they seem to be of some benefit.  Patient/family wanted to proceed with treatment options provided.  Acute kidney injury on chronic kidney disease stage IIIb/metabolic acidosis Based on review of old records it looks like her baseline creatinine is close to 1.5-1.6.  She presented with a creatinine of 4.29.  Noted to be 4.42 today.  It appears that this is most likely due to hypovolemia in the setting of use of ACE inhibitor.  Patient has had a lot of diarrhea prior to admission and also had very poor oral intake.  We will give her fluid bolus today.  Due to renal ultrasound.  Bladder scan if she does not make any urine.  May need to involve nephrology if she does not improve.  Metabolic acidosis is most likely due to renal failure.  Essential hypertension Holding lisinopril.  Monitor blood pressures closely.  Lactic acidosis Had a lactic acid level of 3.1.  Improved to 2.0 this morning.  Continue aggressive fluid resuscitation.  History of cough variant asthma Albuterol as needed.  History of hyperlipidemia Continue statin.  Monitor LFTs.  Mildly elevated troponin Most likely due to demand ischemia from acute illness.  He shows nonspecific T wave changes.  Patient denies any chest pain.  Consider echocardiogram.  QT prolongation Avoid QT prolonging medication as much as possible.  History of peripheral vascular disease Appears to be stable.  Questionable psychiatric history Patient noted to be on Abilify as well as Topamax.  Also noted to be on Cymbalta.  Will need to be verified.  Obesity Estimated body mass index is 32.8 kg/m as calculated from the following:   Height as of this encounter: 5' 3"  (1.6 m).   Weight as of this encounter:  84 kg.    DVT Prophylaxis: Lovenox Code Status: Full code Family Communication: Discussed with the patient.  We will try to call her family later today. Disposition Plan: Hopefully return home when improved  Status is: Inpatient  Remains inpatient appropriate because:IV treatments appropriate due to intensity of illness or inability to take PO and Inpatient level of care appropriate due to severity of illness   Dispo: The patient is from: Home              Anticipated d/c is to: Home              Anticipated d/c date is: > 3 days              Patient currently is not medically stable to d/c.     Medications:  Scheduled: . ARIPiprazole  10 mg Oral Daily  .  vitamin C  500 mg Oral Daily  . atorvastatin  10 mg Oral QHS  . chlorhexidine gluconate (MEDLINE KIT)  15 mL Mouth Rinse BID  . DULoxetine  60 mg Oral Daily  . enoxaparin (LOVENOX) injection  30 mg Subcutaneous Q24H  . [START ON 2019-12-14] HYDROcodone-acetaminophen  1 tablet Oral Q8H  . mouth rinse  15 mL Mouth Rinse 10 times per day  . methylPREDNISolone (SOLU-MEDROL) injection  40 mg Intravenous Q12H  . sodium chloride flush  3 mL Intravenous Q12H  . topiramate  25 mg Oral BID  . zinc sulfate  220 mg Oral Daily   Continuous: . lactated ringers 100 mL/hr at 11/17/19 0806  . remdesivir 100 mg in NS 100 mL Stopped (11/17/19 1106)   OEV:[id number], albuterol, guaiFENesin-dextromethorphan   Objective:  Vital Signs  Vitals:   11/17/19 0430 11/17/19 0636 11/17/19 0900 11/17/19 1025  BP: 103/79 120/67 123/62 (!) 169/96  Pulse: 65 84 71 (!) 44  Resp: 16 (!) 22 (!) 28 (!) 25  SpO2: 90% (!) 88% (!) 88% 96%  Weight:      Height:        Intake/Output Summary (Last 24 hours) at 11/17/2019 1102 Last data filed at 11/19/2019 2346 Gross per 24 hour  Intake 837.12 ml  Output --  Net 837.12 ml   Filed Weights   10/30/2019 1856  Weight: 84 kg    General appearance: Awake alert.  In no distress Resp:.  Coarse  breath sound bilaterally with crackles at the bases.  No wheezing or rhonchi. Cardio: S1-S2 is normal regular.  No S3-S4.  No rubs murmurs or bruit GI: Abdomen is soft.  Nontender nondistended.  Bowel sounds are present normal.  No masses organomegaly Extremities: No edema.  Full range of motion of lower extremities. Neurologic:  No focal neurological deficits.    Lab Results:  Data Reviewed: I have personally reviewed following labs and imaging studies  CBC: Recent Labs  Lab 11/07/2019 1915 11/17/19 0525  WBC 18.1* 11.6*  NEUTROABS 16.0* 9.7*  HGB 14.2 12.6  HCT 46.3* 39.3  MCV 100.0 94.2  PLT 312 371    Basic Metabolic Panel: Recent Labs  Lab 10/29/2019 1915 11/17/19 0525  NA 137 139  K 5.0 4.2  CL 106 103  CO2 11* 18*  GLUCOSE 131* 127*  BUN 57* 63*  CREATININE 4.29* 4.42*  CALCIUM 8.5* 8.4*  MG  --  1.9    GFR: Estimated Creatinine Clearance: 12.2 mL/min (A) (by C-G formula based on SCr of 4.42 mg/dL (H)).  Liver Function Tests: Recent Labs  Lab 11/14/2019 1915 11/17/19 0525  AST 67* 50*  ALT 25 24  ALKPHOS 94 80  BILITOT 0.8 0.6  PROT 8.2* 7.0  ALBUMIN 3.1* 2.7*    CBG: Recent Labs  Lab 11/17/19 0754  GLUCAP 109*    Lipid Profile: Recent Labs    11/07/2019 1944  TRIG 193*    Anemia Panel: Recent Labs    11/27/2019 2025 11/17/19 0525  FERRITIN 577* 574*    Recent Results (from the past 240 hour(s))  Novel Coronavirus, NAA ([facility])     Status: Abnormal   Collection Time: 11/13/19 12:00 AM   Specimen: Nasal Swab; Saline   Saline  Is this test f  Result Value Ref Range Status   SARS-CoV-2, NAA Detected (A) Not Detected Final    Comment: Patients who have a positive COVID-19 test result may now have treatment options. Treatment options are available for patients  with mild to moderate symptoms and for hospitalized patients. Visit our website at [email or web address] for resources and information. This nucleic acid amplification  test was developed and its performance characteristics determined by [employer]. Nucleic acid amplification tests include RT-PCR and TMA. This test has not been FDA cleared or approved. This test has been authorized by FDA under an Emergency Use Authorization (EUA). This test is only authorized for the duration of time the declaration that circumstances exist justifying the authorization of the emergency use of in vitro diagnostic tests for detection of SARS-CoV-2 virus and/or diagnosis of COVID-19 infection under section 564(b)(1) of the Act, 21 U.S.C. 960AVW-0(J) (1), unless the authorization is terminated or revoked sooner. When diagnostic testing is negativ e, the possibility of a false negative result should be considered in the context of a patient's recent exposures and the presence of clinical signs and symptoms consistent with COVID-19. An individual without symptoms of COVID-19 and who is not shedding SARS-CoV-2 virus would expect to have a negative (not detected) result in this assay.   SARS-COV-2, NAA 2 DAY TAT     Status: None   Collection Time: 11/13/19 12:00 AM   Saline  Is this test f  Result Value Ref Range Status   SARS-CoV-2, NAA 2 DAY TAT Performed  Final      Radiology Studies: US RENAL  Result Date: 11/17/2019 CLINICAL DATA:  69 year old female with acute renal injury. EXAM: RENAL / URINARY TRACT ULTRASOUND COMPLETE COMPARISON:  Renal ultrasound 07/25/2018. FINDINGS: Right Kidney: Renal measurements: 8.7 x 5.2 x 4.7 cm = volume: 106 mL. Cortical echogenicity within normal limits. Stable cortical thickness. No right hydronephrosis or renal lesion. Left Kidney: Renal measurements: 8.0 x 7.3 x 4.4 cm = volume: 128 mL. Cortical echogenicity within normal limits. Stable cortical thickness. Benign appearing 2 cm left renal midpole cyst redemonstrated (image 14). No hydronephrosis or other left renal lesion. Bladder: Appears normal for degree of bladder  distention. Other: None. IMPRESSION: 1. No acute renal findings.  Normal bladder. 2. Stable since last year and largely unremarkable for age ultrasound appearance of both kidneys. Electronically Signed   By: Genevie Ann M.D.   On: 11/17/2019 10:30   DG Chest Port 1 View  Result Date: 11/17/2019 CLINICAL DATA:  Shortness of breath. Confusion. COVID positive 3 days ago. EXAM: PORTABLE CHEST 1 VIEW COMPARISON:  Radiograph 07/17/2019 FINDINGS: Patchy bilateral lung opacities in a mid lower lung zone predominant distribution. Heart is normal in size with normal mediastinal contours. No pneumothorax, pleural effusion, or evidence of pulmonary edema. No acute osseous abnormalities are seen. IMPRESSION: Patchy bilateral lung opacities in a pattern consistent with COVID-19 pneumonia. Electronically Signed   By: Keith Rake M.D.   On: 11/06/2019 20:04       LOS: 1 day   [facility] Pager on [email or web address]  11/17/2019, 11:02 AM

## 2019-11-18 LAB — COMPREHENSIVE METABOLIC PANEL
ALT: 28 U/L (ref 0–44)
AST: 56 U/L — ABNORMAL HIGH (ref 15–41)
Albumin: 2.6 g/dL — ABNORMAL LOW (ref 3.5–5.0)
Alkaline Phosphatase: 81 U/L (ref 38–126)
Anion gap: 13 (ref 5–15)
BUN: 66 mg/dL — ABNORMAL HIGH (ref 8–23)
CO2: 20 mmol/L — ABNORMAL LOW (ref 22–32)
Calcium: 8 mg/dL — ABNORMAL LOW (ref 8.9–10.3)
Chloride: 109 mmol/L (ref 98–111)
Creatinine, Ser: 2.75 mg/dL — ABNORMAL HIGH (ref 0.44–1.00)
GFR calc Af Amer: 19 mL/min — ABNORMAL LOW (ref >60)
GFR calc non Af Amer: 17 mL/min — ABNORMAL LOW (ref >60)
Glucose, Bld: 130 mg/dL — ABNORMAL HIGH (ref 70–99)
Potassium: 4.1 mmol/L (ref 3.5–5.1)
Sodium: 142 mmol/L (ref 135–145)
Total Bilirubin: 0.7 mg/dL (ref 0.3–1.2)
Total Protein: 6.8 g/dL (ref 6.5–8.1)

## 2019-11-18 LAB — CBC WITH DIFFERENTIAL/PLATELET
Abs Immature Granulocytes: 0.12 10*3/uL — ABNORMAL HIGH (ref 0.00–0.07)
Basophils Absolute: 0 10*3/uL (ref 0.0–0.1)
Basophils Relative: 0 %
Eosinophils Absolute: 0 10*3/uL (ref 0.0–0.5)
Eosinophils Relative: 0 %
HCT: 39.3 % (ref 36.0–46.0)
Hemoglobin: 12.9 g/dL (ref 12.0–15.0)
Immature Granulocytes: 1 %
Lymphocytes Relative: 12 %
Lymphs Abs: 1.3 10*3/uL (ref 0.7–4.0)
MCH: 30.3 pg (ref 26.0–34.0)
MCHC: 32.8 g/dL (ref 30.0–36.0)
MCV: 92.3 fL (ref 80.0–100.0)
Monocytes Absolute: 0.5 10*3/uL (ref 0.1–1.0)
Monocytes Relative: 4 %
Neutro Abs: 8.6 10*3/uL — ABNORMAL HIGH (ref 1.7–7.7)
Neutrophils Relative %: 83 %
Platelets: 275 10*3/uL (ref 150–400)
RBC: 4.26 MIL/uL (ref 3.87–5.11)
RDW: 13.8 % (ref 11.5–15.5)
WBC: 10.5 10*3/uL (ref 4.0–10.5)
nRBC: 0 % (ref 0.0–0.2)

## 2019-11-18 LAB — BLOOD CULTURE ID PANEL (REFLEXED) - BCID2

## 2019-11-18 LAB — D-DIMER, QUANTITATIVE: D-Dimer, Quant: 2.42 ug/mL-FEU — ABNORMAL HIGH (ref 0.00–0.50)

## 2019-11-18 LAB — MAGNESIUM: Magnesium: 1.8 mg/dL (ref 1.7–2.4)

## 2019-11-18 LAB — MRSA PCR SCREENING: MRSA by PCR: POSITIVE — AB

## 2019-11-18 LAB — FERRITIN: Ferritin: 547 ng/mL — ABNORMAL HIGH (ref 11–307)

## 2019-11-18 LAB — C-REACTIVE PROTEIN: CRP: 6.4 mg/dL — ABNORMAL HIGH (ref <1.0)

## 2019-11-18 MED ORDER — PHENOL 1.4 % MT LIQD
1.0000 | OROMUCOSAL | Status: DC | PRN
  Filled 2019-11-18: qty 177

## 2019-11-18 MED ORDER — CHLORHEXIDINE GLUCONATE CLOTH 2 % EX PADS
6.0000 | MEDICATED_PAD | Freq: Every day | CUTANEOUS | Status: DC
  Administered 2019-11-19 – 2019-11-23 (×3): 6 via TOPICAL

## 2019-11-18 MED ORDER — METOPROLOL SUCCINATE ER 25 MG PO TB24
25.0000 mg | ORAL_TABLET | Freq: Every day | ORAL | Status: DC
  Administered 2019-11-18 – 2019-11-19 (×2): 25 mg via ORAL
  Filled 2019-11-18 (×2): qty 1

## 2019-11-18 MED ORDER — MUPIROCIN 2 % EX OINT
1.0000 "application " | TOPICAL_OINTMENT | Freq: Two times a day (BID) | CUTANEOUS | Status: AC
  Administered 2019-11-18 – 2019-11-23 (×10): 1 via NASAL
  Filled 2019-11-18: qty 22

## 2019-11-18 MED ORDER — HYDRALAZINE HCL 20 MG/ML IJ SOLN
10.0000 mg | Freq: Four times a day (QID) | INTRAMUSCULAR | Status: DC | PRN
  Administered 2019-11-18 – 2019-11-20 (×4): 10 mg via INTRAVENOUS
  Filled 2019-11-18 (×4): qty 1

## 2019-11-18 MED ORDER — CHLORHEXIDINE GLUCONATE CLOTH 2 % EX PADS
6.0000 | MEDICATED_PAD | Freq: Every day | CUTANEOUS | Status: DC
  Administered 2019-11-18 – 2019-11-21 (×3): 6 via TOPICAL

## 2019-11-18 MED ORDER — ADULT MULTIVITAMIN W/MINERALS CH
1.0000 | ORAL_TABLET | ORAL | Status: DC
  Administered 2019-11-19 – 2019-11-20 (×2): 1 via ORAL
  Filled 2019-11-18 (×3): qty 1

## 2019-11-18 MED ORDER — PROSOURCE PLUS PO LIQD
30.0000 mL | Freq: Three times a day (TID) | ORAL | Status: DC
  Administered 2019-11-19 (×2): 30 mL via ORAL
  Filled 2019-11-18 (×4): qty 30

## 2019-11-18 MED ORDER — BOOST / RESOURCE BREEZE PO LIQD CUSTOM
1.0000 | Freq: Three times a day (TID) | ORAL | Status: DC
  Administered 2019-11-19 – 2019-11-20 (×3): 1 via ORAL

## 2019-11-18 NOTE — Progress Notes (Signed)
PHARMACY - PHYSICIAN COMMUNICATION CRITICAL VALUE ALERT - BLOOD CULTURE IDENTIFICATION (BCID)  Tracy Massey is an 69 y.o. female who presented to [facility] on 11/06/2019 with a chief complaint of hypoxia, confusion.   Assessment: Pt with COVID PNA found cyanotic and confused in hospital parking lot on way to infusion center for mAb.  Currently on remdesivir. Anaerobic bottle of 1/2 Blood cx now + GPC (BCID + MRSE). Likely contaminant.   Name of physician (or Provider) Contacted: Linton Flemings, NP  Current antibiotics: none  Changes to prescribed antibiotics recommended:  No antibiotics recommended.  Cont to monitor.   Results for orders placed or performed during the hospital encounter of 11/02/2019  Blood Culture ID Panel (Reflexed) (Collected: 11/17/2019  8:25 PM)  Result Value Ref Range   Enterococcus faecalis NOT DETECTED NOT DETECTED   Enterococcus Faecium NOT DETECTED NOT DETECTED   Listeria monocytogenes NOT DETECTED NOT DETECTED   Staphylococcus species DETECTED (A) NOT DETECTED   Staphylococcus aureus (BCID) NOT DETECTED NOT DETECTED   Staphylococcus epidermidis DETECTED (A) NOT DETECTED   Staphylococcus lugdunensis NOT DETECTED NOT DETECTED   Streptococcus species NOT DETECTED NOT DETECTED   Streptococcus agalactiae NOT DETECTED NOT DETECTED   Streptococcus pneumoniae NOT DETECTED NOT DETECTED   Streptococcus pyogenes NOT DETECTED NOT DETECTED   A.calcoaceticus-baumannii NOT DETECTED NOT DETECTED   Bacteroides fragilis NOT DETECTED NOT DETECTED   Enterobacterales NOT DETECTED NOT DETECTED   Enterobacter cloacae complex NOT DETECTED NOT DETECTED   Escherichia coli NOT DETECTED NOT DETECTED   Klebsiella aerogenes NOT DETECTED NOT DETECTED   Klebsiella oxytoca NOT DETECTED NOT DETECTED   Klebsiella pneumoniae NOT DETECTED NOT DETECTED   Proteus species NOT DETECTED NOT DETECTED   Salmonella species NOT DETECTED NOT DETECTED   Serratia marcescens NOT DETECTED NOT  DETECTED   Haemophilus influenzae NOT DETECTED NOT DETECTED   Neisseria meningitidis NOT DETECTED NOT DETECTED   Pseudomonas aeruginosa NOT DETECTED NOT DETECTED   Stenotrophomonas maltophilia NOT DETECTED NOT DETECTED   Candida albicans NOT DETECTED NOT DETECTED   Candida auris NOT DETECTED NOT DETECTED   Candida glabrata NOT DETECTED NOT DETECTED   Candida krusei NOT DETECTED NOT DETECTED   Candida parapsilosis NOT DETECTED NOT DETECTED   Candida tropicalis NOT DETECTED NOT DETECTED   Cryptococcus neoformans/gattii NOT DETECTED NOT DETECTED   Methicillin resistance mecA/C DETECTED (A) NOT DETECTED    Junita Push PharmD, BCPS 11/18/2019  3:33 AM

## 2019-11-18 NOTE — Progress Notes (Signed)
 PROGRESS NOTE  Tracy Massey MRN:[id number] DOB: 12/18/1950 DOA: 11/06/2019  PCP: Metheney, Catherine D, MD  Brief History/Interval Summary: 68 y.o. female with medical history significant of asthma, HLD, HTN, PVD presented with shortness of breath and was noted to be confused as well.  She apparently was diagnosed with COVID-19 a few days ago.  Her symptoms started 6 days prior to admission.  Apparently she was on the way to the infusion center to get monoclonal antibodies but then she apparently got confused and got lost in the parking lot.  She was noted to be cyanotic and hypotensive and hypoxic.  She was promptly taken to the emergency department.  She was noted to be profoundly hypoxic and required 15 L of oxygen.  She was subsequently hospitalized for further management.  She has not yet been vaccinated against COVID-19.  Reason for Visit: Acute respiratory failure with hypoxia.  Pneumonia due to COVID-19  Consultants: None  Procedures: None  Antibiotics: Anti-infectives (From admission, onward)   Start     Dose/Rate Route Frequency Ordered Stop   11/17/19 1200  doxycycline (VIBRA-TABS) tablet 100 mg        100 mg Oral Every 12 hours 11/17/19 1117     11/17/19 1000  remdesivir 100 mg in sodium chloride 0.9 % 100 mL IVPB  Status:  Discontinued       "Followed by" Linked Group Details   100 mg 200 mL/hr over 30 Minutes Intravenous Daily 11/12/2019 2253 11/26/2019 2256   11/17/19 1000  remdesivir 100 mg in sodium chloride 0.9 % 100 mL IVPB       "Followed by" Linked Group Details   100 mg 200 mL/hr over 30 Minutes Intravenous Daily 11/17/2019 2147 11/21/19 0959   11/09/2019 2253  remdesivir 200 mg in sodium chloride 0.9% 250 mL IVPB  Status:  Discontinued       "Followed by" Linked Group Details   200 mg 580 mL/hr over 30 Minutes Intravenous Once 10/30/2019 2253 11/21/2019 2256   11/18/2019 2200  remdesivir 200 mg in sodium chloride 0.9% 250 mL IVPB       "Followed by" Linked Group  Details   200 mg 580 mL/hr over 30 Minutes Intravenous Once 10/31/2019 2147 11/19/2019 2316      Subjective/Interval History: Patient continues to complain of shortness of breath.  Denies any chest pain.  Some nausea but no vomiting.  No diarrhea here in the hospital.  Denies any abdominal pain.  Continues to have cough with clear expectoration.    Assessment/Plan:  Acute Hypoxic Resp. Failure/Pneumonia due to COVID-19  Recent Labs  Lab 11/06/2019 1915 11/22/2019 1944 11/13/2019 2025 11/17/19 0525 11/18/19 0500  DDIMER  --  1.76*  --  1.71* 2.42*  FERRITIN  --   --  577* 574* 547*  CRP  --   --  11.8* 8.6* 6.4*  ALT 25  --   --  24 28  PROCALCITON  --  0.47  --   --   --     Objective findings: Fever: Remains afebrile Oxygen requirements: HFNC 15 L plus nonrebreather.  Saturating in the early 90s.    COVID 19 Therapeutics: Antibacterials: Procalcitonin noted to be 0.47.  Allergy to penicillin and cephalosporins noted.  QT prolongation noted.  She was placed on doxycycline. Remdesivir: Day 3 Steroids: On Solu-Medrol 40 mg twice a day Diuretics: None Actemra/Baricitinib: Received Actemra on 8/19 PUD Prophylaxis: None DVT Prophylaxis:  Lovenox 30 mg daily  Patient remains on   high flow oxygen at 15 L/min plus nonrebreather.  She saturating in the late 80s to the 90s.  Continue Remdesivir and steroids.  She received Actemra on 8/19.  Elevated D-dimer is due to COVID-19.  CRP noted to be slightly better today.  Continue doxycycline for now.  Respiratory status remains tenuous.  May need to change to heated high flow if his oxygen saturations remain low.  Continue to monitor closely.  Incentive spirometry, prone positioning, mobilization, out of bed to chair as tolerated.  The treatment plan and use of medications and known side effects were discussed with patient/family. Some of the medications used are based on case reports/anecdotal data.  All other medications being used in the  management of COVID-19 based on limited study data.  Complete risks and long-term side effects are unknown, however in the best clinical judgment they seem to be of some benefit.  Patient/family wanted to proceed with treatment options provided.  Acute kidney injury on chronic kidney disease stage IIIb/metabolic acidosis Based on review of old records it looks like her baseline creatinine is close to 1.5-1.6.  She presented with a creatinine of 4.29.   Patient had reported poor oral intake and multiple episodes of loose stool.  Her acute renal failure was thought to be due to hypovolemia.  She was aggressively hydrated.  Renal ultrasound did not show any hydronephrosis. After IV hydration she did start making urine.  UA reviewed. Renal function has improved this morning.  BUN remains elevated.  Cut back on IV fluids to avoid fluid overload.  Bicarbonate level has improved as well.  Continue to monitor urine output.  Avoid nephrotoxic agents.  Essential hypertension Holding lisinopril.  Blood pressure noted to be slightly elevated.  Will not be too aggressive especially considering her renal insufficiency.  Lactic acidosis Likely due to hypovolemia.  Improved with IV hydration.    History of cough variant asthma Albuterol as needed.  History of hyperlipidemia Continue statin.  AST noted to be mildly elevated..  Mildly elevated troponin Most likely due to demand ischemia from acute illness.  EKG showed nonspecific T wave changes.  Patient denies any chest pain.  Echocardiogram shows normal systolic function.  Right ventricle was also normal.  No further cardiac work-up at this time.   QT prolongation Avoid QT prolonging medication as much as possible.  History of peripheral vascular disease Appears to be stable.  Questionable psychiatric history Patient noted to be on Abilify as well as Topamax.  Also noted to be on Cymbalta.    Obesity Estimated body mass index is 31.83 kg/m as  calculated from the following:   Height as of this encounter: 5' 2" (1.575 m).   Weight as of this encounter: 78.9 kg.    DVT Prophylaxis: Lovenox Code Status: Full code Family Communication: Discussed with the patient.  Daughter will be updated later today. Disposition Plan: Hopefully return home when improved  Status is: Inpatient  Remains inpatient appropriate because:IV treatments appropriate due to intensity of illness or inability to take PO and Inpatient level of care appropriate due to severity of illness   Dispo:  Patient From: Home  Planned Disposition: Home  Expected discharge date: 11/21/19  Medically stable for discharge: No       Medications:  Scheduled: . ARIPiprazole  10 mg Oral Daily  . vitamin C  500 mg Oral Daily  . atorvastatin  10 mg Oral QHS  . chlorhexidine gluconate (MEDLINE KIT)  15 mL Mouth Rinse BID  .  doxycycline  100 mg Oral Q12H  . DULoxetine  60 mg Oral Daily  . enoxaparin (LOVENOX) injection  30 mg Subcutaneous Q24H  . [START ON 11/20/2019] HYDROcodone-acetaminophen  1 tablet Oral Q8H  . mouth rinse  15 mL Mouth Rinse 10 times per day  . methylPREDNISolone (SOLU-MEDROL) injection  40 mg Intravenous Q12H  . sodium chloride flush  3 mL Intravenous Q12H  . topiramate  25 mg Oral BID  . zinc sulfate  220 mg Oral Daily   Continuous: . lactated ringers 100 mL/hr at 11/18/19 0444  . remdesivir 100 mg in NS 100 mL Stopped (11/17/19 1106)   PRN:acetaminophen, albuterol, guaiFENesin-dextromethorphan, phenol   Objective:  Vital Signs  Vitals:   11/18/19 0726 11/18/19 0812 11/18/19 0832 11/18/19 0940  BP: (!) 150/72 (!) 163/120 (!) 155/76 (!) 155/76  Pulse: (!) 53 80 (!) 10 82  Resp: (!) 27 18 (!) 22 (!) 29  Temp:      TempSrc:      SpO2: 90% (!) 89% 90% 90%  Weight:      Height:       No intake or output data in the 24 hours ending 11/18/19 1014 Filed Weights   10/30/2019 1856 11/17/19 1241  Weight: 84 kg 78.9 kg    General  appearance: Awake alert.  In no distress Resp: Noted to be tachypneic.  Coarse breath sounds with crackles at the bases.  No wheezing or rhonchi.   Cardio: S1-S2 is normal regular.  No S3-S4.  No rubs murmurs or bruit GI: Abdomen is soft.  Nontender nondistended.  Bowel sounds are present normal.  No masses organomegaly Extremities: No edema.  Full range of motion of lower extremities. Neurologic: No focal neurological deficits.     Lab Results:  Data Reviewed: I have personally reviewed following labs and imaging studies  CBC: Recent Labs  Lab 11/05/2019 1915 11/17/19 0525 11/18/19 0500  WBC 18.1* 11.6* 10.5  NEUTROABS 16.0* 9.7* 8.6*  HGB 14.2 12.6 12.9  HCT 46.3* 39.3 39.3  MCV 100.0 94.2 92.3  PLT 312 260 275    Basic Metabolic Panel: Recent Labs  Lab 11/12/2019 1915 11/17/19 0525 11/18/19 0500  NA 137 139 142  K 5.0 4.2 4.1  CL 106 103 109  CO2 11* 18* 20*  GLUCOSE 131* 127* 130*  BUN 57* 63* 66*  CREATININE 4.29* 4.42* 2.75*  CALCIUM 8.5* 8.4* 8.0*  MG  --  1.9 1.8    GFR: Estimated Creatinine Clearance: 18.5 mL/min (A) (by C-G formula based on SCr of 2.75 mg/dL (H)).  Liver Function Tests: Recent Labs  Lab 11/18/2019 1915 11/17/19 0525 11/18/19 0500  AST 67* 50* 56*  ALT 25 24 28  ALKPHOS 94 80 81  BILITOT 0.8 0.6 0.7  PROT 8.2* 7.0 6.8  ALBUMIN 3.1* 2.7* 2.6*    CBG: Recent Labs  Lab 11/17/19 0754  GLUCAP 109*    Lipid Profile: Recent Labs    11/20/2019 1944  TRIG 193*    Anemia Panel: Recent Labs    11/17/19 0525 11/18/19 0500  FERRITIN 574* 547*    Recent Results (from the past 240 hour(s))  Novel Coronavirus, NAA ([facility])     Status: Abnormal   Collection Time: 11/13/19 12:00 AM   Specimen: Nasal Swab; Saline   Saline  Is this test f  Result Value Ref Range Status   SARS-CoV-2, NAA Detected (A) Not Detected Final    Comment: Patients who have a positive COVID-19 test result   may now have treatment options. Treatment options  are available for patients with mild to moderate symptoms and for hospitalized patients. Visit our website at [email or web address] for resources and information. This nucleic acid amplification test was developed and its performance characteristics determined by [employer]. Nucleic acid amplification tests include RT-PCR and TMA. This test has not been FDA cleared or approved. This test has been authorized by FDA under an Emergency Use Authorization (EUA). This test is only authorized for the duration of time the declaration that circumstances exist justifying the authorization of the emergency use of in vitro diagnostic tests for detection of SARS-CoV-2 virus and/or diagnosis of COVID-19 infection under section 564(b)(1) of the Act, 21 U.S.C. 637CHY-8(F) (1), unless the authorization is terminated or revoked sooner. When diagnostic testing is negativ e, the possibility of a false negative result should be considered in the context of a patient's recent exposures and the presence of clinical signs and symptoms consistent with COVID-19. An individual without symptoms of COVID-19 and who is not shedding SARS-CoV-2 virus would expect to have a negative (not detected) result in this assay.   SARS-COV-2, NAA 2 DAY TAT     Status: None   Collection Time: 11/13/19 12:00 AM   Saline  Is this test f  Result Value Ref Range Status   SARS-CoV-2, NAA 2 DAY TAT Performed  Final  Blood Culture (routine x 2)     Status: None (Preliminary result)   Collection Time: 11/25/2019  8:25 PM   Specimen: BLOOD  Result Value Ref Range Status   Specimen Description   Final    BLOOD RIGHT ANTECUBITAL Performed at [facility] [address] [place], [place] [address]    Special Requests   Final    BOTTLES DRAWN AEROBIC AND ANAEROBIC Blood Culture adequate volume Performed at [facility] [address] [place], [place] [address]    Culture   Setup Time   Final    ANAEROBIC BOTTLE ONLY GRAM POSITIVE COCCI Organism ID to follow CRITICAL RESULT CALLED TO, READ BACK BY AND VERIFIED WITH: Sheffield Slider [facility] 11/18/19/0330 JDW Performed at [facility], [address] [place], [place] [address]    Culture GRAM POSITIVE COCCI  Final   Report Status PENDING  Incomplete  Blood Culture ID Panel (Reflexed)     Status: Abnormal   Collection Time: 11/01/2019  8:25 PM  Result Value Ref Range Status   Enterococcus faecalis NOT DETECTED NOT DETECTED Final   Enterococcus Faecium NOT DETECTED NOT DETECTED Final   Listeria monocytogenes NOT DETECTED NOT DETECTED Final   Staphylococcus species DETECTED (A) NOT DETECTED Final    Comment: CRITICAL RESULT CALLED TO, READ BACK BY AND VERIFIED WITH: Sheffield Slider [facility] 11/18/19 0330 JDW    Staphylococcus aureus (BCID) NOT DETECTED NOT DETECTED Final   Staphylococcus epidermidis DETECTED (A) NOT DETECTED Final    Comment: Methicillin (oxacillin) resistant coagulase negative staphylococcus. Possible blood culture contaminant (unless isolated from more than one blood culture draw or clinical case suggests pathogenicity). No antibiotic treatment is indicated for blood  culture contaminants. CRITICAL RESULT CALLED TO, READ BACK BY AND VERIFIED WITH: Sheffield Slider [facility] 11/18/19 0330 JDW    Staphylococcus lugdunensis NOT DETECTED NOT DETECTED Final   Streptococcus species NOT DETECTED NOT DETECTED Final   Streptococcus agalactiae NOT DETECTED NOT DETECTED Final   Streptococcus pneumoniae NOT DETECTED NOT DETECTED Final   Streptococcus pyogenes NOT DETECTED NOT DETECTED Final   A.calcoaceticus-baumannii NOT DETECTED  NOT DETECTED Final   Bacteroides fragilis NOT DETECTED NOT DETECTED Final   Enterobacterales NOT DETECTED NOT DETECTED Final   Enterobacter cloacae complex NOT DETECTED NOT DETECTED Final   Escherichia coli NOT DETECTED NOT DETECTED Final   Klebsiella aerogenes NOT DETECTED NOT DETECTED  Final   Klebsiella oxytoca NOT DETECTED NOT DETECTED Final   Klebsiella pneumoniae NOT DETECTED NOT DETECTED Final   Proteus species NOT DETECTED NOT DETECTED Final   Salmonella species NOT DETECTED NOT DETECTED Final   Serratia marcescens NOT DETECTED NOT DETECTED Final   Haemophilus influenzae NOT DETECTED NOT DETECTED Final   Neisseria meningitidis NOT DETECTED NOT DETECTED Final   Pseudomonas aeruginosa NOT DETECTED NOT DETECTED Final   Stenotrophomonas maltophilia NOT DETECTED NOT DETECTED Final   Candida albicans NOT DETECTED NOT DETECTED Final   Candida auris NOT DETECTED NOT DETECTED Final   Candida glabrata NOT DETECTED NOT DETECTED Final   Candida krusei NOT DETECTED NOT DETECTED Final   Candida parapsilosis NOT DETECTED NOT DETECTED Final   Candida tropicalis NOT DETECTED NOT DETECTED Final   Cryptococcus neoformans/gattii NOT DETECTED NOT DETECTED Final   Methicillin resistance mecA/C DETECTED (A) NOT DETECTED Final    Comment: CRITICAL RESULT CALLED TO, READ BACK BY AND VERIFIED WITH: Sheffield Slider [facility] 11/18/19 0330 JDW Performed at [facility], [address] [place], [place] [address]       Radiology Studies: US RENAL  Result Date: 11/17/2019 CLINICAL DATA:  69 year old female with acute renal injury. EXAM: RENAL / URINARY TRACT ULTRASOUND COMPLETE COMPARISON:  Renal ultrasound 07/25/2018. FINDINGS: Right Kidney: Renal measurements: 8.7 x 5.2 x 4.7 cm = volume: 106 mL. Cortical echogenicity within normal limits. Stable cortical thickness. No right hydronephrosis or renal lesion. Left Kidney: Renal measurements: 8.0 x 7.3 x 4.4 cm = volume: 128 mL. Cortical echogenicity within normal limits. Stable cortical thickness. Benign appearing 2 cm left renal midpole cyst redemonstrated (image 14). No hydronephrosis or other left renal lesion. Bladder: Appears normal for degree of bladder distention. Other: None. IMPRESSION: 1. No acute renal findings.  Normal bladder. 2.  Stable since last year and largely unremarkable for age ultrasound appearance of both kidneys. Electronically Signed   By: Genevie Ann M.D.   On: 11/17/2019 10:30   DG Chest Port 1 View  Result Date: 11/21/2019 CLINICAL DATA:  Shortness of breath. Confusion. COVID positive 3 days ago. EXAM: PORTABLE CHEST 1 VIEW COMPARISON:  Radiograph 07/17/2019 FINDINGS: Patchy bilateral lung opacities in a mid lower lung zone predominant distribution. Heart is normal in size with normal mediastinal contours. No pneumothorax, pleural effusion, or evidence of pulmonary edema. No acute osseous abnormalities are seen. IMPRESSION: Patchy bilateral lung opacities in a pattern consistent with COVID-19 pneumonia. Electronically Signed   By: Keith Rake M.D.   On: 11/05/2019 20:04   ECHOCARDIOGRAM LIMITED  Result Date: 11/17/2019    ECHOCARDIOGRAM LIMITED REPORT   Patient Name:   Tracy Massey Date of Exam: 11/17/2019 Medical Rec #:  [id number]           Height:       63.0 in Accession #:    [id number]          Weight:       185.2 lb Date of Birth:  06/06/1950           BSA:          1.871 m Patient Age:    70 years  BP:           169/96 mmHg Patient Gender: F                   HR:           71 bpm. Exam Location:  Inpatient Procedure: Limited Echo, Cardiac Doppler, Color Doppler and Intracardiac            Opacification Agent Indications:    Elevated troponin, Abnormal EKG  History:        Patient has no prior history of Echocardiogram examinations.                 PVD, Arrythmias:Abnormal EKG, Signs/Symptoms:Shortness of                 Breath; Risk Factors:Hypertension and Dyslipidemia. COVID +.  Sonographer:    Dustin Flock Referring Phys: [phone] [facility]  Sonographer Comments: Patient is morbidly obese. Image acquisition challenging due to respiratory motion and Image acquisition challenging due to patient body habitus. IMPRESSIONS  1. Left ventricular ejection fraction, by estimation, is 60 to 65%.  The left ventricle has normal function. There is mild left ventricular hypertrophy. Left ventricular diastolic parameters are consistent with Grade I diastolic dysfunction (impaired relaxation).  2. Right ventricular systolic function is normal. The right ventricular size is normal. There is normal pulmonary artery systolic pressure. The estimated right ventricular systolic pressure is 62.2 mmHg.  3. Left atrial size was mildly dilated.  4. The mitral valve is grossly normal. Mild mitral valve regurgitation.  5. The aortic valve is tricuspid. Aortic valve regurgitation is not visualized. No aortic stenosis is present.  6. The inferior vena cava is normal in size with greater than 50% respiratory variability, suggesting right atrial pressure of 3 mmHg. FINDINGS  Left Ventricle: Left ventricular ejection fraction, by estimation, is 60 to 65%. The left ventricle has normal function. There is mild left ventricular hypertrophy. Right Ventricle: The right ventricular size is normal. Right ventricular systolic function is normal. There is normal pulmonary artery systolic pressure. The tricuspid regurgitant velocity is 2.82 m/s, and with an assumed right atrial pressure of 3 mmHg,  the estimated right ventricular systolic pressure is 63.3 mmHg. Left Atrium: Left atrial size was mildly dilated. Right Atrium: Right atrial size was normal in size. Mitral Valve: The mitral valve is grossly normal. Mild mitral annular calcification. Mild mitral valve regurgitation. Tricuspid Valve: The tricuspid valve is grossly normal. Tricuspid valve regurgitation is trivial. Aortic Valve: The aortic valve is tricuspid. Aortic valve regurgitation is not visualized. No aortic stenosis is present. Pulmonic Valve: The pulmonic valve was grossly normal. Pulmonic valve regurgitation is mild. Venous: The inferior vena cava was not well visualized. The inferior vena cava is normal in size with greater than 50% respiratory variability, suggesting  right atrial pressure of 3 mmHg. LEFT VENTRICLE PLAX 2D LVIDd:         4.20 cm  Diastology LVIDs:         3.40 cm  LV e' lateral:   8.05 cm/s LV PW:         1.10 cm  LV E/e' lateral: 6.1 LV IVS:        1.20 cm  LV e' medial:    5.33 cm/s LVOT diam:     2.10 cm  LV E/e' medial:  9.2 LVOT Area:     3.46 cm  LEFT ATRIUM         Index LA diam:  3.70 cm 1.98 cm/m   AORTA Ao Root diam: 2.70 cm MITRAL VALVE               TRICUSPID VALVE MV Area (PHT): 3.60 cm    TR Peak grad:   31.8 mmHg MV Decel Time: 211 msec    TR Vmax:        282.00 cm/s MV E velocity: 49.30 cm/s MV A velocity: 81.80 cm/s  SHUNTS MV E/A ratio:  0.60        Systemic Diam: 2.10 cm Gayatri Acharya MD Electronically signed by Gayatri Acharya MD Signature Date/Time: 11/17/2019/4:43:01 PM    Final        LOS: 2 days      [facility] Pager on [email or web address]  11/18/2019, 10:14 AM    

## 2019-11-18 NOTE — Progress Notes (Signed)
Initial Nutrition Assessment  DOCUMENTATION CODES:   Obesity unspecified  INTERVENTION:  Boost Breeze po TID, each supplement provides 250 kcal and 9 grams of protein  ProSource Plus 30 ml po TID, each supplement provides 100 kcal and 15 grams of protein  MVI with minerals daily  Advance diet as medically feasible  Monitor for need of nutrition support  High refeed risk, recommend monitoring K, Mg, and P as po intake improves.   NUTRITION DIAGNOSIS:   Inadequate oral intake related to acute illness, poor appetite, nausea (acute respiratory failure; pneumonia secondary to COVID-19) as evidenced by per patient/family report.    GOAL:   Patient will meet greater than or equal to 90% of their needs    MONITOR:   PO intake, Weight trends, Supplement acceptance, I & O's, Labs, Diet advancement  REASON FOR ASSESSMENT:   Consult Assessment of nutrition requirement/status  ASSESSMENT:  RD working remotely.  69 year old female admitted with acute respiratory failure with hypoxia and pneumonia due to COVID-19 presented with 6 day history of SOB and AMS. Past medical history significant of asthma, HLD, HTN, PVD.  Patient remains on 15 L HF plus nonrebreather, sats in late 80-90s. Respiratory status remains tenuous, possible need to change to heated high flow saturations remain low. Patient refused prone positioning, unable to lay flat on stomach.   Patient reports nausea without vomiting, diarrhea and very poor oral intake in the last several days. Ongoing nausea, no diarrhea since admission, continues to have SOB and cough with clear expectoration. Given reported poor po prior to admission and CLD x 2 day, pt has likely been without adequate nutrition >5 days and is at risk for malnutrition. If unable to advance diet in the next 24-48 hrs, consider placing NGT for nutrition support. Will order Boost Breeze and Prosource supplements to aid with meeting needs.   Current wt 181.28  lb Per chart, stable weight history over the last year  Medications reviewed and include: Vit C, Doxycycline, Methylprednisolone, Zinc sulfate IVF: Lactated ringers Remdesivir Labs: BUN 66 (H), Cr 2.75 (H) K/Mg - WNL   NUTRITION - FOCUSED PHYSICAL EXAM: Unable to complete at this time, RD working remotely.  Diet Order:   Diet Order            Diet clear liquid Room service appropriate? Yes; Fluid consistency: Thin  Diet effective now                 EDUCATION NEEDS:   No education needs have been identified at this time  Skin:  Skin Assessment: Reviewed RN Assessment  Last BM:  unknown  Height:   Ht Readings from Last 1 Encounters:  11/18/19 5\' 2"  (1.575 m)    Weight:   Wt Readings from Last 1 Encounters:  11/18/19 82.4 kg    Ideal Body Weight:  50 kg  BMI:  Body mass index is 33.23 kg/m.  Estimated Nutritional Needs:   Kcal:  1800-2000  Protein:  90-100  Fluid:  >/= 1.8 L    11/20/19, RD, LDN Clinical Nutrition After Hours/Weekend Pager # in Amion

## 2019-11-19 ENCOUNTER — Inpatient Hospital Stay (HOSPITAL_COMMUNITY): Payer: Medicare HMO

## 2019-11-19 LAB — BLOOD GAS, ARTERIAL
Acid-base deficit: 2.3 mmol/L — ABNORMAL HIGH (ref 0.0–2.0)
Bicarbonate: 18.7 mmol/L — ABNORMAL LOW (ref 20.0–28.0)
Drawn by: 11249
FIO2: 100
O2 Content: 45 L/min
O2 Saturation: 86.5 %
Patient temperature: 97.8
pCO2 arterial: 24.2 mmHg — ABNORMAL LOW (ref 32.0–48.0)
pH, Arterial: 7.498 — ABNORMAL HIGH (ref 7.350–7.450)
pO2, Arterial: 52.4 mmHg — ABNORMAL LOW (ref 83.0–108.0)

## 2019-11-19 LAB — CBC WITH DIFFERENTIAL/PLATELET
Abs Immature Granulocytes: 0.04 10*3/uL (ref 0.00–0.07)
Basophils Absolute: 0 10*3/uL (ref 0.0–0.1)
Basophils Relative: 0 %
Eosinophils Absolute: 0 10*3/uL (ref 0.0–0.5)
Eosinophils Relative: 0 %
HCT: 40.1 % (ref 36.0–46.0)
Hemoglobin: 12.6 g/dL (ref 12.0–15.0)
Immature Granulocytes: 0 %
Lymphocytes Relative: 9 %
Lymphs Abs: 1 10*3/uL (ref 0.7–4.0)
MCH: 29.4 pg (ref 26.0–34.0)
MCHC: 31.4 g/dL (ref 30.0–36.0)
MCV: 93.7 fL (ref 80.0–100.0)
Monocytes Absolute: 0.5 10*3/uL (ref 0.1–1.0)
Monocytes Relative: 4 %
Neutro Abs: 8.9 10*3/uL — ABNORMAL HIGH (ref 1.7–7.7)
Neutrophils Relative %: 87 %
Platelets: 295 10*3/uL (ref 150–400)
RBC: 4.28 MIL/uL (ref 3.87–5.11)
RDW: 13.9 % (ref 11.5–15.5)
WBC: 10.4 10*3/uL (ref 4.0–10.5)
nRBC: 0 % (ref 0.0–0.2)

## 2019-11-19 LAB — COMPREHENSIVE METABOLIC PANEL
ALT: 29 U/L (ref 0–44)
AST: 47 U/L — ABNORMAL HIGH (ref 15–41)
Albumin: 2.6 g/dL — ABNORMAL LOW (ref 3.5–5.0)
Alkaline Phosphatase: 77 U/L (ref 38–126)
Anion gap: 11 (ref 5–15)
BUN: 64 mg/dL — ABNORMAL HIGH (ref 8–23)
CO2: 18 mmol/L — ABNORMAL LOW (ref 22–32)
Calcium: 8.2 mg/dL — ABNORMAL LOW (ref 8.9–10.3)
Chloride: 113 mmol/L — ABNORMAL HIGH (ref 98–111)
Creatinine, Ser: 1.88 mg/dL — ABNORMAL HIGH (ref 0.44–1.00)
GFR calc Af Amer: 31 mL/min — ABNORMAL LOW (ref >60)
GFR calc non Af Amer: 27 mL/min — ABNORMAL LOW (ref >60)
Glucose, Bld: 150 mg/dL — ABNORMAL HIGH (ref 70–99)
Potassium: 3.8 mmol/L (ref 3.5–5.1)
Sodium: 142 mmol/L (ref 135–145)
Total Bilirubin: 0.6 mg/dL (ref 0.3–1.2)
Total Protein: 6.2 g/dL — ABNORMAL LOW (ref 6.5–8.1)

## 2019-11-19 LAB — D-DIMER, QUANTITATIVE: D-Dimer, Quant: 2.8 ug/mL-FEU — ABNORMAL HIGH (ref 0.00–0.50)

## 2019-11-19 LAB — C-REACTIVE PROTEIN: CRP: 4.1 mg/dL — ABNORMAL HIGH (ref <1.0)

## 2019-11-19 LAB — MAGNESIUM: Magnesium: 1.8 mg/dL (ref 1.7–2.4)

## 2019-11-19 MED ORDER — ONDANSETRON HCL 4 MG/2ML IJ SOLN: 4.0000 mg | Freq: Four times a day (QID) | INTRAMUSCULAR | Status: DC | PRN

## 2019-11-19 MED ORDER — AMLODIPINE BESYLATE 5 MG PO TABS
5.0000 mg | ORAL_TABLET | Freq: Every day | ORAL | Status: DC
  Administered 2019-11-19 – 2019-11-20 (×2): 5 mg via ORAL
  Filled 2019-11-19 (×2): qty 1

## 2019-11-19 MED ORDER — METOPROLOL SUCCINATE ER 25 MG PO TB24
50.0000 mg | ORAL_TABLET | Freq: Every day | ORAL | Status: DC
  Administered 2019-11-20: 50 mg via ORAL
  Filled 2019-11-19: qty 2

## 2019-11-19 MED ORDER — MORPHINE SULFATE (PF) 2 MG/ML IV SOLN
2.0000 mg | INTRAVENOUS | Status: DC | PRN
  Administered 2019-11-20 – 2019-11-23 (×8): 2 mg via INTRAVENOUS
  Filled 2019-11-19 (×9): qty 1

## 2019-11-19 MED ORDER — ORAL CARE MOUTH RINSE
15.0000 mL | Freq: Two times a day (BID) | OROMUCOSAL | Status: DC
  Administered 2019-11-19 – 2019-11-23 (×6): 15 mL via OROMUCOSAL

## 2019-11-19 MED ORDER — CHLORHEXIDINE GLUCONATE 0.12 % MT SOLN
15.0000 mL | Freq: Two times a day (BID) | OROMUCOSAL | Status: DC
  Administered 2019-11-19 – 2019-11-23 (×7): 15 mL via OROMUCOSAL
  Filled 2019-11-19 (×5): qty 15

## 2019-11-19 NOTE — Progress Notes (Signed)
PROGRESS NOTE  Tracy RIEHLE YJE:[id number] DOB: Jan 18, 1951 DOA: 11/26/2019  PCP: Hali Marry, MD  Brief History/Interval Summary: 69 y.o. female with medical history significant of asthma, HLD, HTN, PVD presented with shortness of breath and was noted to be confused as well.  She apparently was diagnosed with COVID-19 a few days ago.  Her symptoms started 6 days prior to admission.  Apparently she was on the way to the infusion center to get monoclonal antibodies but then she apparently got confused and got lost in the parking lot.  She was noted to be cyanotic and hypotensive and hypoxic.  She was promptly taken to the emergency department.  She was noted to be profoundly hypoxic and required 15 L of oxygen.  She was subsequently hospitalized for further management.  She has not yet been vaccinated against COVID-19.  Reason for Visit: Acute respiratory failure with hypoxia.  Pneumonia due to COVID-19  Consultants: None  Procedures: None  Antibiotics: Anti-infectives (From admission, onward)   Start     Dose/Rate Route Frequency Ordered Stop   11/17/19 1200  doxycycline (VIBRA-TABS) tablet 100 mg        100 mg Oral Every 12 hours 11/17/19 1117     11/17/19 1000  remdesivir 100 mg in sodium chloride 0.9 % 100 mL IVPB  Status:  Discontinued       "Followed by" Linked Group Details   100 mg 200 mL/hr over 30 Minutes Intravenous Daily 11/08/2019 2253 11/05/2019 2256   11/17/19 1000  remdesivir 100 mg in sodium chloride 0.9 % 100 mL IVPB       "Followed by" Linked Group Details   100 mg 200 mL/hr over 30 Minutes Intravenous Daily 11/21/2019 2147 11/21/19 0959   11/05/2019 2253  remdesivir 200 mg in sodium chloride 0.9% 250 mL IVPB  Status:  Discontinued       "Followed by" Linked Group Details   200 mg 580 mL/hr over 30 Minutes Intravenous Once 11/03/2019 2253 11/04/2019 2256   11/13/2019 2200  remdesivir 200 mg in sodium chloride 0.9% 250 mL IVPB       "Followed by" Linked Group  Details   200 mg 580 mL/hr over 30 Minutes Intravenous Once 11/27/2019 2147 11/19/2019 2316      Subjective/Interval History: No overnight events per nursing staff.  Blood pressure has been noted to be occasionally elevated.  Patient continues to have difficulty breathing though no worse compared to yesterday.  Denies any chest pain.  No nausea vomiting.  She admits to being a little bit anxious.      Assessment/Plan:  Acute Hypoxic Resp. Failure/Pneumonia due to COVID-19  Recent Labs  Lab 10/29/2019 1915 11/19/2019 1944 11/22/2019 2025 11/17/19 0525 11/18/19 0500 11/19/19 0206  DDIMER  --  1.76*  --  1.71* 2.42* 2.80*  FERRITIN  --   --  577* 574* 547*  --   CRP  --   --  11.8* 8.6* 6.4* 4.1*  ALT 25  --   --  _0 PROCALCITON  --  0.47  --   --   --   --     Objective findings: Fever: Remains afebrile Oxygen requirements: Heated HFNC 45 L plus nonrebreather saturating in the late 80s to early 90s.     COVID 19 Therapeutics: Antibacterials: Procalcitonin noted to be 0.47.  Allergy to penicillin and cephalosporins noted.  QT prolongation noted.  She was placed on doxycycline. Remdesivir: Day 4 Steroids: Solu-Medrol 40 mg  twice a day Diuretics: None Actemra/Baricitinib: Received Actemra on 8/19 PUD Prophylaxis: None DVT Prophylaxis:  Lovenox 30 mg daily  Patient had to be transitioned to heated high flow oxygen yesterday.  Currently on 45 L.  She is also requiring nonrebreather.  Noted to be saturating in the late 80s to early 90s.  Inflammatory markers slightly better today actually CRP is down to 4.1.  D-dimer noted to be elevated most likely due to COVID-19.  She also was noted to have elevated procalcitonin and was started on doxycycline which will be continued.  She remains on Remdesivir and steroids.  She received Actemra on 8/19.  Continue with incentive spirometry mobilization out of bed to chair as tolerated.  Prone positioning as tolerated.  The treatment plan and  use of medications and known side effects were discussed with patient/family. Some of the medications used are based on case reports/anecdotal data.  All other medications being used in the management of COVID-19 based on limited study data.  Complete risks and long-term side effects are unknown, however in the best clinical judgment they seem to be of some benefit.  Patient/family wanted to proceed with treatment options provided.  Acute kidney injury on chronic kidney disease stage IIIb/metabolic acidosis Based on review of old records it looks like her baseline creatinine is close to 1.5-1.6.  She presented with a creatinine of 4.29.   Patient had reported poor oral intake and multiple episodes of loose stool.  Her acute renal failure was thought to be due to hypovolemia.  She was aggressively hydrated.  Renal ultrasound did not show any hydronephrosis. After IV hydration she did start making urine.  UA reviewed. Renal function continues to improve.  Creatinine is down to 1.88.  Metabolic acidosis is stable.  We will stop her IV fluids.  Monitor urine output.  Avoid nephrotoxic agents.    Essential hypertension Holding ACE inhibitor due to renal failure.  Blood pressure was poorly controlled.  She was started back on her metoprolol.  Will increase the dose.  May need to add additional agents.  Hydralazine as needed.    Lactic acidosis Secondary to hypovolemia.  No evidence for sepsis.  Improved with IV hydration.    History of cough variant asthma Albuterol as needed.  History of hyperlipidemia Continue statin.  AST noted to be mildly elevated..  Mildly elevated troponin Most likely due to demand ischemia from acute illness.  EKG showed nonspecific T wave changes.  Patient denies any chest pain.  Echocardiogram shows normal systolic function.  Right ventricle was also normal.  No further cardiac work-up at this time.   Bacteremia Blood culture with staph epidermidis.  She is afebrile.  This  is most likely a contaminant.  No need to add antibiotics at this time.  QT prolongation Avoid QT prolonging medication as much as possible.  History of peripheral vascular disease Appears to be stable.  Questionable psychiatric history Patient noted to be on Abilify as well as Topamax.  Also noted to be on Cymbalta.    Obesity Estimated body mass index is 33.23 kg/m as calculated from the following:   Height as of this encounter: 5' 2" (1.575 m).   Weight as of this encounter: 82.4 kg.    DVT Prophylaxis: Lovenox Code Status: Full code Family Communication: Daughter being updated daily. Disposition Plan: Hopefully return home when improved  Status is: Inpatient  Remains inpatient appropriate because:IV treatments appropriate due to intensity of illness or inability to take PO and  Inpatient level of care appropriate due to severity of illness   Dispo:  Patient From: Home  Planned Disposition: Home  Expected discharge date: 11/21/19  Medically stable for discharge: No       Medications:  Scheduled: . (feeding supplement) PROSource Plus  30 mL Oral TID BM  . ARIPiprazole  10 mg Oral Daily  . vitamin C  500 mg Oral Daily  . atorvastatin  10 mg Oral QHS  . chlorhexidine gluconate (MEDLINE KIT)  15 mL Mouth Rinse BID  . Chlorhexidine Gluconate Cloth  6 each Topical Daily  . Chlorhexidine Gluconate Cloth  6 each Topical Q0600  . doxycycline  100 mg Oral Q12H  . DULoxetine  60 mg Oral Daily  . enoxaparin (LOVENOX) injection  30 mg Subcutaneous Q24H  . feeding supplement  1 Container Oral TID BM  . [START ON 12/15/2019] HYDROcodone-acetaminophen  1 tablet Oral Q8H  . mouth rinse  15 mL Mouth Rinse 10 times per day  . methylPREDNISolone (SOLU-MEDROL) injection  40 mg Intravenous Q12H  . metoprolol succinate  25 mg Oral Daily  . multivitamin with minerals  1 tablet Oral Q24H  . mupirocin ointment  1 application Nasal BID  . sodium chloride flush  3 mL Intravenous Q12H    . topiramate  25 mg Oral BID  . zinc sulfate  220 mg Oral Daily   Continuous: . remdesivir 100 mg in NS 100 mL 100 mg (11/19/19 0824)   YQM:VHQIONGEXBMWU, albuterol, guaiFENesin-dextromethorphan, hydrALAZINE, phenol   Objective:  Vital Signs  Vitals:   11/19/19 0753 11/19/19 0800 11/19/19 0900 11/19/19 1000  BP:  (!) 128/45 (!) 161/86 (!) 158/68  Pulse: 73 (!) 53 83 (!) 57  Resp: (!) 25 19 (!) 39 (!) 30  Temp:  97.8 F (36.6 C)    TempSrc:  Axillary    SpO2: 94% 92% 92% (!) 88%  Weight:      Height:        Intake/Output Summary (Last 24 hours) at 11/19/2019 1055 Last data filed at 11/19/2019 0500 Gross per 24 hour  Intake 3487.31 ml  Output --  Net 3487.31 ml   Filed Weights   11/19/2019 1856 11/17/19 1241 11/18/19 1352  Weight: 84 kg 78.9 kg 82.4 kg    General appearance: Awake alert.  In no distress Resp: Tachypneic.  No use of accessory muscles.  Coarse breath sounds with crackles bilateral bases.  No wheezing or rhonchi.   Cardio: S1-S2 is normal regular.  No S3-S4.  No rubs murmurs or bruit GI: Abdomen is soft.  Nontender nondistended.  Bowel sounds are present normal.  No masses organomegaly Extremities: No edema.  Full range of motion of lower extremities. Neurologic: No focal neurological deficits.     Lab Results:  Data Reviewed: I have personally reviewed following labs and imaging studies  CBC: Recent Labs  Lab 11/15/2019 1915 11/17/19 0525 11/18/19 0500 11/19/19 0206  WBC 18.1* 11.6* 10.5 10.4  NEUTROABS 16.0* 9.7* 8.6* 8.9*  HGB 14.2 12.6 12.9 12.6  HCT 46.3* 39.3 39.3 40.1  MCV 100.0 94.2 92.3 93.7  PLT 312 260 275 132    Basic Metabolic Panel: Recent Labs  Lab 11/15/2019 1915 11/17/19 0525 11/18/19 0500 11/19/19 0206  NA 137 139 142 142  K 5.0 4.2 4.1 3.8  CL 106 103 109 113*  CO2 11* 18* 20* 18*  GLUCOSE 131* 127* 130* 150*  BUN 57* 63* 66* 64*  CREATININE 4.29* 4.42* 2.75* 1.88*  CALCIUM 8.5*  8.4* 8.0* 8.2*  MG  --  1.9 1.8  1.8    GFR: Estimated Creatinine Clearance: 27.7 mL/min (A) (by C-G formula based on SCr of 1.88 mg/dL (H)).  Liver Function Tests: Recent Labs  Lab 11/26/2019 1915 11/17/19 0525 11/18/19 0500 11/19/19 0206  AST 67* 50* 56* 47*  ALT _0 ALKPHOS 94 80 81 77  BILITOT 0.8 0.6 0.7 0.6  PROT 8.2* 7.0 6.8 6.2*  ALBUMIN 3.1* 2.7* 2.6* 2.6*    CBG: Recent Labs  Lab 11/17/19 0754  GLUCAP 109*    Lipid Profile: Recent Labs    10/29/2019 1944  TRIG 193*    Anemia Panel: Recent Labs    11/17/19 0525 11/18/19 0500  FERRITIN 574* 547*    Recent Results (from the past 240 hour(s))  Novel Coronavirus, NAA ([facility])     Status: Abnormal   Collection Time: 11/13/19 12:00 AM   Specimen: Nasal Swab; Saline   Saline  Is this test f  Result Value Ref Range Status   SARS-CoV-2, NAA Detected (A) Not Detected Final    Comment: Patients who have a positive COVID-19 test result may now have treatment options. Treatment options are available for patients with mild to moderate symptoms and for hospitalized patients. Visit our website at [email or web address] for resources and information. This nucleic acid amplification test was developed and its performance characteristics determined by [employer]. Nucleic acid amplification tests include RT-PCR and TMA. This test has not been FDA cleared or approved. This test has been authorized by FDA under an Emergency Use Authorization (EUA). This test is only authorized for the duration of time the declaration that circumstances exist justifying the authorization of the emergency use of in vitro diagnostic tests for detection of SARS-CoV-2 virus and/or diagnosis of COVID-19 infection under section 564(b)(1) of the Act, 21 U.S.C. 242PNT-6(R) (1), unless the authorization is terminated or revoked sooner. When diagnostic testing is negativ e, the possibility of a false negative result should be considered in the  context of a patient's recent exposures and the presence of clinical signs and symptoms consistent with COVID-19. An individual without symptoms of COVID-19 and who is not shedding SARS-CoV-2 virus would expect to have a negative (not detected) result in this assay.   SARS-COV-2, NAA 2 DAY TAT     Status: None   Collection Time: 11/13/19 12:00 AM   Saline  Is this test f  Result Value Ref Range Status   SARS-CoV-2, NAA 2 DAY TAT Performed  Final  Blood Culture (routine x 2)     Status: None (Preliminary result)   Collection Time: 11/10/2019  8:25 PM   Specimen: BLOOD  Result Value Ref Range Status   Specimen Description   Final    BLOOD RIGHT ANTECUBITAL Performed at [facility] [address] [place], [place] [address]    Special Requests   Final    BOTTLES DRAWN AEROBIC AND ANAEROBIC Blood Culture adequate volume Performed at [facility] [address] [place], [place] [address]    Culture  Setup Time   Final    ANAEROBIC BOTTLE ONLY GRAM POSITIVE COCCI Organism ID to follow CRITICAL RESULT CALLED TO, READ BACK BY AND VERIFIED WITH: Sheffield Slider [facility] 11/18/19/0330 JDW Performed at [facility], [address], [place], [place] [address]    Culture GRAM POSITIVE COCCI  Final   Report Status PENDING  Incomplete  Blood Culture (routine x 2)  Status: None (Preliminary result)   Collection Time: 11/07/2019  8:25 PM   Specimen: BLOOD  Result Value Ref Range Status   Specimen Description   Final    BLOOD RIGHT ANTECUBITAL Performed at [facility] [address], [place], [place] [address]    Special Requests   Final    BOTTLES DRAWN AEROBIC AND ANAEROBIC Blood Culture adequate volume Performed at [facility] [address], [place], [place] [address]    Culture   Final    NO GROWTH 2 DAYS Performed at [facility] [address] [place], [place] [address]    Report Status  PENDING  Incomplete  Blood Culture ID Panel (Reflexed)     Status: Abnormal   Collection Time: 11/04/2019  8:25 PM  Result Value Ref Range Status   Enterococcus faecalis NOT DETECTED NOT DETECTED Final   Enterococcus Faecium NOT DETECTED NOT DETECTED Final   Listeria monocytogenes NOT DETECTED NOT DETECTED Final   Staphylococcus species DETECTED (A) NOT DETECTED Final    Comment: CRITICAL RESULT CALLED TO, READ BACK BY AND VERIFIED WITH: Sheffield Slider [facility] 11/18/19 0330 JDW    Staphylococcus aureus (BCID) NOT DETECTED NOT DETECTED Final   Staphylococcus epidermidis DETECTED (A) NOT DETECTED Final    Comment: Methicillin (oxacillin) resistant coagulase negative staphylococcus. Possible blood culture contaminant (unless isolated from more than one blood culture draw or clinical case suggests pathogenicity). No antibiotic treatment is indicated for blood  culture contaminants. CRITICAL RESULT CALLED TO, READ BACK BY AND VERIFIED WITH: Sheffield Slider [facility] 11/18/19 0330 JDW    Staphylococcus lugdunensis NOT DETECTED NOT DETECTED Final   Streptococcus species NOT DETECTED NOT DETECTED Final   Streptococcus agalactiae NOT DETECTED NOT DETECTED Final   Streptococcus pneumoniae NOT DETECTED NOT DETECTED Final   Streptococcus pyogenes NOT DETECTED NOT DETECTED Final   A.calcoaceticus-baumannii NOT DETECTED NOT DETECTED Final   Bacteroides fragilis NOT DETECTED NOT DETECTED Final   Enterobacterales NOT DETECTED NOT DETECTED Final   Enterobacter cloacae complex NOT DETECTED NOT DETECTED Final   Escherichia coli NOT DETECTED NOT DETECTED Final   Klebsiella aerogenes NOT DETECTED NOT DETECTED Final   Klebsiella oxytoca NOT DETECTED NOT DETECTED Final   Klebsiella pneumoniae NOT DETECTED NOT DETECTED Final   Proteus species NOT DETECTED NOT DETECTED Final   Salmonella species NOT DETECTED NOT DETECTED Final   Serratia marcescens NOT DETECTED NOT DETECTED Final   Haemophilus influenzae NOT DETECTED NOT  DETECTED Final   Neisseria meningitidis NOT DETECTED NOT DETECTED Final   Pseudomonas aeruginosa NOT DETECTED NOT DETECTED Final   Stenotrophomonas maltophilia NOT DETECTED NOT DETECTED Final   Candida albicans NOT DETECTED NOT DETECTED Final   Candida auris NOT DETECTED NOT DETECTED Final   Candida glabrata NOT DETECTED NOT DETECTED Final   Candida krusei NOT DETECTED NOT DETECTED Final   Candida parapsilosis NOT DETECTED NOT DETECTED Final   Candida tropicalis NOT DETECTED NOT DETECTED Final   Cryptococcus neoformans/gattii NOT DETECTED NOT DETECTED Final   Methicillin resistance mecA/C DETECTED (A) NOT DETECTED Final    Comment: CRITICAL RESULT CALLED TO, READ BACK BY AND VERIFIED WITH: Sheffield Slider [facility] 11/18/19 0330 JDW Performed at [facility], [address] [place], [place] [address]   MRSA PCR Screening     Status: Abnormal   Collection Time: 11/18/19  1:52 PM   Specimen: Nasal Mucosa; Nasopharyngeal  Result Value Ref Range Status   MRSA by PCR POSITIVE (A) NEGATIVE Final  Comment:        The GeneXpert MRSA Assay (FDA approved for NASAL specimens only), is one component of a comprehensive MRSA colonization surveillance program. It is not intended to diagnose MRSA infection nor to guide or monitor treatment for MRSA infections. RESULT CALLED TO, READ BACK BY AND VERIFIED WITH: E.HEAVNER,RN [id number] _0  BY V.WILKINS Performed at [facility] [address] [place], [place] [address]       Radiology Studies: [facility] Chest [place] 1 View  Result Date: 11/19/2019 CLINICAL DATA:  69 year old female with history of pneumonia from COVID infection. EXAM: PORTABLE CHEST 1 VIEW COMPARISON:  Chest x-ray 11/10/2019. FINDINGS: Patchy areas of interstitial prominence an ill-defined airspace disease noted throughout the mid to lower lungs bilaterally, overall with slightly worsened aeration compared to the prior examination. No pleural effusions. No evidence  of pulmonary edema. Heart size is normal. Upper mediastinal contours are within normal limits. Aortic atherosclerosis. IMPRESSION: 1. Slight worsening of multilobar bilateral pneumonia compatible with reported COVID infection, as above. 2. Aortic atherosclerosis. Electronically Signed   By: Vinnie Langton M.D.   On: 11/19/2019 06:04   ECHOCARDIOGRAM LIMITED  Result Date: 11/17/2019    ECHOCARDIOGRAM LIMITED REPORT   Patient Name:   Tracy Massey Date of Exam: 11/17/2019 Medical Rec #:  [id number]           Height:       63.0 in Accession #:    [id number]          Weight:       185.2 lb Date of Birth:  02/11/1951           BSA:          1.871 m Patient Age:    56 years            BP:           169/96 mmHg Patient Gender: F                   HR:           71 bpm. Exam Location:  Inpatient Procedure: Limited Echo, Cardiac Doppler, Color Doppler and Intracardiac            Opacification Agent Indications:    Elevated troponin, Abnormal EKG  History:        Patient has no prior history of Echocardiogram examinations.                 PVD, Arrythmias:Abnormal EKG, Signs/Symptoms:Shortness of                 Breath; Risk Factors:Hypertension and Dyslipidemia. COVID +.  Sonographer:    Dustin Flock Referring Phys: [phone] [facility]  Sonographer Comments: Patient is morbidly obese. Image acquisition challenging due to respiratory motion and Image acquisition challenging due to patient body habitus. IMPRESSIONS  1. Left ventricular ejection fraction, by estimation, is 60 to 65%. The left ventricle has normal function. There is mild left ventricular hypertrophy. Left ventricular diastolic parameters are consistent with Grade I diastolic dysfunction (impaired relaxation).  2. Right ventricular systolic function is normal. The right ventricular size is normal. There is normal pulmonary artery systolic pressure. The estimated right ventricular systolic pressure is 02.6 mmHg.  3. Left atrial size was mildly  dilated.  4. The mitral valve is grossly normal. Mild mitral valve regurgitation.  5. The aortic valve is tricuspid. Aortic valve regurgitation is not visualized. No aortic stenosis is present.  6. The  inferior vena cava is normal in size with greater than 50% respiratory variability, suggesting right atrial pressure of 3 mmHg. FINDINGS  Left Ventricle: Left ventricular ejection fraction, by estimation, is 60 to 65%. The left ventricle has normal function. There is mild left ventricular hypertrophy. Right Ventricle: The right ventricular size is normal. Right ventricular systolic function is normal. There is normal pulmonary artery systolic pressure. The tricuspid regurgitant velocity is 2.82 m/s, and with an assumed right atrial pressure of 3 mmHg,  the estimated right ventricular systolic pressure is 40.9 mmHg. Left Atrium: Left atrial size was mildly dilated. Right Atrium: Right atrial size was normal in size. Mitral Valve: The mitral valve is grossly normal. Mild mitral annular calcification. Mild mitral valve regurgitation. Tricuspid Valve: The tricuspid valve is grossly normal. Tricuspid valve regurgitation is trivial. Aortic Valve: The aortic valve is tricuspid. Aortic valve regurgitation is not visualized. No aortic stenosis is present. Pulmonic Valve: The pulmonic valve was grossly normal. Pulmonic valve regurgitation is mild. Venous: The inferior vena cava was not well visualized. The inferior vena cava is normal in size with greater than 50% respiratory variability, suggesting right atrial pressure of 3 mmHg. LEFT VENTRICLE PLAX 2D LVIDd:         4.20 cm  Diastology LVIDs:         3.40 cm  LV e' lateral:   8.05 cm/s LV PW:         1.10 cm  LV E/e' lateral: 6.1 LV IVS:        1.20 cm  LV e' medial:    5.33 cm/s LVOT diam:     2.10 cm  LV E/e' medial:  9.2 LVOT Area:     3.46 cm  LEFT ATRIUM         Index LA diam:    3.70 cm 1.98 cm/m   AORTA Ao Root diam: 2.70 cm MITRAL VALVE               TRICUSPID  VALVE MV Area (PHT): 3.60 cm    TR Peak grad:   31.8 mmHg MV Decel Time: 211 msec    TR Vmax:        282.00 cm/s MV E velocity: 49.30 cm/s MV A velocity: 81.80 cm/s  SHUNTS MV E/A ratio:  0.60        Systemic Diam: 2.10 cm Cherlynn Kaiser MD Electronically signed by Cherlynn Kaiser MD Signature Date/Time: 11/17/2019/4:43:01 PM    Final        LOS: 3 days   [facility] Pager on [email or web address]  11/19/2019, 10:55 AM

## 2019-11-20 LAB — GLUCOSE, CAPILLARY: Glucose-Capillary: 145 mg/dL — ABNORMAL HIGH (ref 70–99)

## 2019-11-20 LAB — COMPREHENSIVE METABOLIC PANEL
ALT: 33 U/L (ref 0–44)
AST: 46 U/L — ABNORMAL HIGH (ref 15–41)
Albumin: 2.7 g/dL — ABNORMAL LOW (ref 3.5–5.0)
Alkaline Phosphatase: 82 U/L (ref 38–126)
Anion gap: 13 (ref 5–15)
BUN: 61 mg/dL — ABNORMAL HIGH (ref 8–23)
CO2: 17 mmol/L — ABNORMAL LOW (ref 22–32)
Calcium: 8.4 mg/dL — ABNORMAL LOW (ref 8.9–10.3)
Chloride: 113 mmol/L — ABNORMAL HIGH (ref 98–111)
Creatinine, Ser: 1.66 mg/dL — ABNORMAL HIGH (ref 0.44–1.00)
GFR calc Af Amer: 36 mL/min — ABNORMAL LOW (ref >60)
GFR calc non Af Amer: 31 mL/min — ABNORMAL LOW (ref >60)
Glucose, Bld: 146 mg/dL — ABNORMAL HIGH (ref 70–99)
Potassium: 3.8 mmol/L (ref 3.5–5.1)
Sodium: 143 mmol/L (ref 135–145)
Total Bilirubin: 1 mg/dL (ref 0.3–1.2)
Total Protein: 6.3 g/dL — ABNORMAL LOW (ref 6.5–8.1)

## 2019-11-20 LAB — CBC WITH DIFFERENTIAL/PLATELET
Abs Immature Granulocytes: 0.07 10*3/uL (ref 0.00–0.07)
Basophils Absolute: 0 10*3/uL (ref 0.0–0.1)
Basophils Relative: 0 %
Eosinophils Absolute: 0 10*3/uL (ref 0.0–0.5)
Eosinophils Relative: 0 %
HCT: 40.8 % (ref 36.0–46.0)
Hemoglobin: 12.9 g/dL (ref 12.0–15.0)
Immature Granulocytes: 1 %
Lymphocytes Relative: 6 %
Lymphs Abs: 0.8 10*3/uL (ref 0.7–4.0)
MCH: 29.8 pg (ref 26.0–34.0)
MCHC: 31.6 g/dL (ref 30.0–36.0)
MCV: 94.2 fL (ref 80.0–100.0)
Monocytes Absolute: 0.5 10*3/uL (ref 0.1–1.0)
Monocytes Relative: 3 %
Neutro Abs: 12.9 10*3/uL — ABNORMAL HIGH (ref 1.7–7.7)
Neutrophils Relative %: 90 %
Platelets: 313 10*3/uL (ref 150–400)
RBC: 4.33 MIL/uL (ref 3.87–5.11)
RDW: 13.9 % (ref 11.5–15.5)
WBC: 14.3 10*3/uL — ABNORMAL HIGH (ref 4.0–10.5)
nRBC: 0.1 % (ref 0.0–0.2)

## 2019-11-20 LAB — BLOOD GAS, ARTERIAL
Acid-base deficit: 0.8 mmol/L (ref 0.0–2.0)
Bicarbonate: 21.4 mmol/L (ref 20.0–28.0)
FIO2: 100
O2 Saturation: 91 %
Patient temperature: 98
pCO2 arterial: 29.1 mmHg — ABNORMAL LOW (ref 32.0–48.0)
pH, Arterial: 7.477 — ABNORMAL HIGH (ref 7.350–7.450)
pO2, Arterial: 60.7 mmHg — ABNORMAL LOW (ref 83.0–108.0)

## 2019-11-20 LAB — MAGNESIUM: Magnesium: 1.9 mg/dL (ref 1.7–2.4)

## 2019-11-20 LAB — CULTURE, BLOOD (ROUTINE X 2): Special Requests: ADEQUATE

## 2019-11-20 LAB — C-REACTIVE PROTEIN: CRP: 3 mg/dL — ABNORMAL HIGH (ref <1.0)

## 2019-11-20 LAB — D-DIMER, QUANTITATIVE: D-Dimer, Quant: 3.77 ug/mL-FEU — ABNORMAL HIGH (ref 0.00–0.50)

## 2019-11-20 MED ORDER — HALOPERIDOL LACTATE 5 MG/ML IJ SOLN
2.0000 mg | Freq: Four times a day (QID) | INTRAMUSCULAR | Status: DC | PRN
  Administered 2019-11-20 – 2019-11-22 (×3): 2 mg via INTRAVENOUS
  Filled 2019-11-20 (×3): qty 1

## 2019-11-20 MED ORDER — ENOXAPARIN SODIUM 40 MG/0.4ML ~~LOC~~ SOLN
40.0000 mg | SUBCUTANEOUS | Status: DC
  Administered 2019-11-20 – 2019-11-22 (×3): 40 mg via SUBCUTANEOUS
  Filled 2019-11-20 (×2): qty 0.4

## 2019-11-20 NOTE — Progress Notes (Signed)
Pt was encouraged to prone. Conversation was had about the benefits of proning. Pt stated that should could not "lay on her stomach because it makes her sick." pt was encouraged to lay as close to on her stomach as possible. Pt stated that she would try.

## 2019-11-20 NOTE — Progress Notes (Signed)
Patient continues to remove NRB and HHFNC. Patient educated on the importance of keeping oxygen on. Given 2mg  Haldol. Will continue to monitor,

## 2019-11-20 NOTE — Progress Notes (Signed)
PROGRESS NOTE  Tracy Massey:[id number] DOB: 01/24/1951 DOA: 12-03-2019  PCP: Agapito Games, MD  Brief History/Interval Summary: 69 y.o. female with medical history significant of asthma, HLD, HTN, PVD presented with shortness of breath and was noted to be confused as well.  She apparently was diagnosed with COVID-19 a few days ago.  Her symptoms started 6 days prior to admission.  Apparently she was on the way to the infusion center to get monoclonal antibodies but then she apparently got confused and got lost in the parking lot.  She was noted to be cyanotic and hypotensive and hypoxic.  She was promptly taken to the emergency department.  She was noted to be profoundly hypoxic and required 15 L of oxygen.  She was subsequently hospitalized for further management.  She has not yet been vaccinated against COVID-19.  Reason for Visit: Acute respiratory failure with hypoxia.  Pneumonia due to COVID-19  Consultants: None  Procedures: None  Antibiotics: Anti-infectives (From admission, onward)   Start     Dose/Rate Route Frequency Ordered Stop   11/17/19 1200  doxycycline (VIBRA-TABS) tablet 100 mg        100 mg Oral Every 12 hours 11/17/19 1117     11/17/19 1000  remdesivir 100 mg in sodium chloride 0.9 % 100 mL IVPB  Status:  Discontinued       "Followed by" Linked Group Details   100 mg 200 mL/hr over 30 Minutes Intravenous Daily Dec 03, 2019 2253 03-Dec-2019 2256   11/17/19 1000  remdesivir 100 mg in sodium chloride 0.9 % 100 mL IVPB       "Followed by" Linked Group Details   100 mg 200 mL/hr over 30 Minutes Intravenous Daily 2019/12/03 2147 11/20/19 1108   12-03-2019 2253  remdesivir 200 mg in sodium chloride 0.9% 250 mL IVPB  Status:  Discontinued       "Followed by" Linked Group Details   200 mg 580 mL/hr over 30 Minutes Intravenous Once 12-03-2019 2253 12/03/2019 2256   2019/12/03 2200  remdesivir 200 mg in sodium chloride 0.9% 250 mL IVPB       "Followed by" Linked Group  Details   200 mg 580 mL/hr over 30 Minutes Intravenous Once Dec 03, 2019 2147 03-Dec-2019 2316      Subjective/Interval History: Overnight events noted.  Patient was pulling off her oxygen.  Noted to be distracted this morning.  Denies any chest pain.      Assessment/Plan:  Acute Hypoxic Resp. Failure/Pneumonia due to COVID-19  Recent Labs  Lab 12-03-19 1915 12/03/2019 1944 12-03-19 2025 11/17/19 0525 11/18/19 0500 11/19/19 0206 11/20/19 0146  DDIMER  --  1.76*  --  1.71* 2.42* 2.80* 3.77*  FERRITIN  --   --  577* 574* 547*  --   --   CRP  --   --  11.8* 8.6* 6.4* 4.1* 3.0*  ALT 25  --   --  24 28 29  33  PROCALCITON  --  0.47  --   --   --   --   --     Objective findings: Fever: Remains afebrile Oxygen requirements: Heated HFNC 50L plus nonrebreather.  Saturating in the late 80s.    COVID 19 Therapeutics: Antibacterials: Procalcitonin noted to be 0.47.  Allergy to penicillin and cephalosporins noted.  QT prolongation noted.  She was placed on doxycycline. Remdesivir: Day 5 Steroids: Solu-Medrol 40 mg twice a day Diuretics: None Actemra/Baricitinib: Received Actemra on 8/19 PUD Prophylaxis: None DVT Prophylaxis:  Lovenox 30  mg daily  Patient's respiratory status remains very tenuous.  She is saturating in the late 80s on both heated high flow plus nonrebreather.  Chest x-ray done yesterday showed worsening pneumonia.  She remains on remdesivir and steroids.  She also received Actemra on 8/19.  She also is on antibacterials due to elevated procalcitonin.  Leukocytosis is due to steroids.  Patient remains at high risk for decompensation and requiring intubation and mechanical ventilation.  Continue to encourage incentive spirometry, mobilization.  Prone positioning.  Her encephalopathy may complicate issues.  The treatment plan and use of medications and known side effects were discussed with patient/family. Some of the medications used are based on case reports/anecdotal data.   All other medications being used in the management of COVID-19 based on limited study data.  Complete risks and long-term side effects are unknown, however in the best clinical judgment they seem to be of some benefit.  Patient/family wanted to proceed with treatment options provided.  Acute metabolic encephalopathy secondary to COVID-19 Patient noted to be distracted today.  No focal deficits noted.  Appears to be mildly confused which is most likely due to acute illness.  Use Haldol as needed.  Initial EKG done at the time of admission did show prolonged QT.  However subsequent EKG has shown normal QT interval.  Acute kidney injury on chronic kidney disease stage IIIb/metabolic acidosis Based on review of old records it looks like her baseline creatinine is close to 1.5-1.6.  She presented with a creatinine of 4.29.   Patient had reported poor oral intake and multiple episodes of loose stool.  Her acute renal failure was thought to be due to hypovolemia.   Patient was aggressively hydrated.  Renal ultrasound did not show any hydronephrosis.  Renal function started improving.  Creatinine seems to be close to her baseline now.  IV fluids were discontinued.  Metabolic acidosis is stable.  Monitor urine output.  Essential hypertension Holding ACE inhibitor due to renal failure.  Blood pressure was poorly controlled.  Dose of metoprolol was increased.  Hydralazine as needed was utilized.  Amlodipine was added to her regimen.  Blood pressure seems to be better controlled.  Continue to monitor.    Lactic acidosis Secondary to hypovolemia.  No evidence for sepsis.  Improved with IV hydration.    History of cough variant asthma Albuterol as needed.  History of hyperlipidemia Continue statin.  AST noted to be mildly elevated..  Mildly elevated troponin Most likely due to demand ischemia from acute illness.  EKG showed nonspecific T wave changes.  Patient denies any chest pain.  Echocardiogram shows  normal systolic function.  Right ventricle was also normal.  No further cardiac work-up at this time.   Bacteremia Blood culture with staph epidermidis.  She is afebrile.  This is most likely a contaminant.  No need to add antibiotics at this time.  QT prolongation Avoid QT prolonging medication as much as possible.  Subsequent EKG showed normal QT interval.  History of peripheral vascular disease Appears to be stable.  Questionable psychiatric history Patient noted to be on Abilify as well as Topamax.  Also noted to be on Cymbalta.    Obesity Estimated body mass index is 33.23 kg/m as calculated from the following:   Height as of this encounter:  (1.575 m).   Weight as of this encounter: 82.4 kg.    DVT Prophylaxis: Lovenox Code Status: Full code Family Communication: Daughter being updated daily. Disposition Plan: Hopefully return home  when improved  Status is: Inpatient  Remains inpatient appropriate because:IV treatments appropriate due to intensity of illness or inability to take PO and Inpatient level of care appropriate due to severity of illness   Dispo:  Patient From: Home  Planned Disposition: To be determined  Expected discharge date: 11/24/19  Medically stable for discharge: No       Medications:  Scheduled: . (feeding supplement) PROSource Plus  30 mL Oral TID BM  . amLODipine  5 mg Oral Daily  . ARIPiprazole  10 mg Oral Daily  . vitamin C  500 mg Oral Daily  . atorvastatin  10 mg Oral QHS  . chlorhexidine  15 mL Mouth Rinse BID  . Chlorhexidine Gluconate Cloth  6 each Topical Daily  . Chlorhexidine Gluconate Cloth  6 each Topical Q0600  . doxycycline  100 mg Oral Q12H  . DULoxetine  60 mg Oral Daily  . enoxaparin (LOVENOX) injection  40 mg Subcutaneous Q24H  . feeding supplement  1 Container Oral TID BM  . [START ON Dec 22, 2019] HYDROcodone-acetaminophen  1 tablet Oral Q8H  . mouth rinse  15 mL Mouth Rinse q12n4p  . methylPREDNISolone  (SOLU-MEDROL) injection  40 mg Intravenous Q12H  . metoprolol succinate  50 mg Oral Daily  . multivitamin with minerals  1 tablet Oral Q24H  . mupirocin ointment  1 application Nasal BID  . sodium chloride flush  3 mL Intravenous Q12H  . topiramate  25 mg Oral BID  . zinc sulfate  220 mg Oral Daily   Continuous:  VEH:MCNOBSJGGEZMO, albuterol, guaiFENesin-dextromethorphan, haloperidol lactate, hydrALAZINE, morphine injection, ondansetron (ZOFRAN) IV, phenol   Objective:  Vital Signs  Vitals:   11/20/19 0400 11/20/19 0500 11/20/19 0600 11/20/19 0739  BP: (!) 129/41 (!) 165/150 (!) 157/61   Pulse: (!) 52 74 75 75  Resp: (!) 21 (!) 30 (!) 23 20  Temp: (!) 96.9 F (36.1 C)     TempSrc: Axillary     SpO2: 96% 90% (!) 83% (!) 89%  Weight:      Height:        Intake/Output Summary (Last 24 hours) at 11/20/2019 1120 Last data filed at 11/20/2019 0600 Gross per 24 hour  Intake 720 ml  Output 1200 ml  Net -480 ml   Filed Weights   11/12/2019 1856 11/17/19 1241 11/18/19 1352  Weight: 84 kg 78.9 kg 82.4 kg   General appearance: Awake alert.  Distracted.  In no distress. Resp: Noted to be tachypneic.  Some use of accessory muscles noted.  Crackles bilateral bases.  No wheezing or rhonchi. Cardio: S1-S2 is normal regular.  No S3-S4.  No rubs murmurs or bruit GI: Abdomen is soft.  Nontender nondistended.  Bowel sounds are present normal.  No masses organomegaly Extremities: No edema.  Full range of motion of lower extremities. Neurologic:   No focal neurological deficits.      Lab Results:  Data Reviewed: I have personally reviewed following labs and imaging studies  CBC: Recent Labs  Lab 11/02/2019 1915 11/17/19 0525 11/18/19 0500 11/19/19 0206 11/20/19 0146  WBC 18.1* 11.6* 10.5 10.4 14.3*  NEUTROABS 16.0* 9.7* 8.6* 8.9* 12.9*  HGB 14.2 12.6 12.9 12.6 12.9  HCT 46.3* 39.3 39.3 40.1 40.8  MCV 100.0 94.2 92.3 93.7 94.2  PLT 312 260 275 295 313    Basic Metabolic  Panel: Recent Labs  Lab 11/18/2019 1915 11/17/19 0525 11/18/19 0500 11/19/19 0206 11/20/19 0146  NA 137 139 142 142 143  K  5.0 4.2 4.1 3.8 3.8  CL 106 103 109 113* 113*  CO2 11* 18* 20* 18* 17*  GLUCOSE 131* 127* 130* 150* 146*  BUN 57* 63* 66* 64* 61*  CREATININE 4.29* 4.42* 2.75* 1.88* 1.66*  CALCIUM 8.5* 8.4* 8.0* 8.2* 8.4*  MG  --  1.9 1.8 1.8 1.9    GFR: Estimated Creatinine Clearance: 31.4 mL/min (A) (by C-G formula based on SCr of 1.66 mg/dL (H)).  Liver Function Tests: Recent Labs  Lab 11/25/2019 1915 11/17/19 0525 11/18/19 0500 11/19/19 0206 11/20/19 0146  AST 67* 50* 56* 47* 46*  ALT 33  ALKPHOS 94 80 81 77 82  BILITOT 0.8 0.6 0.7 0.6 1.0  PROT 8.2* 7.0 6.8 6.2* 6.3*  ALBUMIN 3.1* 2.7* 2.6* 2.6* 2.7*    CBG: Recent Labs  Lab 11/17/19 0754 11/20/19 0025  GLUCAP 109* 145*    Lipid Profile: No results for input(s): CHOL, HDL, LDLCALC, TRIG, CHOLHDL, LDLDIRECT in the last 72 hours.  Anemia Panel: Recent Labs    11/18/19 0500  FERRITIN 547*    Recent Results (from the past 240 hour(s))  Novel Coronavirus, NAA ([facility])     Status: Abnormal   Collection Time: 11/13/19 12:00 AM   Specimen: Nasal Swab; Saline   Saline  Is this test f  Result Value Ref Range Status   SARS-CoV-2, NAA Detected (A) Not Detected Final    Comment: Patients who have a positive COVID-19 test result may now have treatment options. Treatment options are available for patients with mild to moderate symptoms and for hospitalized patients. Visit our website at [email or web address] for resources and information. This nucleic acid amplification test was developed and its performance characteristics determined by [employer]. Nucleic acid amplification tests include RT-PCR and TMA. This test has not been FDA cleared or approved. This test has been authorized by FDA under an Emergency Use Authorization (EUA). This test is only authorized for the  duration of time the declaration that circumstances exist justifying the authorization of the emergency use of in vitro diagnostic tests for detection of SARS-CoV-2 virus and/or diagnosis of COVID-19 infection under section 564(b)(1) of the Act, 21 U.S.C. 409WJX-9(J) (1), unless the authorization is terminated or revoked sooner. When diagnostic testing is negativ e, the possibility of a false negative result should be considered in the context of a patient's recent exposures and the presence of clinical signs and symptoms consistent with COVID-19. An individual without symptoms of COVID-19 and who is not shedding SARS-CoV-2 virus would expect to have a negative (not detected) result in this assay.   SARS-COV-2, NAA 2 DAY TAT     Status: None   Collection Time: 11/13/19 12:00 AM   Saline  Is this test f  Result Value Ref Range Status   SARS-CoV-2, NAA 2 DAY TAT Performed  Final  Blood Culture (routine x 2)     Status: Abnormal   Collection Time: 11/07/2019  8:25 PM   Specimen: BLOOD  Result Value Ref Range Status   Specimen Description   Final    BLOOD RIGHT ANTECUBITAL Performed at [facility], [address], [place], [place] [address]    Special Requests   Final    BOTTLES DRAWN AEROBIC AND ANAEROBIC Blood Culture adequate volume Performed at [facility], [address], [place], [place] [address]    Culture  Setup Time   Final    ANAEROBIC BOTTLE ONLY GRAM POSITIVE COCCI Organism ID to follow  CRITICAL RESULT CALLED TO, READ BACK BY AND VERIFIED WITH: Damaris Hippo [facility] 11/18/19/0330 JDW    Culture (A)  Final    STAPHYLOCOCCUS EPIDERMIDIS THE SIGNIFICANCE OF ISOLATING THIS ORGANISM FROM A SINGLE SET OF BLOOD CULTURES WHEN MULTIPLE SETS ARE DRAWN IS UNCERTAIN. PLEASE NOTIFY THE MICROBIOLOGY DEPARTMENT WITHIN ONE WEEK IF SPECIATION AND SENSITIVITIES ARE REQUIRED. Performed at [facility], [address], [place], [place] [address]     Report Status 11/20/2019 FINAL  Final  Blood Culture (routine x 2)     Status: None (Preliminary result)   Collection Time: 11/13/2019  8:25 PM   Specimen: BLOOD  Result Value Ref Range Status   Specimen Description   Final    BLOOD RIGHT ANTECUBITAL Performed at [facility], [address], [place], [place] [address]    Special Requests   Final    BOTTLES DRAWN AEROBIC AND ANAEROBIC Blood Culture adequate volume Performed at [facility], [address] [place], [place] [address]    Culture   Final    NO GROWTH 3 DAYS Performed at [facility], [address] [place], [place] [address]    Report Status PENDING  Incomplete  Blood Culture ID Panel (Reflexed)     Status: Abnormal   Collection Time: 11/04/2019  8:25 PM  Result Value Ref Range Status   Enterococcus faecalis NOT DETECTED NOT DETECTED Final   Enterococcus Faecium NOT DETECTED NOT DETECTED Final   Listeria monocytogenes NOT DETECTED NOT DETECTED Final   Staphylococcus species DETECTED (A) NOT DETECTED Final    Comment: CRITICAL RESULT CALLED TO, READ BACK BY AND VERIFIED WITH: Damaris Hippo [facility] 11/18/19 0330 JDW    Staphylococcus aureus (BCID) NOT DETECTED NOT DETECTED Final   Staphylococcus epidermidis DETECTED (A) NOT DETECTED Final    Comment: Methicillin (oxacillin) resistant coagulase negative staphylococcus. Possible blood culture contaminant (unless isolated from more than one blood culture draw or clinical case suggests pathogenicity). No antibiotic treatment is indicated for blood  culture contaminants. CRITICAL RESULT CALLED TO, READ BACK BY AND VERIFIED WITH: Damaris Hippo [facility] 11/18/19 0330 JDW    Staphylococcus lugdunensis NOT DETECTED NOT DETECTED Final   Streptococcus species NOT DETECTED NOT DETECTED Final   Streptococcus agalactiae NOT DETECTED NOT DETECTED Final   Streptococcus pneumoniae NOT DETECTED NOT DETECTED Final   Streptococcus pyogenes NOT DETECTED  NOT DETECTED Final   A.calcoaceticus-baumannii NOT DETECTED NOT DETECTED Final   Bacteroides fragilis NOT DETECTED NOT DETECTED Final   Enterobacterales NOT DETECTED NOT DETECTED Final   Enterobacter cloacae complex NOT DETECTED NOT DETECTED Final   Escherichia coli NOT DETECTED NOT DETECTED Final   Klebsiella aerogenes NOT DETECTED NOT DETECTED Final   Klebsiella oxytoca NOT DETECTED NOT DETECTED Final   Klebsiella pneumoniae NOT DETECTED NOT DETECTED Final   Proteus species NOT DETECTED NOT DETECTED Final   Salmonella species NOT DETECTED NOT DETECTED Final   Serratia marcescens NOT DETECTED NOT DETECTED Final   Haemophilus influenzae NOT DETECTED NOT DETECTED Final   Neisseria meningitidis NOT DETECTED NOT DETECTED Final   Pseudomonas aeruginosa NOT DETECTED NOT DETECTED Final   Stenotrophomonas maltophilia NOT DETECTED NOT DETECTED Final   Candida albicans NOT DETECTED NOT DETECTED Final   Candida auris NOT DETECTED NOT DETECTED Final   Candida glabrata NOT DETECTED NOT DETECTED Final   Candida krusei NOT DETECTED NOT DETECTED Final   Candida parapsilosis NOT DETECTED NOT DETECTED Final   Candida tropicalis NOT DETECTED NOT DETECTED  Final   Cryptococcus neoformans/gattii NOT DETECTED NOT DETECTED Final   Methicillin resistance mecA/C DETECTED (A) NOT DETECTED Final    Comment: CRITICAL RESULT CALLED TO, READ BACK BY AND VERIFIED WITH: Damaris HippoM [facility] 11/18/19 0330 JDW Performed at [facility], [address] [place], [place] [address]   MRSA PCR Screening     Status: Abnormal   Collection Time: 11/18/19  1:52 PM   Specimen: Nasal Mucosa; Nasopharyngeal  Result Value Ref Range Status   MRSA by PCR POSITIVE (A) NEGATIVE Final    Comment:        The GeneXpert MRSA Assay (FDA approved for NASAL specimens only), is one component of a comprehensive MRSA colonization surveillance program. It is not intended to diagnose MRSA infection nor to guide or monitor treatment  for MRSA infections. RESULT CALLED TO, READ BACK BY AND VERIFIED WITH: E.HEAVNER,RN [id number] @1623  BY V.WILKINS Performed at [facility], [address] [place], [place] [address]       [facility]  Result Date: 11/19/2019 CLINICAL DATA:  69 year old female with history of pneumonia from COVID infection. EXAM: PORTABLE CHEST 1 VIEW COMPARISON:  Chest x-ray 11/28/2019. FINDINGS: Patchy areas of interstitial prominence an ill-defined airspace disease noted throughout the mid to lower lungs bilaterally, overall with slightly worsened aeration compared to the prior examination. No pleural effusions. No evidence of pulmonary edema. Heart size is normal. Upper mediastinal contours are within normal limits. Aortic atherosclerosis. IMPRESSION: 1. Slight worsening of multilobar bilateral pneumonia compatible with reported COVID infection, as above. 2. Aortic atherosclerosis. Electronically Signed   By: Trudie Reedaniel  Entrikin M.D.   On: 11/19/2019 06:04       LOS: 4 days   Tracy Massey  [facility] Pager on [email or web address]  11/20/2019, 11:20 AM

## 2019-11-20 NOTE — TOC Initial Note (Signed)
Transition of Care [facility]) - Initial/Assessment Note    Patient Details  Name: Tracy Massey MRN: [id number] Date of Birth: March 25, 1951  Transition of Care [facility]) CM/SW Contact:    Golda Acre, RN Phone Number: 11/20/2019, 8:03 AM  Clinical Narrative:                 Unvaccinated 69 year old female with hom exposure to covid and is covid positive.  HFNRBmask at 50%, iv solu medrol iv remdesivir through [id number].from home has pcp plan is to return to home.  Expected Discharge Plan: Home/Self Care Barriers to Discharge: Continued Medical Work up   Patient Goals and CMS Choice Patient states their goals for this hospitalization and ongoing recovery are:: to go home CMS Medicare.gov Compare Post Acute Care list provided to:: Patient    Expected Discharge Plan and Services Expected Discharge Plan: Home/Self Care   Discharge Planning Services: CM Consult   Living arrangements for the past 2 months: Single Family Home                                      Prior Living Arrangements/Services Living arrangements for the past 2 months: Single Family Home Lives with:: Spouse, Roommate Patient language and need for interpreter reviewed:: Yes Do you feel safe going back to the place where you live?: Yes      Need for Family Participation in Patient Care: Yes (Comment) Care giver support system in place?: Yes (comment)   Criminal Activity/Legal Involvement Pertinent to Current Situation/Hospitalization: No - Comment as needed  Activities of Daily Living Home Assistive Devices/Equipment: Eyeglasses, Nebulizer ADL Screening (condition at time of admission) Patient's cognitive ability adequate to safely complete daily activities?: No Is the patient deaf or have difficulty hearing?: No Does the patient have difficulty seeing, even when wearing glasses/contacts?: No Does the patient have difficulty concentrating, remembering, or making decisions?: Yes Patient able to  express need for assistance with ADLs?: No (patient with altered mental status) Does the patient have difficulty dressing or bathing?: Yes Independently performs ADLs?: No Communication: Independent Dressing (OT): Dependent Is this a change from baseline?: Change from baseline, expected to last >3 days Grooming: Dependent Is this a change from baseline?: Change from baseline, expected to last >3 days Feeding: Dependent Is this a change from baseline?: Change from baseline, expected to last >3 days Bathing: Dependent Is this a change from baseline?: Change from baseline, expected to last >3 days Toileting: Dependent Is this a change from baseline?: Change from baseline, expected to last >3days In/Out Bed: Dependent Is this a change from baseline?: Change from baseline, expected to last >3 days Walks in Home: Dependent Is this a change from baseline?: Change from baseline, expected to last >3 days Does the patient have difficulty walking or climbing stairs?: Yes (secondary to weakness) Weakness of Legs: Both Weakness of Arms/Hands: Both  Permission Sought/Granted                  Emotional Assessment Appearance:: Appears stated age     Orientation: : Oriented to Self, Oriented to Place, Oriented to  Time, Oriented to Situation Alcohol / Substance Use: Never Used Psych Involvement: No (comment)  Admission diagnosis:  ARF (acute renal failure) (HCC) [N17.9] Hypoxia [R09.02] Acute respiratory failure with hypoxia (HCC) [J96.01] Acute renal failure, unspecified acute renal failure type (HCC) [N17.9] Pneumonia due to COVID-19 virus [U07.1, J12.82] Patient Active  Problem List   Diagnosis Date Noted  . Elevated troponin 11/17/2019  . Lactic acid acidosis 11/17/2019  . Prolonged QT interval 11/17/2019  . Acute respiratory failure with hypoxia (HCC) 11/10/2019  . AKI (acute kidney injury) (HCC) 11/03/2019  . Pneumonia due to COVID-19 virus 10/31/2019  . Problem with right  great toe 08/15/2019  . Closed fracture of base of fifth metacarpal bone of right hand 07/17/2019  . Lumbar degenerative disc disease 07/11/2019  . Mood disorder (HCC) 07/10/2019  . Migraine with aura 06/20/2019  . Grief 12/14/2018  . Obesity, Class I, BMI 30-34.9 10/13/2018  . IFG (impaired fasting glucose) 07/13/2018  . Depression, major, single episode, moderate (HCC) 07/13/2018  . Encounter for chronic pain management 04/12/2018  . Primary insomnia 04/12/2018  . Peripheral vascular disease, unspecified (HCC) 12/08/2017  . Contrast media allergy 11/24/2017  . Blue toes 11/24/2017  . Embolism (HCC) 11/24/2017  . CKD stage G3b/A2, GFR 30-44 and albumin creatinine ratio 30-299 mg/g 12/16/2015  . DDD (degenerative disc disease), cervical 08/19/2015  . Microscopic hematuria 04/19/2014  . Chronic neck pain 02/24/2011  . Myofascial pain syndrome 02/24/2011  . Hyperlipidemia 11/29/2009  . POSTMENOPAUSAL STATUS 07/23/2009  . ATOPIC RHINITIS 05/30/2009  . GERD 05/30/2009  . Environmental allergies 05/30/2009  . Essential hypertension, benign 02/11/2009  . Cough variant asthma 02/11/2009   PCP:  Agapito Games, MD Pharmacy:   [facility] [phone] - [place], [place] - [address] [facility] [address] [place] [address] Phone: [phone] Fax: [phone]  [facility] - [place], [place] - [address] [place] [address] Phone: [phone] Fax: [phone]     Social Determinants of Health (SDOH) Interventions    Readmission Risk Interventions No flowsheet data found.

## 2019-11-20 NOTE — Progress Notes (Signed)
On call provider paged RE patient taking off mask and HHF. Pt is alert and oriented, but continues to take off mask and does not give reason for it when asked. Patient tachypnic, but unlabored. Increased oxygen demands also told to provider.

## 2019-11-21 ENCOUNTER — Inpatient Hospital Stay (HOSPITAL_COMMUNITY): Payer: Medicare HMO

## 2019-11-21 ENCOUNTER — Encounter (HOSPITAL_COMMUNITY): Payer: Self-pay | Admitting: Internal Medicine

## 2019-11-21 DIAGNOSIS — I639 Cerebral infarction, unspecified: Secondary | ICD-10-CM

## 2019-11-21 LAB — COMPREHENSIVE METABOLIC PANEL
ALT: 32 U/L (ref 0–44)
AST: 42 U/L — ABNORMAL HIGH (ref 15–41)
Albumin: 2.8 g/dL — ABNORMAL LOW (ref 3.5–5.0)
Alkaline Phosphatase: 92 U/L (ref 38–126)
Anion gap: 12 (ref 5–15)
BUN: 59 mg/dL — ABNORMAL HIGH (ref 8–23)
CO2: 19 mmol/L — ABNORMAL LOW (ref 22–32)
Calcium: 8.6 mg/dL — ABNORMAL LOW (ref 8.9–10.3)
Chloride: 109 mmol/L (ref 98–111)
Creatinine, Ser: 1.37 mg/dL — ABNORMAL HIGH (ref 0.44–1.00)
GFR calc Af Amer: 45 mL/min — ABNORMAL LOW (ref >60)
GFR calc non Af Amer: 39 mL/min — ABNORMAL LOW (ref >60)
Glucose, Bld: 170 mg/dL — ABNORMAL HIGH (ref 70–99)
Potassium: 5 mmol/L (ref 3.5–5.1)
Sodium: 140 mmol/L (ref 135–145)
Total Bilirubin: 0.7 mg/dL (ref 0.3–1.2)
Total Protein: 6.5 g/dL (ref 6.5–8.1)

## 2019-11-21 LAB — CBC WITH DIFFERENTIAL/PLATELET
Abs Immature Granulocytes: 0.13 10*3/uL — ABNORMAL HIGH (ref 0.00–0.07)
Basophils Absolute: 0 10*3/uL (ref 0.0–0.1)
Basophils Relative: 0 %
Eosinophils Absolute: 0 10*3/uL (ref 0.0–0.5)
Eosinophils Relative: 0 %
HCT: 42.9 % (ref 36.0–46.0)
Hemoglobin: 13.6 g/dL (ref 12.0–15.0)
Immature Granulocytes: 1 %
Lymphocytes Relative: 5 %
Lymphs Abs: 1 10*3/uL (ref 0.7–4.0)
MCH: 29.9 pg (ref 26.0–34.0)
MCHC: 31.7 g/dL (ref 30.0–36.0)
MCV: 94.3 fL (ref 80.0–100.0)
Monocytes Absolute: 0.6 10*3/uL (ref 0.1–1.0)
Monocytes Relative: 3 %
Neutro Abs: 17.5 10*3/uL — ABNORMAL HIGH (ref 1.7–7.7)
Neutrophils Relative %: 91 %
Platelets: 223 10*3/uL (ref 150–400)
RBC: 4.55 MIL/uL (ref 3.87–5.11)
RDW: 14 % (ref 11.5–15.5)
WBC: 19.3 10*3/uL — ABNORMAL HIGH (ref 4.0–10.5)
nRBC: 0 % (ref 0.0–0.2)

## 2019-11-21 LAB — GLUCOSE, CAPILLARY: Glucose-Capillary: 146 mg/dL — ABNORMAL HIGH (ref 70–99)

## 2019-11-21 LAB — MAGNESIUM: Magnesium: 2.3 mg/dL (ref 1.7–2.4)

## 2019-11-21 LAB — C-REACTIVE PROTEIN: CRP: 2 mg/dL — ABNORMAL HIGH (ref <1.0)

## 2019-11-21 LAB — D-DIMER, QUANTITATIVE: D-Dimer, Quant: 12.75 ug/mL-FEU — ABNORMAL HIGH (ref 0.00–0.50)

## 2019-11-21 MED ORDER — HYDRALAZINE HCL 20 MG/ML IJ SOLN
10.0000 mg | Freq: Four times a day (QID) | INTRAMUSCULAR | Status: DC | PRN
  Administered 2019-11-21 (×2): 10 mg via INTRAVENOUS
  Filled 2019-11-21 (×3): qty 1

## 2019-11-21 MED ORDER — EPINEPHRINE 1 MG/10ML IJ SOSY
PREFILLED_SYRINGE | INTRAMUSCULAR | Status: AC
  Filled 2019-11-21: qty 10

## 2019-11-21 MED ORDER — ASPIRIN 300 MG RE SUPP
300.0000 mg | Freq: Every day | RECTAL | Status: DC
  Administered 2019-11-21 – 2019-11-23 (×3): 300 mg via RECTAL
  Filled 2019-11-21 (×3): qty 1

## 2019-11-21 MED ORDER — PHENYLEPHRINE 40 MCG/ML (10ML) SYRINGE FOR IV PUSH (FOR BLOOD PRESSURE SUPPORT)
PREFILLED_SYRINGE | INTRAVENOUS | Status: AC
  Filled 2019-11-21: qty 10

## 2019-11-21 MED ORDER — DIPHENHYDRAMINE HCL 50 MG/ML IJ SOLN
INTRAMUSCULAR | Status: AC
  Filled 2019-11-21: qty 1

## 2019-11-21 NOTE — Progress Notes (Signed)
PT Cancellation Note  Patient Details Name: NEEVA TREW MRN: [id number] DOB: 1951-02-22   Cancelled Treatment:    Reason Eval/Treat Not Completed: Medical issues which prohibited therapy--acute CVA. Will sign off at this time.    Faye Ramsay, PT Acute Rehabilitation  Office: [phone] Pager: [phone]

## 2019-11-21 NOTE — Consult Note (Signed)
[facility] TeleNeurology Consult Services   Date of Service:   11/21/2019 10:38:38  Impression:     . Q03.474 - Cerebrovascular accident (CVA) due to embolism of right middle cerebral artery (Tracy Massey)  Comments/Sign-Out: Pt is a 10 YOF with PMH of HTN, HLD, PVD, currrent COVID infection who developed left sided weakness, LKN is not clear. NIHSS: 23. CT shows large right MCA infarc and probable thrombus. Discussed with daughter and will defer CTA for now. Continue stroke, covid, and other medical management.  Metrics: Last Known Well: Unknown [facility] Notification Time: 11/21/2019 10:38:38 Stamp Time: 11/21/2019 10:38:38 Time First Login Attempt: 11/21/2019 10:41:00 Symptoms: left sided weakness. NIHSS Start Assessment Time: 11/21/2019 10:47:09 Patient is not a candidate for Thrombolytic. Thrombolytic Medical Decision: 11/21/2019 10:55:44 Patient was not deemed candidate for Thrombolytic because of following reasons: CT Demonstrating Extensive Infarction.  CT head was reviewed and results were: large right MCA stroke, no ICH.  Radiologist was not called back for review of advanced imaging.  Advanced Imaging Not Recommended because: probably completed stroke.   Our recommendations are outlined below.  Recommendations:     .  Activate Stroke Protocol Admission/Order Set     .  Stroke/Telemetry Floor     .  Neuro Checks     .  Bedside Swallow Eval     .  DVT Prophylaxis     .  IV Fluids, Normal Saline     .  Head of Bed 30 Degrees     .  Euglycemia and Avoid Hyperthermia (PRN Acetaminophen)     .  Monitor neuro checks/VS q4h with telemetry.     .  Recommend fall precautions and seizure precautions.     .  Goal SBP b/w 140-160 today and afterwards 100-140.     .  Cont. ASA + STATIN if no contraindications.     .  Get MRI BRAIN W/O and ECHO.     Marland Kitchen  Get ESR/CRP, D-Dimer, CK, TSH, B12, BNP, LACTIC ACID, LIPID PANEL, and A1C.  Routine Consultation with [facility]  for Follow up Care  Sign Out:     .  Discussed with Emergency Department Provider    ------------------------------------------------------------------------------  History of Present Illness: Patient is a 69 year old Female.  Inpatient stroke alert was called for symptoms of left sided weakness.  Pt is a 75 YOF with PMH of HTN, HLD, PVD who presented with SOB and noted to have COVID 19 which started approximately 6 days prior to admission on 11/17/2019. She was noted to have left sided weakness this morning around 0800-0830 and unclear when it started. She had been hypoxic and altered requiring 15L of O2 and getting haldol/morphine for agitation issues.   Spoke to daughter Tracy Massey [phone]) regarding case. Confirmed DNR code status.   -D-dimer; 3.77 -> 12.75, BUN/Cr: 59/1.37, AST: 42, CRP: 2.0  There is history of recent stroke.  Past Medical History:     . Hypertension     . Hyperlipidemia     . There is NO history of Diabetes Mellitus     . There is NO history of Atrial Fibrillation     . There is NO history of Coronary Artery Disease     . There is NO history of Stroke  Social History: Smoking: No Alcohol Use: No Drug Use: No  Family History:Unable to assess  Anticoagulant use:  No  Antiplatelet use: ASA    Examination: BP(169/95), Pulse(97), Blood Glucose(142) 1A: Level of Consciousness - Alert; keenly  responsive + 0 1B: Ask Month and Age - Could Not Answer Either Question Correctly + 2 1C: Blink Eyes & Squeeze Hands - Performs 0 Tasks + 2 2: Test Horizontal Extraocular Movements - Normal + 0 3: Test Visual Fields - No Visual Loss + 0 4: Test Facial Palsy (Use Grimace if Obtunded) - Minor paralysis (flat nasolabial fold, smile asymmetry) + 1 5A: Test Left Arm Motor Drift - No Movement + 4 5B: Test Right Arm Motor Drift - Drift, but doesn't hit bed + 1 6A: Test Left Leg Motor Drift - No Movement + 4 6B: Test Right Leg Motor Drift - No Effort Against  Gravity + 3 7: Test Limb Ataxia (FNF/Heel-Shin) - Does Not Understand + 0 8: Test Sensation - Mild-Moderate Loss: Can Sense Being Touched + 1 9: Test Language/Aphasia - Mute/Global Aphasia: No Usable Speech/Auditory Comprehension + 3 10: Test Dysarthria - Mute/Anarthric + 2 11: Test Extinction/Inattention - No abnormality + 0  NIHSS Score: 23  Pre-Morbid Modified Rankin Scale: Unable to assess   Patient/Family was informed the Neurology Consult would occur via TeleHealth consult by way of interactive audio and video telecommunications and consented to receiving care in this manner.   Patient is being evaluated for possible acute neurologic impairment and high probability of imminent or life-threatening deterioration. I spent total of 50 minutes providing care to this patient, including time for face to face visit via telemedicine, review of medical records, imaging studies and discussion of findings with providers, the patient and/or family.   Dr Currie Paris   [facility] [phone]  Case [id number]

## 2019-11-21 NOTE — Progress Notes (Signed)
Spoke with pt's daughter Marcelino Duster, she ask that we not give any information to anyone that calls.

## 2019-11-21 NOTE — Evaluation (Signed)
SLP Cancellation Note  Patient Details Name: Tracy Massey MRN: [id number] DOB: [birth year]   Cancelled treatment:       Reason Eval/Treat Not Completed: Other (comment);Medical issues which prohibited therapy (pt currently on HFNC and NRB for respiratory issues, note CVA documented, will see pt tomorrow for readiness for eval/po. Thanks for the consult.)   Chales Abrahams 11/21/2019, 5:32 PM

## 2019-11-21 NOTE — Progress Notes (Signed)
Patient was having an increased work of breathing and taking off NRB due to confusion and AMS. Orders for bilateral wrist restraints given and placed on patient. 2mg  of Morphine given to help decrease respiratory rate and lower BP. Patient is now resting comfortably and work of breathing has decreased some. Will continue to monitor patient closely.

## 2019-11-21 NOTE — Progress Notes (Addendum)
PROGRESS NOTE  Tracy Massey RUE:[id number] DOB: 05-Nov-1950 DOA: 2019/11/21  PCP: Agapito Games, MD  Brief History/Interval Summary: 69 y.o. female with medical history significant of asthma, HLD, HTN, PVD presented with shortness of breath and was noted to be confused as well.  She apparently was diagnosed with COVID-19 a few days ago.  Her symptoms started 6 days prior to admission.  Apparently she was on the way to the infusion center to get monoclonal antibodies but then she apparently got confused and got lost in the parking lot.  She was noted to be cyanotic and hypotensive and hypoxic.  She was promptly taken to the emergency department.  She was noted to be profoundly hypoxic and required 15 L of oxygen.  She was subsequently hospitalized for further management.  She has not yet been vaccinated against COVID-19.  Reason for Visit: Acute respiratory failure with hypoxia.  Pneumonia due to COVID-19.  Acute stroke  Consultants: Neurology  Procedures: None  Antibiotics: Anti-infectives (From admission, onward)   Start     Dose/Rate Route Frequency Ordered Stop   11/17/19 1200  doxycycline (VIBRA-TABS) tablet 100 mg        100 mg Oral Every 12 hours 11/17/19 1117     11/17/19 1000  remdesivir 100 mg in sodium chloride 0.9 % 100 mL IVPB  Status:  Discontinued       "Followed by" Linked Group Details   100 mg 200 mL/hr over 30 Minutes Intravenous Daily 11-21-19 2253 11/21/2019 2256   11/17/19 1000  remdesivir 100 mg in sodium chloride 0.9 % 100 mL IVPB       "Followed by" Linked Group Details   100 mg 200 mL/hr over 30 Minutes Intravenous Daily 2019/11/21 2147 11/20/19 1108   21-Nov-2019 2253  remdesivir 200 mg in sodium chloride 0.9% 250 mL IVPB  Status:  Discontinued       "Followed by" Linked Group Details   200 mg 580 mL/hr over 30 Minutes Intravenous Once 11/21/19 2253 21-Nov-2019 2256   21-Nov-2019 2200  remdesivir 200 mg in sodium chloride 0.9% 250 mL IVPB       "Followed  by" Linked Group Details   200 mg 580 mL/hr over 30 Minutes Intravenous Once 2019/11/21 2147 11-21-19 2316      Subjective/Interval History: Overnight events noted.  This morning patient was detected not to be moving the left side of her body.  There was some facial drooping.  Patient not very communicative.    Assessment/Plan:  Acute Hypoxic Resp. Failure/Pneumonia due to COVID-19  Recent Labs  Lab 2019/11/21 1944 11-21-19 2025 2019-11-21 2025 11/17/19 0525 11/18/19 0500 11/19/19 0206 11/20/19 0146 11/21/19 0233  DDIMER 1.76*  --    < > 1.71* 2.42* 2.80* 3.77* 12.75*  FERRITIN  --  577*  --  574* 547*  --   --   --   CRP  --  11.8*   < > 8.6* 6.4* 4.1* 3.0* 2.0*  ALT  --   --    < > 33 32  PROCALCITON 0.47  --   --   --   --   --   --   --    < > = values in this interval not displayed.    Objective findings: Fever: Remains afebrile Oxygen requirements: Heated HFNC 25L plus nonrebreather.  Saturating in the late 80s to early 90s.  COVID 19 Therapeutics: Antibacterials: Procalcitonin noted to be 0.47.  Allergy to penicillin and  cephalosporins noted.  QT prolongation noted.  She was placed on doxycycline. Remdesivir: Completed course on 8/23 Steroids: Solu-Medrol 40 mg twice a day Diuretics: None Actemra/Baricitinib: Received Actemra on 8/19 PUD Prophylaxis: None DVT Prophylaxis:  Lovenox 30 mg daily  Patient's respiratory status remains tenuous.  However the amount of oxygen she is receiving seems to less than yesterday.  She remains on steroids.  She completed course of Remdesivir.  She also received Actemra on 8/19.  She was given antibacterials for elevated procalcitonin.  Leukocytosis most likely due to steroids.  CRP noted to be better but D-dimer has climbed significantly to 12.75.  This is most likely due to the acute stroke.  The treatment plan and use of medications and known side effects were discussed with patient/family. Some of the medications used  are based on case reports/anecdotal data.  All other medications being used in the management of COVID-19 based on limited study data.  Complete risks and long-term side effects are unknown, however in the best clinical judgment they seem to be of some benefit.  Patient/family wanted to proceed with treatment options provided.  Acute stroke involving the right MCA Patient with left hemiparesis.  CT scan showed dense right MCA stroke.  Last known well time is unknown.  Patient had been encephalopathic yesterday but did not have any focal deficits at that time.  Results of the CT scan communicated to the patient.  The fact that she is on heated high flow makes getting other studies very challenging.  All of these issues discussed with the patient's daughter who does not want any aggressive interventions or evaluations at this time.  Would like to see how the patient does from a respiratory standpoint first.  We will hold off on getting MRI or initiating stroke order-set per daughter's wishes.  Patient did undergo echocardiogram 4 days ago.  Patient is not a candidate for TPA since time of onset of stroke is not clear.  Not a candidate for thrombectomy due to high risk of bleeding.  Daughter is not interested in these procedures.  Aspirin will be given rectally.  Speech therapy evaluation.  Hold all of her oral medications for now.  Acute metabolic encephalopathy secondary to COVID-19 Now complicated by acute stroke as discussed above.  Acute kidney injury on chronic kidney disease stage IIIb/metabolic acidosis Based on review of old records it looks like her baseline creatinine is close to 1.5-1.6.  She presented with a creatinine of 4.29.   Patient had reported poor oral intake and multiple episodes of loose stool.  Her acute renal failure was thought to be due to hypovolemia.  Patient was aggressively hydrated.  Renal ultrasound did not show any hydronephrosis.   Renal function has improved with  creatinine down to 1.37.  Monitor urine output.  IV fluids were discontinued.  Metabolic acidosis is stable.    Essential hypertension Permissive hypertension for now.  Hold the amlodipine.  Lisinopril on hold due to elevated creatinine.  Lactic acidosis Secondary to hypovolemia.  No evidence for sepsis.  Improved with IV hydration.    History of cough variant asthma Albuterol as needed.  History of hyperlipidemia Continue statin.  AST noted to be mildly elevated..  Mildly elevated troponin Most likely due to demand ischemia from acute illness.  EKG showed nonspecific T wave changes.  Patient denies any chest pain.  Echocardiogram shows normal systolic function.  Right ventricle was also normal.  No further cardiac work-up at this time.   Bacteremia  Blood culture with staph epidermidis.  She is afebrile.  This is most likely a contaminant.  No need to add antibiotics at this time.  QT prolongation Avoid QT prolonging medication as much as possible.  Subsequent EKG showed normal QT interval.  History of peripheral vascular disease Appears to be stable.  Questionable psychiatric history Patient noted to be on Abilify as well as Topamax.  Also noted to be on Cymbalta.    Obesity Estimated body mass index is 32.46 kg/m as calculated from the following:   Height as of this encounter:  (1.575 m).   Weight as of this encounter: 80.5 kg.  Goals of care Long discussion with daughter today in the setting of finding of acute stroke.  Patient still with acute respiratory failure with hypoxia due to COVID-19.  Prognosis is guarded to poor at this time.  Patient's daughter would like for [place] to continue current treatment for now without escalation if she were to worsen.  She does not also want any neurological testing at this point in time.  She does not want any CPR or life support based on patient's previously known wishes.  We will change her to DNR.  Continue current treatment.   Depending on her progress in the next 24 to 48 hours will need to decide whether to transition her to comfort care.  DVT Prophylaxis: Lovenox Code Status: Full code Family Communication: Discussed with daughter. Disposition Plan: Hopefully return home when improved  Status is: Inpatient  Remains inpatient appropriate because:IV treatments appropriate due to intensity of illness or inability to take PO and Inpatient level of care appropriate due to severity of illness   Dispo:  Patient From: Home  Planned Disposition: To be determined  Expected discharge date: 11/24/19  Medically stable for discharge: No     Medications:  Scheduled: . (feeding supplement) PROSource Plus  30 mL Oral TID BM  . amLODipine  5 mg Oral Daily  . ARIPiprazole  10 mg Oral Daily  . vitamin C  500 mg Oral Daily  . atorvastatin  10 mg Oral QHS  . chlorhexidine  15 mL Mouth Rinse BID  . Chlorhexidine Gluconate Cloth  6 each Topical Daily  . Chlorhexidine Gluconate Cloth  6 each Topical Q0600  . diphenhydrAMINE      . doxycycline  100 mg Oral Q12H  . DULoxetine  60 mg Oral Daily  . enoxaparin (LOVENOX) injection  40 mg Subcutaneous Q24H  . EPINEPHrine      . feeding supplement  1 Container Oral TID BM  . [START ON 2019-12-08] HYDROcodone-acetaminophen  1 tablet Oral Q8H  . mouth rinse  15 mL Mouth Rinse q12n4p  . methylPREDNISolone (SOLU-MEDROL) injection  40 mg Intravenous Q12H  . metoprolol succinate  50 mg Oral Daily  . multivitamin with minerals  1 tablet Oral Q24H  . mupirocin ointment  1 application Nasal BID  . phenylephrine      . sodium chloride flush  3 mL Intravenous Q12H  . topiramate  25 mg Oral BID  . zinc sulfate  220 mg Oral Daily   Continuous:  ZOX:[id number], albuterol, guaiFENesin-dextromethorphan, haloperidol lactate, hydrALAZINE, morphine injection, ondansetron (ZOFRAN) IV, phenol   Objective:  Vital Signs  Vitals:   11/21/19 0745 11/21/19 1100 11/21/19 1138 11/21/19  1200  BP:      Pulse: (!) 59     Resp: (!) 22     Temp:    97.9 F (36.6 C)  TempSrc:  Axillary  SpO2: 95% 96% (!) 89%   Weight:      Height:        Intake/Output Summary (Last 24 hours) at 11/21/2019 1240 Last data filed at 11/21/2019 0640 Gross per 24 hour  Intake --  Output 850 ml  Net -850 ml   Filed Weights   11/17/19 1241 11/18/19 1352 11/21/19 0704  Weight: 78.9 kg 82.4 kg 80.5 kg    General appearance: Not very responsive currently. Resp: Tachypneic.  Crackles bilateral bases.  No wheezing or rhonchi.  Cardio: S1-S2 is normal regular.  No S3-S4.  No rubs murmurs or bruit GI: Abdomen is soft.  Nontender nondistended.  Bowel sounds are present normal.  No masses organomegaly Extremities: No edema.  Full range of motion of lower extremities. Neurologic: Left-sided hemiparesis noted.     Lab Results:  Data Reviewed: I have personally reviewed following labs and imaging studies  CBC: Recent Labs  Lab 11/17/19 0525 11/18/19 0500 11/19/19 0206 11/20/19 0146 11/21/19 0233  WBC 11.6* 10.5 10.4 14.3* 19.3*  NEUTROABS 9.7* 8.6* 8.9* 12.9* 17.5*  HGB 12.6 12.9 12.6 12.9 13.6  HCT 39.3 39.3 40.1 40.8 42.9  MCV 94.2 92.3 93.7 94.2 94.3  PLT 260 275 295 313 223    Basic Metabolic Panel: Recent Labs  Lab 11/17/19 0525 11/18/19 0500 11/19/19 0206 11/20/19 0146 11/21/19 0233  NA 139 142 142 143 140  K 4.2 4.1 3.8 3.8 5.0  CL 103 109 113* 113* 109  CO2 18* 20* 18* 17* 19*  GLUCOSE 127* 130* 150* 146* 170*  BUN 63* 66* 64* 61* 59*  CREATININE 4.42* 2.75* 1.88* 1.66* 1.37*  CALCIUM 8.4* 8.0* 8.2* 8.4* 8.6*  MG 1.9 1.8 1.8 1.9 2.3    GFR: Estimated Creatinine Clearance: 37.6 mL/min (A) (by C-G formula based on SCr of 1.37 mg/dL (H)).  Liver Function Tests: Recent Labs  Lab 11/17/19 0525 11/18/19 0500 11/19/19 0206 11/20/19 0146 11/21/19 0233  AST 50* 56* 47* 46* 42*  ALT 24 28 29  33 32  ALKPHOS 80 81 77 82 92  BILITOT 0.6 0.7 0.6 1.0 0.7  PROT  7.0 6.8 6.2* 6.3* 6.5  ALBUMIN 2.7* 2.6* 2.6* 2.7* 2.8*    CBG: Recent Labs  Lab 11/17/19 0754 11/20/19 0025 11/21/19 0848  GLUCAP 109* 145* 146*      Recent Results (from the past 240 hour(s))  Novel Coronavirus, NAA ([facility])     Status: Abnormal   Collection Time: 11/13/19 12:00 AM   Specimen: Nasal Swab; Saline   Saline  Is this test f  Result Value Ref Range Status   SARS-CoV-2, NAA Detected (A) Not Detected Final    Comment: Patients who have a positive COVID-19 test result may now have treatment options. Treatment options are available for patients with mild to moderate symptoms and for hospitalized patients. Visit our website at 11/15/19 for resources and information. This nucleic acid amplification test was developed and its performance characteristics determined by [employer]. Nucleic acid amplification tests include RT-PCR and TMA. This test has not been FDA cleared or approved. This test has been authorized by FDA under an Emergency Use Authorization (EUA). This test is only authorized for the duration of time the declaration that circumstances exist justifying the authorization of the emergency use of in vitro diagnostic tests for detection of SARS-CoV-2 virus and/or diagnosis of COVID-19 infection under section 564(b)(1) of the Act, 21 U.S.C. [employer]) (1), unless the authorization is terminated or revoked sooner. When diagnostic  testing is negativ e, the possibility of a false negative result should be considered in the context of a patient's recent exposures and the presence of clinical signs and symptoms consistent with COVID-19. An individual without symptoms of COVID-19 and who is not shedding SARS-CoV-2 virus would expect to have a negative (not detected) result in this assay.   SARS-COV-2, NAA 2 DAY TAT     Status: None   Collection Time: 11/13/19 12:00 AM   Saline  Is this test f  Result Value Ref Range Status    SARS-CoV-2, NAA 2 DAY TAT Performed  Final  Blood Culture (routine x 2)     Status: Abnormal   Collection Time: 12/11/2019  8:25 PM   Specimen: BLOOD  Result Value Ref Range Status   Specimen Description   Final    BLOOD RIGHT ANTECUBITAL Performed at [facility], [address] [place], [place] [address]    Special Requests   Final    BOTTLES DRAWN AEROBIC AND ANAEROBIC Blood Culture adequate volume Performed at [facility], [address] [place], [place] [address]    Culture  Setup Time   Final    ANAEROBIC BOTTLE ONLY GRAM POSITIVE COCCI Organism ID to follow CRITICAL RESULT CALLED TO, READ BACK BY AND VERIFIED WITH: Damaris Hippo [facility] 11/18/19/0330 JDW    Culture (A)  Final    STAPHYLOCOCCUS EPIDERMIDIS THE SIGNIFICANCE OF ISOLATING THIS ORGANISM FROM A SINGLE SET OF BLOOD CULTURES WHEN MULTIPLE SETS ARE DRAWN IS UNCERTAIN. PLEASE NOTIFY THE MICROBIOLOGY DEPARTMENT WITHIN ONE WEEK IF SPECIATION AND SENSITIVITIES ARE REQUIRED. Performed at [facility], [address], [place], [place] [address]    Report Status 11/20/2019 FINAL  Final  Blood Culture (routine x 2)     Status: None (Preliminary result)   Collection Time: 11-Dec-2019  8:25 PM   Specimen: BLOOD  Result Value Ref Range Status   Specimen Description   Final    BLOOD RIGHT ANTECUBITAL Performed at [facility], [address], [place], [place] [address]    Special Requests   Final    BOTTLES DRAWN AEROBIC AND ANAEROBIC Blood Culture adequate volume Performed at [facility], [address], [place], [place] [address]    Culture   Final    NO GROWTH 4 DAYS Performed at [facility], [address], [place], [place] [address]    Report Status PENDING  Incomplete  Blood Culture ID Panel (Reflexed)     Status: Abnormal   Collection Time: 12/11/19  8:25 PM  Result Value Ref Range Status   Enterococcus faecalis NOT DETECTED NOT  DETECTED Final   Enterococcus Faecium NOT DETECTED NOT DETECTED Final   Listeria monocytogenes NOT DETECTED NOT DETECTED Final   Staphylococcus species DETECTED (A) NOT DETECTED Final    Comment: CRITICAL RESULT CALLED TO, READ BACK BY AND VERIFIED WITH: Damaris Hippo [facility] 11/18/19 0330 JDW    Staphylococcus aureus (BCID) NOT DETECTED NOT DETECTED Final   Staphylococcus epidermidis DETECTED (A) NOT DETECTED Final    Comment: Methicillin (oxacillin) resistant coagulase negative staphylococcus. Possible blood culture contaminant (unless isolated from more than one blood culture draw or clinical case suggests pathogenicity). No antibiotic treatment is indicated for blood  culture contaminants. CRITICAL RESULT CALLED TO, READ BACK BY AND VERIFIED WITH: Damaris Hippo [facility] 11/18/19 0330 JDW    Staphylococcus lugdunensis NOT DETECTED NOT DETECTED Final   Streptococcus species NOT DETECTED NOT DETECTED Final  Streptococcus agalactiae NOT DETECTED NOT DETECTED Final   Streptococcus pneumoniae NOT DETECTED NOT DETECTED Final   Streptococcus pyogenes NOT DETECTED NOT DETECTED Final   A.calcoaceticus-baumannii NOT DETECTED NOT DETECTED Final   Bacteroides fragilis NOT DETECTED NOT DETECTED Final   Enterobacterales NOT DETECTED NOT DETECTED Final   Enterobacter cloacae complex NOT DETECTED NOT DETECTED Final   Escherichia coli NOT DETECTED NOT DETECTED Final   Klebsiella aerogenes NOT DETECTED NOT DETECTED Final   Klebsiella oxytoca NOT DETECTED NOT DETECTED Final   Klebsiella pneumoniae NOT DETECTED NOT DETECTED Final   Proteus species NOT DETECTED NOT DETECTED Final   Salmonella species NOT DETECTED NOT DETECTED Final   Serratia marcescens NOT DETECTED NOT DETECTED Final   Haemophilus influenzae NOT DETECTED NOT DETECTED Final   Neisseria meningitidis NOT DETECTED NOT DETECTED Final   Pseudomonas aeruginosa NOT DETECTED NOT DETECTED Final   Stenotrophomonas maltophilia NOT DETECTED NOT DETECTED  Final   Candida albicans NOT DETECTED NOT DETECTED Final   Candida auris NOT DETECTED NOT DETECTED Final   Candida glabrata NOT DETECTED NOT DETECTED Final   Candida krusei NOT DETECTED NOT DETECTED Final   Candida parapsilosis NOT DETECTED NOT DETECTED Final   Candida tropicalis NOT DETECTED NOT DETECTED Final   Cryptococcus neoformans/gattii NOT DETECTED NOT DETECTED Final   Methicillin resistance mecA/C DETECTED (A) NOT DETECTED Final    Comment: CRITICAL RESULT CALLED TO, READ BACK BY AND VERIFIED WITH: Damaris HippoM [facility] 11/18/19 0330 JDW Performed at [facility], [address] [place], [place] [address]   MRSA PCR Screening     Status: Abnormal   Collection Time: 11/18/19  1:52 PM   Specimen: Nasal Mucosa; Nasopharyngeal  Result Value Ref Range Status   MRSA by PCR POSITIVE (A) NEGATIVE Final    Comment:        The GeneXpert MRSA Assay (FDA approved for NASAL specimens only), is one component of a comprehensive MRSA colonization surveillance program. It is not intended to diagnose MRSA infection nor to guide or monitor treatment for MRSA infections. RESULT CALLED TO, READ BACK BY AND VERIFIED WITH: E.HEAVNER,RN [id number] @1623  BY V.WILKINS Performed at [facility], [address] [place], [place] [address]       Radiology Studies: DG Chest Port 1 View  Result Date: 11/21/2019 CLINICAL DATA:  Pneumonia, COVID-19 EXAM: PORTABLE CHEST 1 VIEW COMPARISON:  Portable exam 0441 hours compared to 11/19/2019 FINDINGS: Normal heart size, mediastinal contours, and pulmonary vascularity. Normal heart size, mediastinal contours, and pulmonary vascularity. Bibasilar pulmonary infiltrates consistent with multifocal pneumonia. No pleural effusion or pneumothorax. Bones demineralized. IMPRESSION: Persistent BILATERAL pulmonary infiltrates consistent with multifocal pneumonia and history of COVID-19 . Electronically Signed   By: Ulyses SouthwardMark  Boles M.D.   On: 11/21/2019  07:10   CT HEAD CODE STROKE WO CONTRAST`  Addendum Date: 11/21/2019   ADDENDUM REPORT: 11/21/2019 11:01 ADDENDUM: These results were called by telephone at the time of interpretation on 11/21/2019 at 11:00 am to provider [facility] Anarie Kalish , who verbally acknowledged these results. Electronically Signed   By: Marlan Palauharles  Clark M.D.   On: 11/21/2019 11:01   Result Date: 11/21/2019 CLINICAL DATA:  Code stroke.  Mental status change. EXAM: CT HEAD WITHOUT CONTRAST TECHNIQUE: Contiguous axial images were obtained from the base of the skull through the vertex without intravenous contrast. COMPARISON:  None. FINDINGS: Brain: Hypodensity throughout much of the right MCA territory compatible with acute infarct. This involves portions of the basal ganglia, insula as well  as the right frontal and temporal lobe. No associated hemorrhage. Ventricle size normal. No midline shift. No other acute infarct, hemorrhage, or mass. Vascular: Hyperdense right MCA compatible with thrombosis. Skull: Negative Sinuses/Orbits: Air-fluid level right maxillary sinus. Remaining sinuses clear. Negative orbit. Other: None ASPECTS (Alberta Stroke Program Early CT Score) - Ganglionic level infarction (caudate, lentiform nuclei, internal capsule, insula, M1-M3 cortex): 1 - Supraganglionic infarction (M4-M6 cortex): 0 Total score (0-10 with 10 being normal): 1 IMPRESSION: 1. 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 Hyperdense right MCA compatible with acute thrombus. No acute hemorrhage 2. ASPECTS is 1 Electronically Signed: By: Marlan Palau M.D. On: 11/21/2019 10:52       LOS: 5 days   Tynika Luddy Rito Ehrlich  [facility] Pager on [email or web address]  11/21/2019, 12:40 PM

## 2019-11-21 NOTE — Progress Notes (Addendum)
CT head reviewed. Large right MCA territory hypodensity consistent with early subacute ischemic infarction. High risk of bleeding if thrombectomy is performed. Risks of thrombectomy significantly outweigh benefits.   Electronically signed: Dr. Caryl Pina

## 2019-11-21 NOTE — Progress Notes (Signed)
This RN Went in to assess patient around 0830. On assessment, left sided facial drooping was noted. Assessed grips in both hands and left sided grip was absent. When assessing plantar extension, patient was noted to have strong extension on the right side and unable to follow commands with the left side. MD made aware, code stroke was called.

## 2019-11-21 NOTE — Progress Notes (Signed)
OT Cancellation Note  Patient Details Name: MALONIE TATUM MRN: [id number] DOB: Aug 20, 1950   Cancelled Treatment:    Reason Eval/Treat Not Completed: Patient not medically ready, patient with acute CVA. OT will sign off at this time, please re-order when medically appropriate. Thank you.   Marlyce Huge OT OT pager: [phone]   Carmelia Roller 11/21/2019, 11:57 AM

## 2019-11-22 LAB — COMPREHENSIVE METABOLIC PANEL
ALT: 33 U/L (ref 0–44)
AST: 38 U/L (ref 15–41)
Albumin: 3.1 g/dL — ABNORMAL LOW (ref 3.5–5.0)
Alkaline Phosphatase: 94 U/L (ref 38–126)
Anion gap: 13 (ref 5–15)
BUN: 60 mg/dL — ABNORMAL HIGH (ref 8–23)
CO2: 20 mmol/L — ABNORMAL LOW (ref 22–32)
Calcium: 8.7 mg/dL — ABNORMAL LOW (ref 8.9–10.3)
Chloride: 113 mmol/L — ABNORMAL HIGH (ref 98–111)
Creatinine, Ser: 1.47 mg/dL — ABNORMAL HIGH (ref 0.44–1.00)
GFR calc Af Amer: 41 mL/min — ABNORMAL LOW (ref >60)
GFR calc non Af Amer: 36 mL/min — ABNORMAL LOW (ref >60)
Glucose, Bld: 195 mg/dL — ABNORMAL HIGH (ref 70–99)
Potassium: 4 mmol/L (ref 3.5–5.1)
Sodium: 146 mmol/L — ABNORMAL HIGH (ref 135–145)
Total Bilirubin: 1 mg/dL (ref 0.3–1.2)
Total Protein: 6.5 g/dL (ref 6.5–8.1)

## 2019-11-22 LAB — CBC
HCT: 41.2 % (ref 36.0–46.0)
Hemoglobin: 13.4 g/dL (ref 12.0–15.0)
MCH: 30.1 pg (ref 26.0–34.0)
MCHC: 32.5 g/dL (ref 30.0–36.0)
MCV: 92.6 fL (ref 80.0–100.0)
Platelets: 226 10*3/uL (ref 150–400)
RBC: 4.45 MIL/uL (ref 3.87–5.11)
RDW: 13.9 % (ref 11.5–15.5)
WBC: 16.3 10*3/uL — ABNORMAL HIGH (ref 4.0–10.5)
nRBC: 0 % (ref 0.0–0.2)

## 2019-11-22 LAB — CULTURE, BLOOD (ROUTINE X 2)
Culture: NO GROWTH
Special Requests: ADEQUATE

## 2019-11-22 LAB — LIPID PANEL
Cholesterol: 88 mg/dL (ref 0–200)
HDL: 26 mg/dL — ABNORMAL LOW (ref >40)
LDL Cholesterol: 34 mg/dL (ref 0–99)
Total CHOL/HDL Ratio: 3.4 RATIO
Triglycerides: 141 mg/dL (ref <150)
VLDL: 28 mg/dL (ref 0–40)

## 2019-11-22 LAB — D-DIMER, QUANTITATIVE: D-Dimer, Quant: >20 ug/mL-FEU — ABNORMAL HIGH (ref 0.00–0.50)

## 2019-11-22 NOTE — Progress Notes (Signed)
PROGRESS NOTE  Tracy Massey  DOB: 1951/03/29  PCP: Agapito Games, MD SJG:[id number]  DOA: 11-18-2019  LOS: 6 days   Chief Complaint  Patient presents with  . Shortness of Breath    Brief narrative: Tracy Massey is a 69 y.o. female with PMH of HTN, HLD, asthma, PVD. Patient presented to the ED on 11/18/2019 with complaint of shortness of breath and altered mental status. 8/16, patient was diagnosed with Covid positive as an outpatient.  She had shortness of breath for 3 days at the time. Apparently she was on the way to the infusion center to get monoclonal antibodies when she got confused and got lost in the parking lot. She was noted to be cyanotic, hypoxic and hypotensive.  She was brought to the ED. In the ED, she required 15 L oxygen to maintain oxygen saturation. Chest x-ray showed patchy bilateral lung opacities. She was admitted for Covid pneumonia.  Unfortunately, on the morning of 8/24, patient was noted to have dense hemiparesis on the left side.  CT scan of head showed large territory acute infarct right MCA territory with diffuse hypodensity seen throughout the infarct. Hyperdense right MCA compatible with acute thrombus. No acute hemorrhage. Seen by neurology.  Based on her high risk of bleeding, thrombectomy was not performed. Family decided to make her DNR and not pursue further aggressive investigation and management for stroke.  Subjective: Patient was seen and examined this morning. Elderly Caucasian female.  Remains on high flow oxygen by nasal cannula. Barely able to open eyes on sternal rub.  Moaning.  Assessment/Plan: COVID pneumonia Acute respiratory failure with hypoxia  -Presented with worsening COVID symptoms, hypoxia -COVID test: PCR positive on 8/16 -Chest imaging: Chest x-ray on admission with patchy bilateral lung space opacities -Treatment: Completed course of IV remdesivir on 8/23.  Currently remains on IV Solu-Medrol 40 mg  twice daily.  She received a dose of Actemra on 8/19.  Because of elevated procalcitonin level, she also received a course of oral doxycycline. -Despite standard treatment, patient continues to remain on high flow oxygen currently requiring 30 L/min.   -Supportive care: Vitamin C, Zinc, inhalers, Tylenol, Antitussives (benzonatate/ Mucinex/Tussionex)  -Continue airborne/contact isolation precautions. -WBC, lactic acid, procalcitonin and inflammatory markers trend as below.  Overall improvement in inflammatory markers.  D-dimer is acutely high probably because of acute stroke.  Lab Results  Component Value Date   SARSCOV2NAA Detected (A) 11/13/2019    Recent Labs  Lab 18-Nov-2019 1915 18-Nov-2019 1915 11/18/19 1944 2019/11/18 2025 11/17/19 0050 11/17/19 0525 11/17/19 0525 11/17/19 0756 11/17/19 1142 11/18/19 0500 11/19/19 0206 11/20/19 0146 11/21/19 0233 11/22/19 0157  WBC 18.1*   < >  --   --   --  11.6*   < >  --   --  10.5 10.4 14.3* 19.3* 16.3*  LATICACIDVEN  --   --   --  3.1* 2.5* 2.6*  --  2.0* 2.5*  --   --   --   --   --   PROCALCITON  --   --  0.47  --   --   --   --   --   --   --   --   --   --   --   DDIMER  --    < > 1.76*  --   --  1.71*   < >  --   --  2.42* 2.80* 3.77* 12.75* >20.00*  FERRITIN  --   --   --  577*  --  574*  --   --   --  547*  --   --   --   --   LDH 457*  --   --   --   --   --   --   --   --   --   --   --   --   --   CRP  --    < >  --  11.8*  --  8.6*  --   --   --  6.4* 4.1* 3.0* 2.0*  --   ALT 25   < >  --   --   --  24   < >  --   --  28 29 33 32 33   < > = values in this interval not displayed.    The treatment plan and use of medications and known side effects were discussed with patient/family. Some of the medications used are based on case reports/anecdotal data.  All other medications being used in the management of COVID-19 based on limited study data.  Complete risks and long-term side effects are unknown, however in the best clinical  judgment they seem to be of some benefit.  Patient wanted to proceed with treatment options provided.  Acute right MCA stroke  -8/24, noted to have left hemiparesis.   -CT scan showed dense right MCA stroke.  -Neurology consultation obtained.  High risk of bleeding with thrombectomy.  Also, not a candidate for further stroke studies because of dependence on high flow oxygen.  Family decided to not pursue stroke work-up. -Currently getting rectal aspirin. -PT/OT/ST evaluation but again limited because of altered mental status and high flow oxygen requirement.   Acute metabolic encephalopathy -Secondary to COVID-19 as well as a stroke.  -Very slim chance of recovery.  -Continue to monitor mental status.   AKI on CKD 3B  Metabolic acidosis  -Baseline creatinine 1.5-1.6.  Presented with creatinine of 4.29.   -Likely secondary to poor oral intake.  Adequately hydrated.  Creatinine improving.   -Renal ultrasound did not show any hydronephrosis.  Recent Labs    01/17/19 1104 06/20/19 1149 12-01-19 1915 11/17/19 0525 11/18/19 0500 11/19/19 0206 11/20/19 0146 11/21/19 0233 11/22/19 0157  CREATININE 1.29* 1.68* 4.29* 4.42* 2.75* 1.88* 1.66* 1.37* 1.47*   Essential hypertension Amlodipine and lisinopril on hold because of acute stroke  Lactic acidosis Secondary to hypovolemia.  No evidence for sepsis.  Improved with IV hydration.    History of cough variant asthma Albuterol as needed.  History of hyperlipidemia Continue statin.  AST noted to be mildly elevated..  Mildly elevated troponin Most likely due to demand ischemia from acute illness.  EKG showed nonspecific T wave changes.  Patient denies any chest pain.  Echocardiogram shows normal systolic function.  Right ventricle was also normal.  No further cardiac work-up at this time.   Bacteremia Blood culture with staph epidermidis.  She is afebrile.  This is most likely a contaminant.  No need to add antibiotics at this  time.  QT prolongation Avoid QT prolonging medication as much as possible.  Subsequent EKG showed normal QT interval.  History of peripheral vascular disease Appears to be stable.  Questionable psychiatric history Patient noted to be on Abilify as well as Topamax.  Also noted to be on Cymbalta.    Mobility: Impaired because of stroke and encephalopathy Code Status:   Code Status: DNR  Nutritional status: Body mass index is  32.94 kg/m. Nutrition Problem: Inadequate oral intake Etiology: acute illness, poor appetite, nausea (acute respiratory failure; pneumonia secondary to COVID-19) Signs/Symptoms: per patient/family report Diet Order            Diet 2 gram sodium Room service appropriate? Yes; Fluid consistency: Thin  Diet effective now                 DVT prophylaxis: enoxaparin (LOVENOX) injection 40 mg Start: 11/20/19 2200   Antimicrobials:  Fluid: None  Consultants: None Family Communication: Called and updated patient's daughter Ms. Marcelino Duster this afternoon.  She understands the seriousness of patient's condition and poor prognosis.  In next 24 hours, if her condition does not improve, she is open to changing her to comfort care status.    Status is: Inpatient  Remains inpatient appropriate because:Ongoing diagnostic testing needed not appropriate for outpatient work up and IV treatments appropriate due to intensity of illness or inability to take PO   Dispo:  Patient From: Home  Planned Disposition: To be determined  Expected discharge date: 11/24/19  Medically stable for discharge: No        Infusions:    Scheduled Meds: . aspirin  300 mg Rectal Daily  . chlorhexidine  15 mL Mouth Rinse BID  . Chlorhexidine Gluconate Cloth  6 each Topical Daily  . Chlorhexidine Gluconate Cloth  6 each Topical Q0600  . enoxaparin (LOVENOX) injection  40 mg Subcutaneous Q24H  . mouth rinse  15 mL Mouth Rinse q12n4p  . methylPREDNISolone (SOLU-MEDROL) injection   40 mg Intravenous Q12H  . mupirocin ointment  1 application Nasal BID    Antimicrobials: Anti-infectives (From admission, onward)   Start     Dose/Rate Route Frequency Ordered Stop   11/17/19 1200  doxycycline (VIBRA-TABS) tablet 100 mg  Status:  Discontinued        100 mg Oral Every 12 hours 11/17/19 1117 11/21/19 1251   11/17/19 1000  remdesivir 100 mg in sodium chloride 0.9 % 100 mL IVPB  Status:  Discontinued       "Followed by" Linked Group Details   100 mg 200 mL/hr over 30 Minutes Intravenous Daily 12-08-19 2253 12/08/2019 2256   11/17/19 1000  remdesivir 100 mg in sodium chloride 0.9 % 100 mL IVPB       "Followed by" Linked Group Details   100 mg 200 mL/hr over 30 Minutes Intravenous Daily 12-08-2019 2147 11/20/19 1108   08-Dec-2019 2253  remdesivir 200 mg in sodium chloride 0.9% 250 mL IVPB  Status:  Discontinued       "Followed by" Linked Group Details   200 mg 580 mL/hr over 30 Minutes Intravenous Once 2019/12/08 2253 2019/12/08 2256   12/08/19 2200  remdesivir 200 mg in sodium chloride 0.9% 250 mL IVPB       "Followed by" Linked Group Details   200 mg 580 mL/hr over 30 Minutes Intravenous Once 12/08/19 2147 December 08, 2019 2316      PRN meds: acetaminophen, albuterol, haloperidol lactate, hydrALAZINE, morphine injection, ondansetron (ZOFRAN) IV, phenol   Objective: Vitals:   11/22/19 1300 11/22/19 1526  BP: (!) 166/64   Pulse: (!) 50 82  Resp: 20 18  Temp:    SpO2: 98% 97%    Intake/Output Summary (Last 24 hours) at 11/22/2019 1613 Last data filed at 11/22/2019 0400 Gross per 24 hour  Intake --  Output 950 ml  Net -950 ml   Filed Weights   11/18/19 1352 11/21/19 0704 11/22/19 0457  Weight: 82.4 kg  80.5 kg 81.7 kg   Weight change:  Body mass index is 32.94 kg/m.   Physical Exam: General exam: Lethargic, moaning Skin: No rashes, lesions or ulcers. HEENT: Atraumatic, normocephalic, no obvious bleeding Lungs: Diminished respiratory effort CVS: Regular rate and rhythm,  no murmur GI/Abd soft, nontender, nondistended, bowel sound present CNS: Tries to open eyes on sternal rub Psychiatry: Unable to examine because of altered mentation Extremities: No pedal edema, no calf tenderness  Data Review: I have personally reviewed the laboratory data and studies available.  Recent Labs  Lab 11/17/19 0525 11/17/19 0525 11/18/19 0500 11/19/19 0206 11/20/19 0146 11/21/19 0233 11/22/19 0157  WBC 11.6*   < > 10.5 10.4 14.3* 19.3* 16.3*  NEUTROABS 9.7*  --  8.6* 8.9* 12.9* 17.5*  --   HGB 12.6   < > 12.9 12.6 12.9 13.6 13.4  HCT 39.3   < > 39.3 40.1 40.8 42.9 41.2  MCV 94.2   < > 92.3 93.7 94.2 94.3 92.6  PLT 260   < > 275 295 313 223 226   < > = values in this interval not displayed.   Recent Labs  Lab 11/17/19 0525 11/17/19 0525 11/18/19 0500 11/19/19 0206 11/20/19 0146 11/21/19 0233 11/22/19 0157  NA 139   < > 142 142 143 140 146*  K 4.2   < > 4.1 3.8 3.8 5.0 4.0  CL 103   < > 109 113* 113* 109 113*  CO2 18*   < > 20* 18* 17* 19* 20*  GLUCOSE 127*   < > 130* 150* 146* 170* 195*  BUN 63*   < > 66* 64* 61* 59* 60*  CREATININE 4.42*   < > 2.75* 1.88* 1.66* 1.37* 1.47*  CALCIUM 8.4*   < > 8.0* 8.2* 8.4* 8.6* 8.7*  MG 1.9  --  1.8 1.8 1.9 2.3  --    < > = values in this interval not displayed.   Lab Results  Component Value Date   HGBA1C 5.4 05/22/2019       Component Value Date/Time   CHOL 88 11/22/2019 0157   TRIG 141 11/22/2019 0157   HDL 26 (L) 11/22/2019 0157   CHOLHDL 3.4 11/22/2019 0157   VLDL 28 11/22/2019 0157   LDLCALC 34 11/22/2019 0157   LDLCALC 98 07/13/2018 1002    Signed, Lorin GlassBinaya Vladislav Axelson, MD [facility] Pager: [phone] (Secure Chat preferred). 11/22/2019

## 2019-11-22 NOTE — Evaluation (Signed)
SLP Cancellation Note  Patient Details Name: Tracy Massey MRN: [id number] DOB: [birth year]   Cancelled treatment:       Reason Eval/Treat Not Completed: Other (comment) (pt not medically appropriate for swallow evaluation yet, will continue efforts)   Chales Abrahams 11/22/2019, 2:58 PM  Rolena Infante, MS [facility] SLP Acute Rehab Services Office [phone]

## 2019-11-23 LAB — CBC
HCT: 42.1 % (ref 36.0–46.0)
Hemoglobin: 13.2 g/dL (ref 12.0–15.0)
MCH: 29.8 pg (ref 26.0–34.0)
MCHC: 31.4 g/dL (ref 30.0–36.0)
MCV: 95 fL (ref 80.0–100.0)
Platelets: 180 10*3/uL (ref 150–400)
RBC: 4.43 MIL/uL (ref 3.87–5.11)
RDW: 14.4 % (ref 11.5–15.5)
WBC: 18.2 10*3/uL — ABNORMAL HIGH (ref 4.0–10.5)
nRBC: 0.1 % (ref 0.0–0.2)

## 2019-11-23 LAB — COMPREHENSIVE METABOLIC PANEL
ALT: 25 U/L (ref 0–44)
AST: 37 U/L (ref 15–41)
Albumin: 2.9 g/dL — ABNORMAL LOW (ref 3.5–5.0)
Alkaline Phosphatase: 93 U/L (ref 38–126)
Anion gap: 11 (ref 5–15)
BUN: 65 mg/dL — ABNORMAL HIGH (ref 8–23)
CO2: 21 mmol/L — ABNORMAL LOW (ref 22–32)
Calcium: 8.6 mg/dL — ABNORMAL LOW (ref 8.9–10.3)
Chloride: 117 mmol/L — ABNORMAL HIGH (ref 98–111)
Creatinine, Ser: 1.42 mg/dL — ABNORMAL HIGH (ref 0.44–1.00)
GFR calc Af Amer: 43 mL/min — ABNORMAL LOW (ref >60)
GFR calc non Af Amer: 37 mL/min — ABNORMAL LOW (ref >60)
Glucose, Bld: 178 mg/dL — ABNORMAL HIGH (ref 70–99)
Potassium: 5.2 mmol/L — ABNORMAL HIGH (ref 3.5–5.1)
Sodium: 149 mmol/L — ABNORMAL HIGH (ref 135–145)
Total Bilirubin: 1.4 mg/dL — ABNORMAL HIGH (ref 0.3–1.2)
Total Protein: 6.3 g/dL — ABNORMAL LOW (ref 6.5–8.1)

## 2019-11-23 LAB — D-DIMER, QUANTITATIVE: D-Dimer, Quant: 16.15 ug/mL-FEU — ABNORMAL HIGH (ref 0.00–0.50)

## 2019-11-23 LAB — HEMOGLOBIN A1C
Hgb A1c MFr Bld: 6.5 % — ABNORMAL HIGH (ref 4.8–5.6)
Mean Plasma Glucose: 140 mg/dL

## 2019-11-23 MED ORDER — MORPHINE SULFATE (PF) 2 MG/ML IV SOLN
2.0000 mg | INTRAVENOUS | Status: DC | PRN
  Administered 2019-11-23 (×2): 2 mg via INTRAVENOUS
  Filled 2019-11-23 (×3): qty 1

## 2019-11-23 MED ORDER — LORAZEPAM 2 MG/ML IJ SOLN
1.0000 mg | INTRAMUSCULAR | Status: DC
  Administered 2019-11-23: 1 mg via INTRAVENOUS
  Filled 2019-11-23: qty 1

## 2019-11-23 MED ORDER — MORPHINE 100MG IN NS 100ML (1MG/ML) PREMIX INFUSION
3.0000 mg/h | INTRAVENOUS | Status: DC
  Administered 2019-11-23: 1 mg/h via INTRAVENOUS
  Filled 2019-11-23: qty 100

## 2019-11-23 MED ORDER — LORAZEPAM 2 MG/ML IJ SOLN
1.0000 mg | INTRAMUSCULAR | Status: DC | PRN
  Administered 2019-11-23 (×2): 1 mg via INTRAVENOUS
  Filled 2019-11-23 (×2): qty 1

## 2019-11-29 NOTE — TOC Progression Note (Signed)
Transition of Care [facility]) - Progression Note    Patient Details  Name: Tracy Massey MRN: [id number] Date of Birth: [birth year]  [facility] CM/SW Contact  Golda Acre, RN Phone Number: 20-Dec-2019, 8:11 AM  Clinical Narrative:    Remains on hfnrb at 35l/min, desats to 87%. R cva on [id number], covid positive patient unvaccinated, now moaning but responds only slightly to sternal rub, Iv solumedrol, D.Dimer 16.15, Na-149, K+ 5.2, bun-65/creat=1.42, wbc 18.2, patient has been made a DNR by the family. Following for progression. Hospital death vs snf long term placment vs home .   Expected Discharge Plan: Home/Self Care Barriers to Discharge: Continued Medical Work up  Expected Discharge Plan and Services Expected Discharge Plan: Home/Self Care   Discharge Planning Services: CM Consult   Living arrangements for the past 2 months: Single Family Home                                       Social Determinants of Health (SDOH) Interventions    Readmission Risk Interventions No flowsheet data found.

## 2019-11-29 NOTE — Death Summary Note (Signed)
DEATH SUMMARY   Patient Details  Name: Tracy Massey MRN: [id number] DOB: 09-12-1950  Admission/Discharge Information   Admit Date:  01-Dec-2019  Date of Death: Date of Death: 12-08-2019  Time of Death: Time of Death: 2135/07/29  Length of Stay: 7  Referring Physician: Agapito Games, MD   Reason(s) for Hospitalization   COVID-19 pneumonia.  Diagnoses  Preliminary cause of death:  Secondary Diagnoses (including complications and co-morbidities):  Active Problems:   Hyperlipidemia   Essential hypertension, benign   Cough variant asthma   GERD   CKD stage G3b/A2, GFR 30-44 and albumin creatinine ratio 30-299 mg/g   Peripheral vascular disease, unspecified (HCC)   Obesity, Class I, BMI 30-34.9   Acute respiratory failure with hypoxia (HCC)   AKI (acute kidney injury) (HCC)   Pneumonia due to COVID-19 virus   Elevated troponin   Lactic acid acidosis   Prolonged QT interval   Brief Hospital Course (including significant findings, care, treatment, and services provided and events leading to death)  Tracy Massey is a 69 y.o. female with PMH of HTN, HLD, asthma, PVD. Patient presented to the ED on 2019-12-01 with complaint of shortness of breath and altered mental status. 8/16, patient was diagnosed with Covid positive as an outpatient.  She had shortness of breath for 3 days at the time. Apparently she was on the way to the infusion center to get monoclonal antibodies when she got confused and got lost in the parking lot. She was noted to be cyanotic, hypoxic and hypotensive.  She was brought to the ED. In the ED, she required 15 L oxygen to maintain oxygen saturation. Chest x-ray showed patchy bilateral lung opacities. She was admitted for Covid pneumonia.  Unfortunately, on the morning of 8/24, patient was noted to have dense hemiparesis on the left side.  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 Hyperdense right MCA compatible with acute thrombus. No acute hemorrhage. Seen by neurology.  Based on her high risk of bleeding, thrombectomy was not performed. Family decided to make her DNR and not pursue further aggressive investigation and management for stroke.  As per discussion with patient's family, she was monitored for next 24 to 48 hours. No significant improvement from respiratory or neurological standpoint. Per family's decision, patient was made comfort care. Patient expired at 21:37 on 12-08-22.  Pertinent Labs and Studies  Significant Diagnostic Studies US RENAL  Result Date: 11/17/2019 CLINICAL DATA:  69 year old female with acute renal injury. EXAM: RENAL / URINARY TRACT ULTRASOUND COMPLETE COMPARISON:  Renal ultrasound 07/25/2018. FINDINGS: Right Kidney: Renal measurements: 8.7 x 5.2 x 4.7 cm = volume: 106 mL. Cortical echogenicity within normal limits. Stable cortical thickness. No right hydronephrosis or renal lesion. Left Kidney: Renal measurements: 8.0 x 7.3 x 4.4 cm = volume: 128 mL. Cortical echogenicity within normal limits. Stable cortical thickness. Benign appearing 2 cm left renal midpole cyst redemonstrated (image 14). No hydronephrosis or other left renal lesion. Bladder: Appears normal for degree of bladder distention. Other: None. IMPRESSION: 1. No acute renal findings.  Normal bladder. 2. Stable since last year and largely unremarkable for age ultrasound appearance of both kidneys. Electronically Signed   By: Odessa Fleming M.D.   On: 11/17/2019 10:30   DG Chest Port 1 View  Result Date: 11/21/2019 CLINICAL DATA:  Pneumonia, COVID-19 EXAM: PORTABLE CHEST 1 VIEW COMPARISON:  Portable exam 0441 hours compared to 11/19/2019 FINDINGS: Normal heart  size, mediastinal contours, and pulmonary vascularity. Normal heart size, mediastinal contours, and pulmonary vascularity. Bibasilar pulmonary infiltrates consistent with multifocal pneumonia. No pleural effusion  or pneumothorax. Bones demineralized. IMPRESSION: Persistent BILATERAL pulmonary infiltrates consistent with multifocal pneumonia and history of COVID-19 . Electronically Signed   By: Ulyses Southward M.D.   On: 11/21/2019 07:10   DG Chest Port 1 View  Result Date: 11/19/2019 CLINICAL DATA:  69 year old female with history of pneumonia from COVID infection. EXAM: PORTABLE CHEST 1 VIEW COMPARISON:  Chest x-ray 11/13/2019. FINDINGS: Patchy areas of interstitial prominence an ill-defined airspace disease noted throughout the mid to lower lungs bilaterally, overall with slightly worsened aeration compared to the prior examination. No pleural effusions. No evidence of pulmonary edema. Heart size is normal. Upper mediastinal contours are within normal limits. Aortic atherosclerosis. IMPRESSION: 1. Slight worsening of multilobar bilateral pneumonia compatible with reported COVID infection, as above. 2. Aortic atherosclerosis. Electronically Signed   By: Trudie Reed M.D.   On: 11/19/2019 06:04   DG Chest Port 1 View  Result Date: 10/31/2019 CLINICAL DATA:  Shortness of breath. Confusion. COVID positive 3 days ago. EXAM: PORTABLE CHEST 1 VIEW COMPARISON:  Radiograph 07/17/2019 FINDINGS: Patchy bilateral lung opacities in a mid lower lung zone predominant distribution. Heart is normal in size with normal mediastinal contours. No pneumothorax, pleural effusion, or evidence of pulmonary edema. No acute osseous abnormalities are seen. IMPRESSION: Patchy bilateral lung opacities in a pattern consistent with COVID-19 pneumonia. Electronically Signed   By: Narda Rutherford M.D.   On: Nov 23, 2019 20:04   CT HEAD CODE STROKE WO CONTRAST`  Addendum Date: 11/21/2019   ADDENDUM REPORT: 11/21/2019 11:01 ADDENDUM: These results were called by telephone at the time of interpretation on 11/21/2019 at 11:00 am to provider [facility] , who verbally acknowledged these results. Electronically Signed   By: Marlan Palau M.D.   On:  11/21/2019 11:01   Result Date: 11/21/2019 CLINICAL DATA:  Code stroke.  Mental status change. EXAM: CT HEAD WITHOUT CONTRAST TECHNIQUE: Contiguous axial images were obtained from the base of the skull through the vertex without intravenous contrast. COMPARISON:  None. FINDINGS: Brain: Hypodensity throughout much of the right MCA territory compatible with acute infarct. This involves portions of the basal ganglia, insula as well as the right frontal and temporal lobe. No associated hemorrhage. Ventricle size normal. No midline shift. No other acute infarct, hemorrhage, or mass. Vascular: Hyperdense right MCA compatible with thrombosis. Skull: Negative Sinuses/Orbits: Air-fluid level right maxillary sinus. Remaining sinuses clear. Negative orbit. Other: None ASPECTS (Alberta Stroke Program Early CT Score) - Ganglionic level infarction (caudate, lentiform nuclei, internal capsule, insula, M1-M3 cortex): 1 - Supraganglionic infarction (M4-M6 cortex): 0 Total score (0-10 with 10 being normal): 1 IMPRESSION: 1. 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 Hyperdense right MCA compatible with acute thrombus. No acute hemorrhage 2. ASPECTS is 1 Electronically Signed: By: Marlan Palau M.D. On: 11/21/2019 10:52   ECHOCARDIOGRAM LIMITED  Result Date: 11/17/2019    ECHOCARDIOGRAM LIMITED REPORT   Patient Name:   SHALINDA BURKHOLDER Date of Exam: 11/17/2019 Medical Rec #:  [id number]           Height:       63.0 in Accession #:    [id number]          Weight:       185.2 lb Date of Birth:  December 18, 1950           BSA:  1.871 m Patient Age:    68 years            BP:           169/96 mmHg Patient Gender: F                   HR:           71 bpm. Exam Location:  Inpatient Procedure: Limited Echo, Cardiac Doppler, Color Doppler and Intracardiac            Opacification Agent Indications:    Elevated troponin, Abnormal EKG  History:        Patient has no prior  history of Echocardiogram examinations.                 PVD, Arrythmias:Abnormal EKG, Signs/Symptoms:Shortness of                 Breath; Risk Factors:Hypertension and Dyslipidemia. COVID +.  Sonographer:    Lavenia AtlasBrooke Strickland Referring Phys: [phone] [facility] LLC[facility]OKUL KRISHNAN  Sonographer Comments: Patient is morbidly obese. Image acquisition challenging due to respiratory motion and Image acquisition challenging due to patient body habitus. IMPRESSIONS  1. Left ventricular ejection fraction, by estimation, is 60 to 65%. The left ventricle has normal function. There is mild left ventricular hypertrophy. Left ventricular diastolic parameters are consistent with Grade I diastolic dysfunction (impaired relaxation).  2. Right ventricular systolic function is normal. The right ventricular size is normal. There is normal pulmonary artery systolic pressure. The estimated right ventricular systolic pressure is 34.8 mmHg.  3. Left atrial size was mildly dilated.  4. The mitral valve is grossly normal. Mild mitral valve regurgitation.  5. The aortic valve is tricuspid. Aortic valve regurgitation is not visualized. No aortic stenosis is present.  6. The inferior vena cava is normal in size with greater than 50% respiratory variability, suggesting right atrial pressure of 3 mmHg. FINDINGS  Left Ventricle: Left ventricular ejection fraction, by estimation, is 60 to 65%. The left ventricle has normal function. There is mild left ventricular hypertrophy. Right Ventricle: The right ventricular size is normal. Right ventricular systolic function is normal. There is normal pulmonary artery systolic pressure. The tricuspid regurgitant velocity is 2.82 m/s, and with an assumed right atrial pressure of 3 mmHg,  the estimated right ventricular systolic pressure is 34.8 mmHg. Left Atrium: Left atrial size was mildly dilated. Right Atrium: Right atrial size was normal in size. Mitral Valve: The mitral valve is grossly normal. Mild mitral annular  calcification. Mild mitral valve regurgitation. Tricuspid Valve: The tricuspid valve is grossly normal. Tricuspid valve regurgitation is trivial. Aortic Valve: The aortic valve is tricuspid. Aortic valve regurgitation is not visualized. No aortic stenosis is present. Pulmonic Valve: The pulmonic valve was grossly normal. Pulmonic valve regurgitation is mild. Venous: The inferior vena cava was not well visualized. The inferior vena cava is normal in size with greater than 50% respiratory variability, suggesting right atrial pressure of 3 mmHg. LEFT VENTRICLE PLAX 2D LVIDd:         4.20 cm  Diastology LVIDs:         3.40 cm  LV e' lateral:   8.05 cm/s LV PW:         1.10 cm  LV E/e' lateral: 6.1 LV IVS:        1.20 cm  LV e' medial:    5.33 cm/s LVOT diam:     2.10 cm  LV E/e' medial:  9.2 LVOT Area:  3.46 cm  LEFT ATRIUM         Index LA diam:    3.70 cm 1.98 cm/m   AORTA Ao Root diam: 2.70 cm MITRAL VALVE               TRICUSPID VALVE MV Area (PHT): 3.60 cm    TR Peak grad:   31.8 mmHg MV Decel Time: 211 msec    TR Vmax:        282.00 cm/s MV E velocity: 49.30 cm/s MV A velocity: 81.80 cm/s  SHUNTS MV E/A ratio:  0.60        Systemic Diam: 2.10 cm Weston Brass MD Electronically signed by Weston Brass MD Signature Date/Time: 11/17/2019/4:43:01 PM    Final     Microbiology Recent Results (from the past 240 hour(s))  Blood Culture (routine x 2)     Status: Abnormal   Collection Time: 2019/12/08  8:25 PM   Specimen: BLOOD  Result Value Ref Range Status   Specimen Description   Final    BLOOD RIGHT ANTECUBITAL Performed at [facility], [address] [place], [place] [address]    Special Requests   Final    BOTTLES DRAWN AEROBIC AND ANAEROBIC Blood Culture adequate volume Performed at [facility], [address] [place], [place] [address]    Culture  Setup Time   Final    ANAEROBIC BOTTLE ONLY GRAM POSITIVE COCCI Organism ID to follow CRITICAL RESULT  CALLED TO, READ BACK BY AND VERIFIED WITH: Damaris Hippo [facility] 11/18/19/0330 JDW    Culture (A)  Final    STAPHYLOCOCCUS EPIDERMIDIS THE SIGNIFICANCE OF ISOLATING THIS ORGANISM FROM A SINGLE SET OF BLOOD CULTURES WHEN MULTIPLE SETS ARE DRAWN IS UNCERTAIN. PLEASE NOTIFY THE MICROBIOLOGY DEPARTMENT WITHIN ONE WEEK IF SPECIATION AND SENSITIVITIES ARE REQUIRED. Performed at [facility], [address] [place], [place] [address]    Report Status 11/20/2019 FINAL  Final  Blood Culture (routine x 2)     Status: None   Collection Time: 2019/12/08  8:25 PM   Specimen: BLOOD  Result Value Ref Range Status   Specimen Description   Final    BLOOD RIGHT ANTECUBITAL Performed at [facility], [address], [place], [place] [address]    Special Requests   Final    BOTTLES DRAWN AEROBIC AND ANAEROBIC Blood Culture adequate volume Performed at [facility], [address], [place], [place] [address]    Culture   Final    NO GROWTH 5 DAYS Performed at [facility], [address] [place], [place] [address]    Report Status 11/22/2019 FINAL  Final  Blood Culture ID Panel (Reflexed)     Status: Abnormal   Collection Time: 2019-12-08  8:25 PM  Result Value Ref Range Status   Enterococcus faecalis NOT DETECTED NOT DETECTED Final   Enterococcus Faecium NOT DETECTED NOT DETECTED Final   Listeria monocytogenes NOT DETECTED NOT DETECTED Final   Staphylococcus species DETECTED (A) NOT DETECTED Final    Comment: CRITICAL RESULT CALLED TO, READ BACK BY AND VERIFIED WITH: Damaris Hippo [facility] 11/18/19 0330 JDW    Staphylococcus aureus (BCID) NOT DETECTED NOT DETECTED Final   Staphylococcus epidermidis DETECTED (A) NOT DETECTED Final    Comment: Methicillin (oxacillin) resistant coagulase negative staphylococcus. Possible blood culture contaminant (unless isolated from more than one blood culture draw or clinical case suggests pathogenicity). No antibiotic  treatment is indicated for blood  culture contaminants. CRITICAL RESULT CALLED TO, READ BACK BY AND VERIFIED WITH: Damaris Hippo [facility] 11/18/19 0330 JDW    Staphylococcus lugdunensis NOT DETECTED NOT DETECTED Final   Streptococcus species NOT DETECTED NOT DETECTED Final   Streptococcus agalactiae NOT DETECTED NOT DETECTED Final   Streptococcus pneumoniae NOT DETECTED NOT DETECTED Final   Streptococcus pyogenes NOT DETECTED NOT DETECTED Final   A.calcoaceticus-baumannii NOT DETECTED NOT DETECTED Final   Bacteroides fragilis NOT DETECTED NOT DETECTED Final   Enterobacterales NOT DETECTED NOT DETECTED Final   Enterobacter cloacae complex NOT DETECTED NOT DETECTED Final   Escherichia coli NOT DETECTED NOT DETECTED Final   Klebsiella aerogenes NOT DETECTED NOT DETECTED Final   Klebsiella oxytoca NOT DETECTED NOT DETECTED Final   Klebsiella pneumoniae NOT DETECTED NOT DETECTED Final   Proteus species NOT DETECTED NOT DETECTED Final   Salmonella species NOT DETECTED NOT DETECTED Final   Serratia marcescens NOT DETECTED NOT DETECTED Final   Haemophilus influenzae NOT DETECTED NOT DETECTED Final   Neisseria meningitidis NOT DETECTED NOT DETECTED Final   Pseudomonas aeruginosa NOT DETECTED NOT DETECTED Final   Stenotrophomonas maltophilia NOT DETECTED NOT DETECTED Final   Candida albicans NOT DETECTED NOT DETECTED Final   Candida auris NOT DETECTED NOT DETECTED Final   Candida glabrata NOT DETECTED NOT DETECTED Final   Candida krusei NOT DETECTED NOT DETECTED Final   Candida parapsilosis NOT DETECTED NOT DETECTED Final   Candida tropicalis NOT DETECTED NOT DETECTED Final   Cryptococcus neoformans/gattii NOT DETECTED NOT DETECTED Final   Methicillin resistance mecA/C DETECTED (A) NOT DETECTED Final    Comment: CRITICAL RESULT CALLED TO, READ BACK BY AND VERIFIED WITH: Damaris Hippo [facility] 11/18/19 0330 JDW Performed at [facility], [address], [place], [place] [address]   MRSA PCR  Screening     Status: Abnormal   Collection Time: 11/18/19  1:52 PM   Specimen: Nasal Mucosa; Nasopharyngeal  Result Value Ref Range Status   MRSA by PCR POSITIVE (A) NEGATIVE Final    Comment:        The GeneXpert MRSA Assay (FDA approved for NASAL specimens only), is one component of a comprehensive MRSA colonization surveillance program. It is not intended to diagnose MRSA infection nor to guide or monitor treatment for MRSA infections. RESULT CALLED TO, READ BACK BY AND VERIFIED WITH: E.HEAVNER,RN [id number]  BY V.WILKINS Performed at [facility], [address], [place], [place] [address]     Lab Basic Metabolic Panel: Recent Labs  Lab 11/18/19 0500 11/18/19 0500 11/19/19 0206 11/20/19 0146 11/21/19 0233 11/22/19 0157 11/20/2019 0205  NA 142   < > 142 143 140 146* 149*  K 4.1   < > 3.8 3.8 5.0 4.0 5.2*  CL 109   < > 113* 113* 109 113* 117*  CO2 20*   < > 18* 17* 19* 20* 21*  GLUCOSE 130*   < > 150* 146* 170* 195* 178*  BUN 66*   < > 64* 61* 59* 60* 65*  CREATININE 2.75*   < > 1.88* 1.66* 1.37* 1.47* 1.42*  CALCIUM 8.0*   < > 8.2* 8.4* 8.6* 8.7* 8.6*  MG 1.8  --  1.8 1.9 2.3  --   --    < > = values in this interval not displayed.   Liver Function Tests: Recent Labs  Lab 11/19/19 0206 11/20/19 0146 11/21/19 0233 11/22/19 0157 11/15/2019 0205  AST 47* 46* 42* 38 37  ALT 29 33 32 33  25  ALKPHOS 77 82 92 94 93  BILITOT 0.6 1.0 0.7 1.0 1.4*  PROT 6.2* 6.3* 6.5 6.5 6.3*  ALBUMIN 2.6* 2.7* 2.8* 3.1* 2.9*   No results for input(s): LIPASE, AMYLASE in the last 168 hours. No results for input(s): AMMONIA in the last 168 hours. CBC: Recent Labs  Lab 11/18/19 0500 11/18/19 0500 11/19/19 0206 11/20/19 0146 11/21/19 0233 11/22/19 0157 11/25/2019 0205  WBC 10.5   < > 10.4 14.3* 19.3* 16.3* 18.2*  NEUTROABS 8.6*  --  8.9* 12.9* 17.5*  --   --   HGB 12.9   < > 12.6 12.9 13.6 13.4 13.2  HCT 39.3   < > 40.1 40.8 42.9 41.2 42.1  MCV 92.3   < >  93.7 94.2 94.3 92.6 95.0  PLT 275   < > 295 313 223 226 180   < > = values in this interval not displayed.   Cardiac Enzymes: No results for input(s): CKTOTAL, CKMB, CKMBINDEX, TROPONINI in the last 168 hours. Sepsis Labs: Recent Labs  Lab 11/20/19 0146 11/21/19 0233 11/22/19 0157 11/11/2019 0205  WBC 14.3* 19.3* 16.3* 18.2*    Procedures/Operations     Jacora Hopkins 11/24/2019, 3:53 PM

## 2019-11-29 NOTE — Progress Notes (Signed)
PROGRESS NOTE  Tracy Massey  DOB: [birth year]  PCP: Tracy Games, MD DZH:[id number]  DOA: 11/12/2019  LOS: 7 days   Chief Complaint  Patient presents with  . Shortness of Breath    Brief narrative: Tracy Massey is a 69 y.o. female with PMH of HTN, HLD, asthma, PVD. Patient presented to the ED on 10/31/2019 with complaint of shortness of breath and altered mental status. 8/16, patient was diagnosed with Covid positive as an outpatient.  She had shortness of breath for 3 days at the time. Apparently she was on the way to the infusion center to get monoclonal antibodies when she got confused and got lost in the parking lot. She was noted to be cyanotic, hypoxic and hypotensive.  She was brought to the ED. In the ED, she required 15 L oxygen to maintain oxygen saturation. Chest x-ray showed patchy bilateral lung opacities. She was admitted for Covid pneumonia.  Unfortunately, on the morning of 8/24, patient was noted to have dense hemiparesis on the left side.  CT scan of head showed large territory acute infarct right MCA territory with diffuse hypodensity seen throughout the infarct. Hyperdense right MCA compatible with acute thrombus. No acute hemorrhage. Seen by neurology.  Based on her high risk of bleeding, thrombectomy was not performed. Family decided to make her DNR and not pursue further aggressive investigation and management for stroke.  8/26, I discussed with patient's daughter again.  Patient has not had any improvement from respiratory over neurological standpoint in last 48 hours.  She is moaning most of the times and really looks uncomfortable. Patient's daughter chose comfort care measures. Visitors to be allowed as per policy.  Subjective: Patient was seen and examined this morning. Morning.  Remains on high flow oxygen by nasal cannula  Assessment/Plan: COVID pneumonia Acute respiratory failure with hypoxia  -Presented with worsening COVID  symptoms, hypoxia -COVID test: PCR positive on 8/16 -Chest imaging: Chest x-ray on admission with patchy bilateral lung space opacities -Treatment: Completed course of IV remdesivir on 8/23.  Currently remains on IV Solu-Medrol 40 mg twice daily.  She received a dose of Actemra on 8/19.  Because of elevated procalcitonin level, she also received a course of oral doxycycline. -Despite standard treatment, patient continues to remain on high flow oxygen currently requiring 30 L/min. -As per decision by family, comfort care measures to be initiated today. -Okay to switch her to low-flow oxygen  Acute right MCA stroke  -8/24, noted to have left hemiparesis.   -CT scan showed dense right MCA stroke.  -Neurology consultation obtained.  High risk of bleeding with thrombectomy.  Also, not a candidate for further stroke studies because of dependence on high flow oxygen.  Family decided to not pursue stroke work-up. -Currently getting rectal aspirin. -PT/OT/ST evaluation but again limited because of altered mental status and high flow oxygen requirement.  -No improvement in stroke symptoms in last 48 hours.  Acute metabolic encephalopathy -Secondary to COVID-19 as well as a stroke.  -Very slim chance of recovery.   AKI on CKD 3B  Metabolic acidosis  -Baseline creatinine 1.5-1.6.  Presented with creatinine of 4.29.   -Likely secondary to poor oral intake.   -Creatinine is improving but overall deteriorating medical condition because of Covid pneumonia and stroke.    Comfort care measures to initiated.  Family Communication: Called and updated patient's daughter Ms. Tracy Massey this afternoon.  She understands the seriousness of patient's condition and poor prognosis.  Comfort care measures initiated  Status is: Inpatient  Remains inpatient appropriate because -patient is complicated status  Dispo:  Patient From: Home  Planned Disposition: Unlikely to survive this hospitalization.       Infusions:    Scheduled Meds: . aspirin  300 mg Rectal Daily  . chlorhexidine  15 mL Mouth Rinse BID  . Chlorhexidine Gluconate Cloth  6 each Topical Daily  . Chlorhexidine Gluconate Cloth  6 each Topical Q0600  . enoxaparin (LOVENOX) injection  40 mg Subcutaneous Q24H  . mouth rinse  15 mL Mouth Rinse q12n4p  . methylPREDNISolone (SOLU-MEDROL) injection  40 mg Intravenous Q12H    Antimicrobials: Anti-infectives (From admission, onward)   Start     Dose/Rate Route Frequency Ordered Stop   11/17/19 1200  doxycycline (VIBRA-TABS) tablet 100 mg  Status:  Discontinued        100 mg Oral Every 12 hours 11/17/19 1117 11/21/19 1251   11/17/19 1000  remdesivir 100 mg in sodium chloride 0.9 % 100 mL IVPB  Status:  Discontinued       "Followed by" Linked Group Details   100 mg 200 mL/hr over 30 Minutes Intravenous Daily 11/06/2019 2253 11/28/2019 2256   11/17/19 1000  remdesivir 100 mg in sodium chloride 0.9 % 100 mL IVPB       "Followed by" Linked Group Details   100 mg 200 mL/hr over 30 Minutes Intravenous Daily 11/16/2019 2147 11/20/19 1108   11/22/2019 2253  remdesivir 200 mg in sodium chloride 0.9% 250 mL IVPB  Status:  Discontinued       "Followed by" Linked Group Details   200 mg 580 mL/hr over 30 Minutes Intravenous Once 11/02/2019 2253 11/22/2019 2256   23-Nov-2019 2200  remdesivir 200 mg in sodium chloride 0.9% 250 mL IVPB       "Followed by" Linked Group Details   200 mg 580 mL/hr over 30 Minutes Intravenous Once 23-Nov-2019 2147 23-Nov-2019 2316      PRN meds: acetaminophen, albuterol, haloperidol lactate, hydrALAZINE, morphine injection, ondansetron (ZOFRAN) IV, phenol   Objective: Vitals:   11/28/2019 0900 11/28/2019 1200  BP: 136/64   Pulse: (!) 105   Resp: 17   Temp:  97.7 F (36.5 C)  SpO2: 90%     Intake/Output Summary (Last 24 hours) at 11/07/2019 1324 Last data filed at 11/10/2019 0615 Gross per 24 hour  Intake --  Output 500 ml  Net -500 ml   Filed Weights   11/18/19  1352 11/21/19 0704 11/22/19 0457  Weight: 82.4 kg 80.5 kg 81.7 kg   Weight change:  Body mass index is 32.94 kg/m.   Physical Exam: General exam: Lethargic, moaning Skin: No rashes, lesions or ulcers. HEENT: Atraumatic, normocephalic, no obvious bleeding Lungs: Diminished respiratory effort CVS: Regular rate and rhythm, no murmur GI/Abd soft, nontender, nondistended, bowel sound present CNS: Able to open eyes on sternal rub, minimal answers.  Dense hemiparesis on the left. Psychiatry: Unable to examine because of altered mentation Extremities: No pedal edema, no calf tenderness  Data Review: I have personally reviewed the laboratory data and studies available.  Recent Labs  Lab 11/17/19 0525 11/17/19 0525 11/18/19 0500 11/18/19 0500 11/19/19 0206 11/20/19 0146 11/21/19 0233 11/22/19 0157 11/01/2019 0205  WBC 11.6*   < > 10.5   < > 10.4 14.3* 19.3* 16.3* 18.2*  NEUTROABS 9.7*  --  8.6*  --  8.9* 12.9* 17.5*  --   --   HGB 12.6   < > 12.9   < > 12.6 12.9  13.6 13.4 13.2  HCT 39.3   < > 39.3   < > 40.1 40.8 42.9 41.2 42.1  MCV 94.2   < > 92.3   < > 93.7 94.2 94.3 92.6 95.0  PLT 260   < > 275   < > 295 313 223 226 180   < > = values in this interval not displayed.   Recent Labs  Lab 11/17/19 0525 11/17/19 0525 11/18/19 0500 11/18/19 0500 11/19/19 0206 11/20/19 0146 11/21/19 0233 11/22/19 0157 11/22/2019 0205  NA 139   < > 142   < > 142 143 140 146* 149*  K 4.2   < > 4.1   < > 3.8 3.8 5.0 4.0 5.2*  CL 103   < > 109   < > 113* 113* 109 113* 117*  CO2 18*   < > 20*   < > 18* 17* 19* 20* 21*  GLUCOSE 127*   < > 130*   < > 150* 146* 170* 195* 178*  BUN 63*   < > 66*   < > 64* 61* 59* 60* 65*  CREATININE 4.42*   < > 2.75*   < > 1.88* 1.66* 1.37* 1.47* 1.42*  CALCIUM 8.4*   < > 8.0*   < > 8.2* 8.4* 8.6* 8.7* 8.6*  MG 1.9  --  1.8  --  1.8 1.9 2.3  --   --    < > = values in this interval not displayed.   Lab Results  Component Value Date   HGBA1C 6.5 (H) 11/22/2019        Component Value Date/Time   CHOL 88 11/22/2019 0157   TRIG 141 11/22/2019 0157   HDL 26 (L) 11/22/2019 0157   CHOLHDL 3.4 11/22/2019 0157   VLDL 28 11/22/2019 0157   LDLCALC 34 11/22/2019 0157   LDLCALC 98 07/13/2018 1002    Signed, Lorin Glass, MD [facility] Pager: [phone] (Secure Chat preferred). 11/01/2019

## 2019-11-29 NOTE — Progress Notes (Signed)
Patient expired at 2137. Daughter notified, and retrieved all belongings earlier today. No heart sounds were auscultated by myself or [facility] RN.

## 2019-11-29 NOTE — Progress Notes (Signed)
NUTRITION NOTE  Full assessment completed remotely by another RD on 8/21. Patient was discussed in rounds yesterday. RD reviewed the chart this AM and also able to talk with RN who is caring for patient today. Plan is for MD to reach out to family again today with possible/likely transition to comfort care.   RN reports that patient is minimally interactive. She reports patient is strictly NPO; 2 gram Na diet entered in the orders so will change this to NPO. RN reports that patient does seem thirsty as patient attempts to suck on oral swab during oral care.   RD will sign off at this time, but if anything changes, please consult RD.      Trenton Gammon, MS, RD, LDN, CNSC Inpatient Clinical Dietitian RD pager # available in [facility]  After hours/weekend pager # available in [facility]

## 2019-11-29 NOTE — Progress Notes (Signed)
Discussed with pt's daughter about moving pt out of ICU. Daughter agreed this would allow for her mother to be more comfortable. MD gave verbal order. Will notify daughter when pt is moved

## 2019-11-29 NOTE — Progress Notes (Signed)
Patient spent majority of the night constantly moaning despite any medication given to help ease her. Just now, patient denies being in pain or uncomfortable, but says she "is just too tired." She was given PRN morphine and remains moaning and inconsolable.

## 2019-11-29 NOTE — Progress Notes (Signed)
Patient's daughter, Marcelino Duster came to visit patient due to end of life and sent home with patient's belongings including wallet, and cellphone.

## 2019-11-29 NOTE — Evaluation (Signed)
SLP Cancellation Note  Patient Details Name: Tracy Massey MRN: [id number] DOB: 18-Feb-1951   Cancelled treatment:       Reason Eval/Treat Not Completed: Other (comment);Medical issues which prohibited therapy (note potential plans for comfort care due to current medical status, will sign off, please reorder if indicated)   Chales Abrahams 11/12/2019, 11:03 AM

## 2019-11-29 DEATH — deceased

## 2020-01-08 ENCOUNTER — Ambulatory Visit: Payer: Medicare HMO | Admitting: Sports Medicine

## 2020-02-13 ENCOUNTER — Ambulatory Visit: Payer: Medicare HMO | Admitting: Family Medicine
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
## 2021-09-17 IMAGING — US US RENAL
2 series · 14 of 19 positions shown · non-contrast
Comparison: Renal ultrasound 07/25/2018.

CLINICAL DATA: 68-year-old female with acute renal injury.

EXAM:
RENAL / URINARY TRACT ULTRASOUND COMPLETE

[Series 1: us renal · 13 of 17 slices shown (1 of 2)]
[im 1/17]
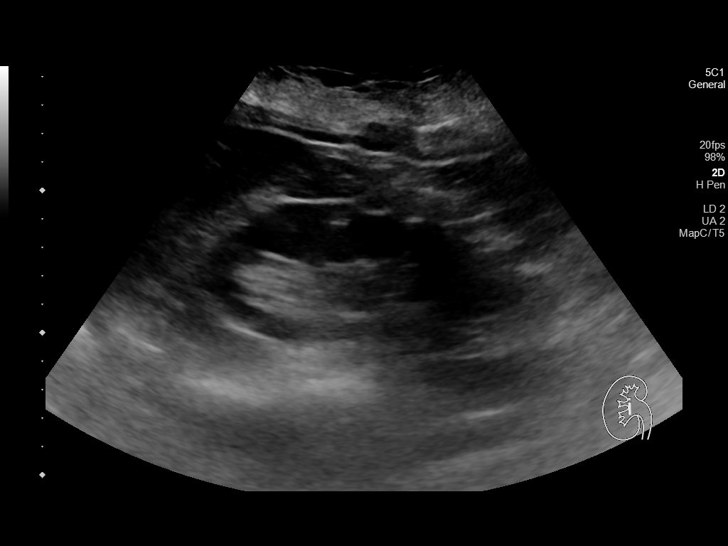
[im 3/17]
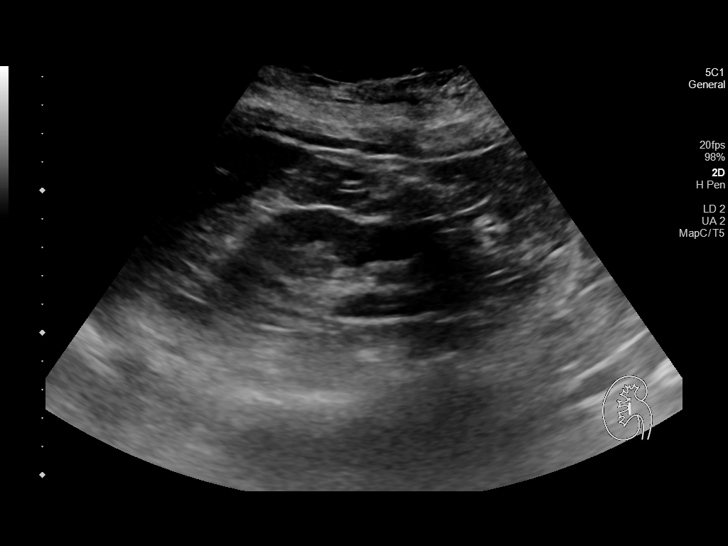
[im 4/17]
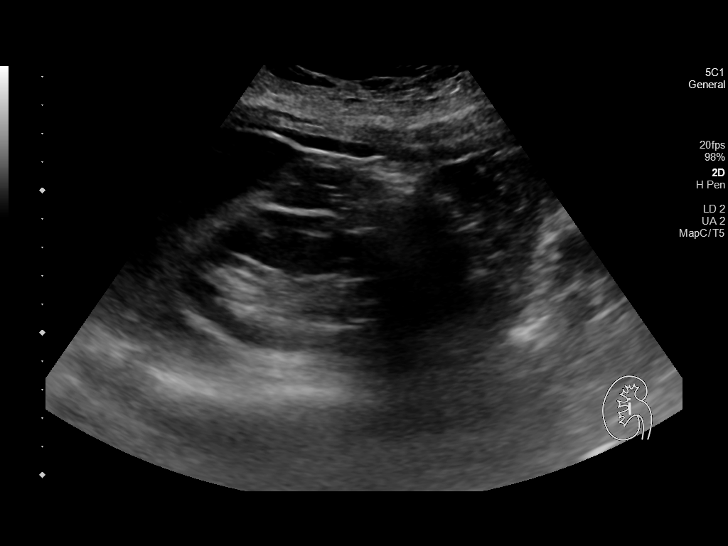
[im 5/17]
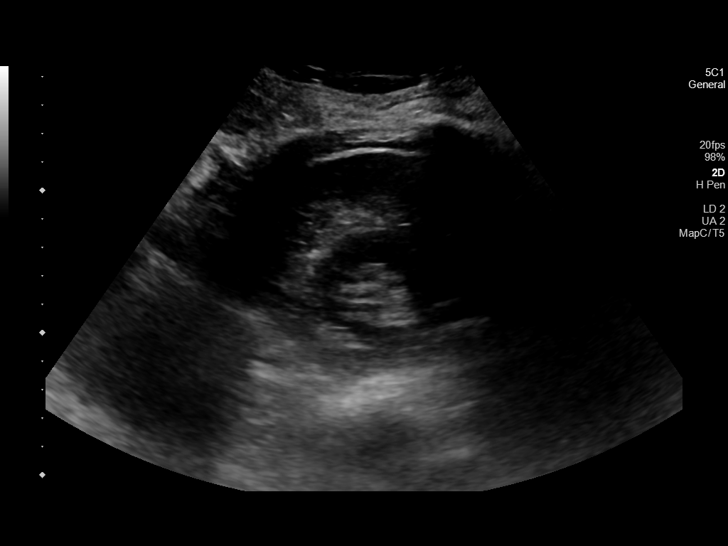
[im 7/17]
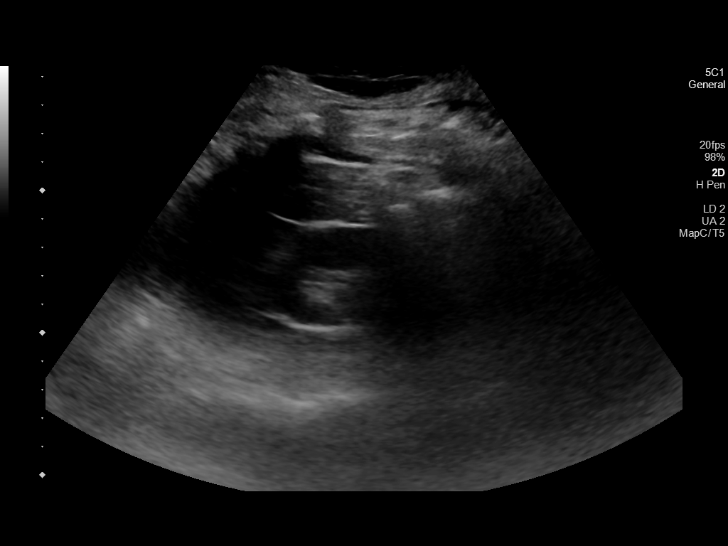
[im 8/17]
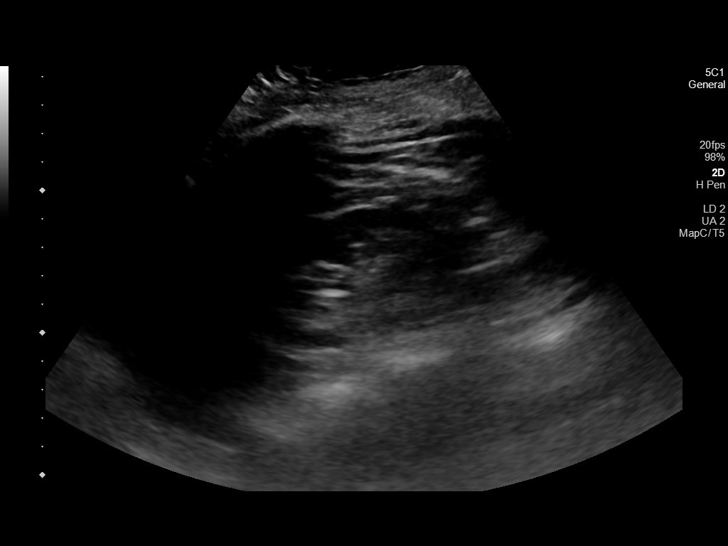
[im 9/17]
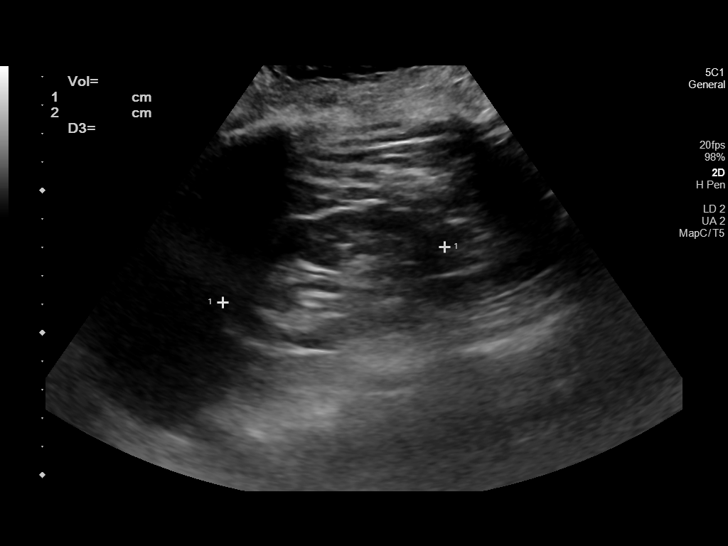
[im 11/17]
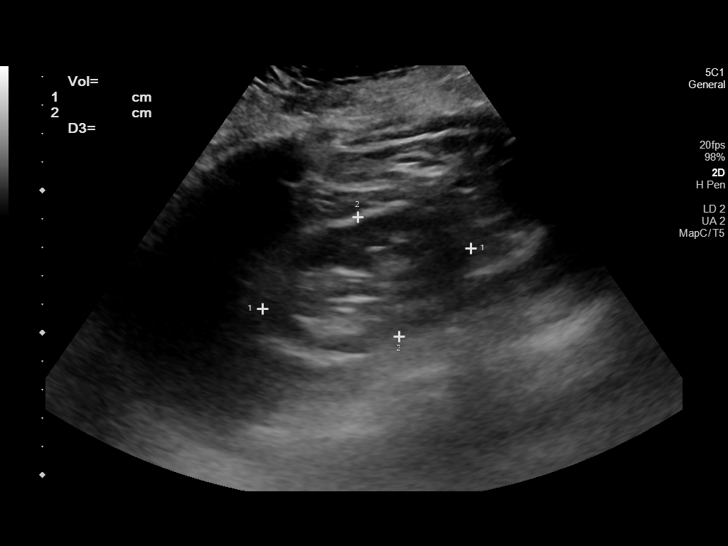
[im 12/17]
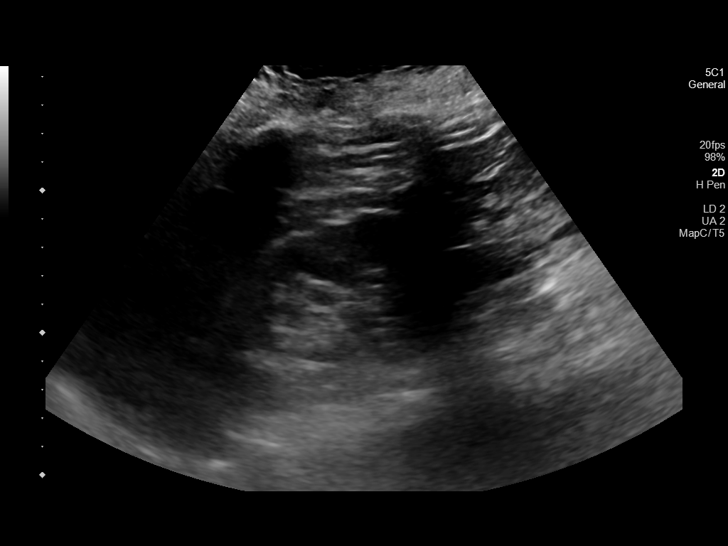
[im 13/17]
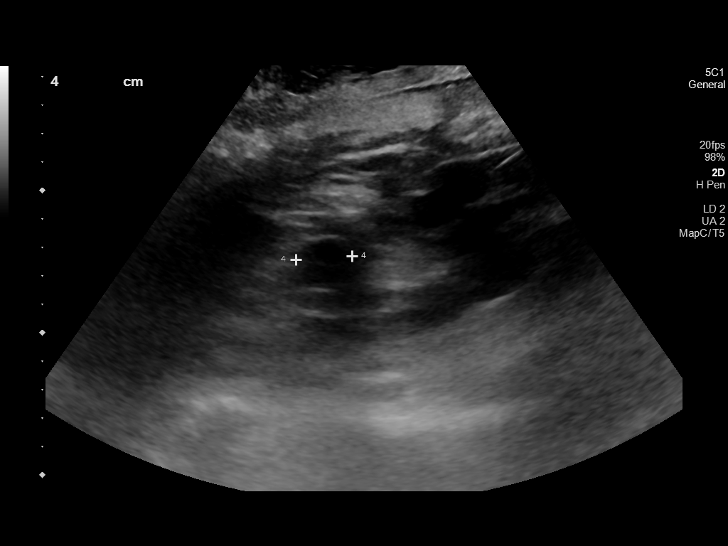
[im 15/17]
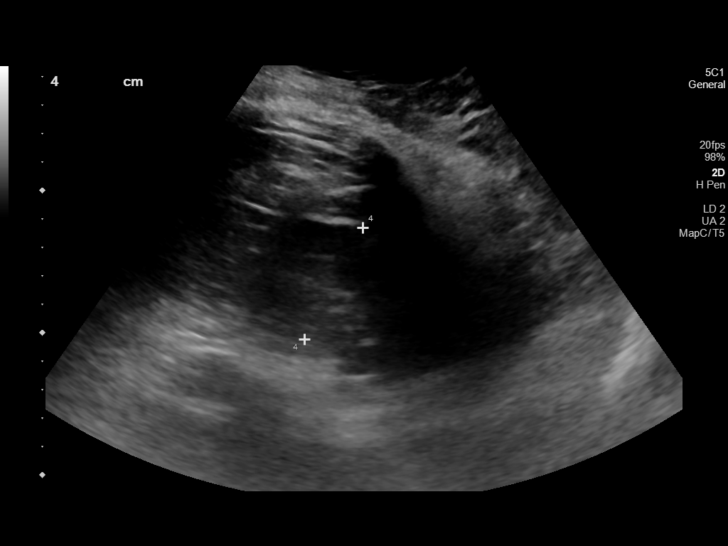
[im 16/17]
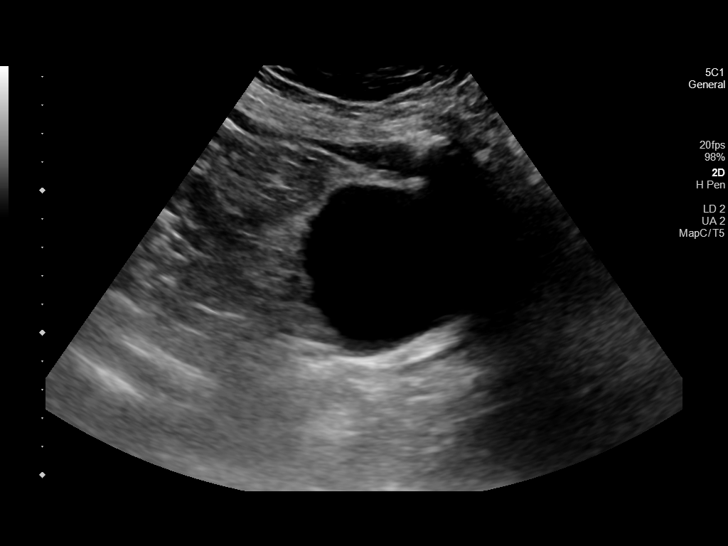
[im 17/17]
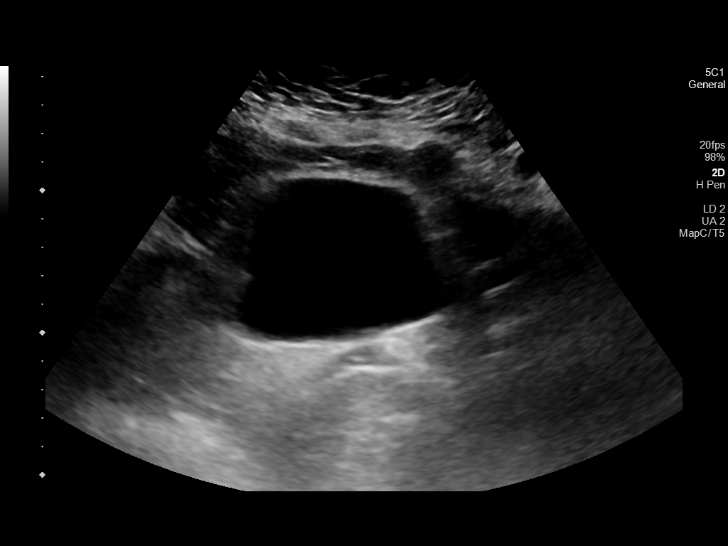

[Series 2: us renal · 1 of 2 slices shown (2 of 2)]
[im 2/2]
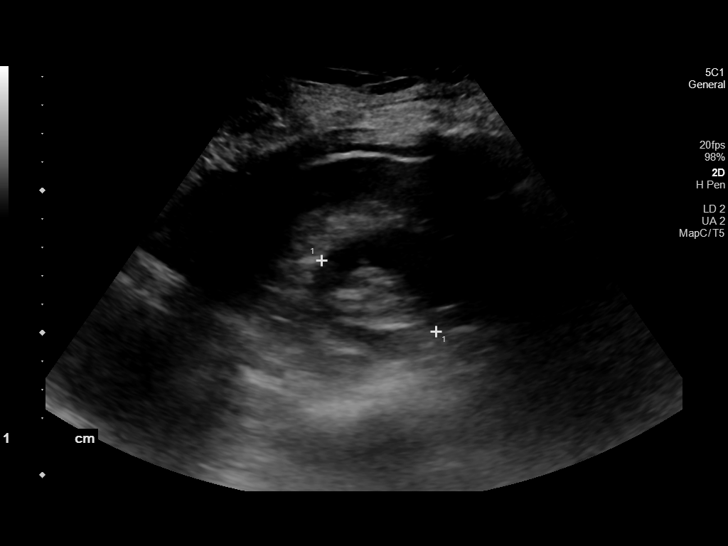

[14 of 19 positions shown; findings below may reference images not displayed]

FINDINGS: Right Kidney:

Renal measurements: 8.7 x 5.2 x 4.7 cm = volume: 106 mL. Cortical
echogenicity within normal limits. Stable cortical thickness. No
right hydronephrosis or renal lesion.

Left Kidney:

Renal measurements: 8.0 x 7.3 x 4.4 cm = volume: 128 mL. Cortical
echogenicity within normal limits. Stable cortical thickness. Benign
appearing 2 cm left renal midpole cyst redemonstrated (image 14). No
hydronephrosis or other left renal lesion.

Bladder:

Appears normal for degree of bladder distention.

Other:

None.
IMPRESSION: 1. No acute renal findings.  Normal bladder.
2. Stable since last year and largely unremarkable for age
ultrasound appearance of both kidneys.
